# Patient Record
Sex: Male | Born: 1960 | Race: White | Hispanic: No | Marital: Married | Smoking: Never smoker
Health system: Southern US, Community
[De-identification: ages and names within clinical notes are randomized; demographics above are authoritative.]

## PROBLEM LIST (undated history)

## (undated) DIAGNOSIS — T7840XA Allergy, unspecified, initial encounter: Secondary | ICD-10-CM

## (undated) DIAGNOSIS — J329 Chronic sinusitis, unspecified: Secondary | ICD-10-CM

## (undated) DIAGNOSIS — G4730 Sleep apnea, unspecified: Secondary | ICD-10-CM

## (undated) DIAGNOSIS — F329 Major depressive disorder, single episode, unspecified: Secondary | ICD-10-CM

## (undated) DIAGNOSIS — E119 Type 2 diabetes mellitus without complications: Secondary | ICD-10-CM

## (undated) DIAGNOSIS — J302 Other seasonal allergic rhinitis: Secondary | ICD-10-CM

## (undated) DIAGNOSIS — E785 Hyperlipidemia, unspecified: Secondary | ICD-10-CM

## (undated) DIAGNOSIS — F32A Depression, unspecified: Secondary | ICD-10-CM

## (undated) DIAGNOSIS — R351 Nocturia: Secondary | ICD-10-CM

## (undated) DIAGNOSIS — R413 Other amnesia: Secondary | ICD-10-CM

## (undated) DIAGNOSIS — L409 Psoriasis, unspecified: Secondary | ICD-10-CM

## (undated) DIAGNOSIS — I2510 Atherosclerotic heart disease of native coronary artery without angina pectoris: Secondary | ICD-10-CM

## (undated) DIAGNOSIS — I10 Essential (primary) hypertension: Secondary | ICD-10-CM

## (undated) DIAGNOSIS — T8859XA Other complications of anesthesia, initial encounter: Secondary | ICD-10-CM

## (undated) DIAGNOSIS — F419 Anxiety disorder, unspecified: Secondary | ICD-10-CM

## (undated) DIAGNOSIS — F909 Attention-deficit hyperactivity disorder, unspecified type: Secondary | ICD-10-CM

## (undated) DIAGNOSIS — T4145XA Adverse effect of unspecified anesthetic, initial encounter: Secondary | ICD-10-CM

## (undated) HISTORY — DX: Hyperlipidemia, unspecified: E78.5

## (undated) HISTORY — PX: KNEE SURGERY: SHX244

## (undated) HISTORY — PX: COLONOSCOPY: SHX174

## (undated) HISTORY — PX: FINGER SURGERY: SHX640

## (undated) HISTORY — PX: CARDIAC CATHETERIZATION: SHX172

## (undated) HISTORY — DX: Essential (primary) hypertension: I10

## (undated) HISTORY — DX: Atherosclerotic heart disease of native coronary artery without angina pectoris: I25.10

## (undated) HISTORY — PX: APPENDECTOMY: SHX54

## (undated) HISTORY — DX: Allergy, unspecified, initial encounter: T78.40XA

## (undated) HISTORY — DX: Sleep apnea, unspecified: G47.30

## (undated) HISTORY — DX: Type 2 diabetes mellitus without complications: E11.9

---

## 1998-09-17 ENCOUNTER — Encounter: Admission: RE | Admit: 1998-09-17 | Discharge: 1998-12-16 | Payer: Self-pay | Admitting: Family Medicine

## 2000-10-29 ENCOUNTER — Emergency Department (HOSPITAL_COMMUNITY): Admission: EM | Admit: 2000-10-29 | Discharge: 2000-10-30 | Payer: Self-pay | Admitting: Emergency Medicine

## 2000-11-01 ENCOUNTER — Emergency Department (HOSPITAL_COMMUNITY): Admission: EM | Admit: 2000-11-01 | Discharge: 2000-11-01 | Payer: Self-pay | Admitting: Emergency Medicine

## 2003-05-14 ENCOUNTER — Encounter: Payer: Self-pay | Admitting: Emergency Medicine

## 2003-05-14 ENCOUNTER — Emergency Department (HOSPITAL_COMMUNITY): Admission: EM | Admit: 2003-05-14 | Discharge: 2003-05-14 | Payer: Self-pay | Admitting: Emergency Medicine

## 2004-05-24 ENCOUNTER — Encounter: Admission: RE | Admit: 2004-05-24 | Discharge: 2004-05-24 | Payer: Self-pay | Admitting: Family Medicine

## 2007-09-27 ENCOUNTER — Ambulatory Visit: Payer: Self-pay | Admitting: Internal Medicine

## 2007-10-28 ENCOUNTER — Observation Stay (HOSPITAL_COMMUNITY): Admission: EM | Admit: 2007-10-28 | Discharge: 2007-10-29 | Payer: Self-pay | Admitting: Emergency Medicine

## 2008-10-13 HISTORY — PX: HERNIA REPAIR: SHX51

## 2009-11-11 ENCOUNTER — Emergency Department (HOSPITAL_COMMUNITY): Admission: EM | Admit: 2009-11-11 | Discharge: 2009-11-12 | Payer: Self-pay | Admitting: Emergency Medicine

## 2010-10-13 HISTORY — PX: KNEE ARTHROSCOPY: SHX127

## 2010-11-02 ENCOUNTER — Encounter: Payer: Self-pay | Admitting: Family Medicine

## 2010-12-29 LAB — URINALYSIS, ROUTINE W REFLEX MICROSCOPIC
Bilirubin Urine: NEGATIVE
Glucose, UA: >1000 mg/dL — AB
Hgb urine dipstick: NEGATIVE
Ketones, ur: NEGATIVE mg/dL
pH: 6 (ref 5.0–8.0)

## 2010-12-29 LAB — GLUCOSE, CAPILLARY: Glucose-Capillary: 196 mg/dL — ABNORMAL HIGH (ref 70–99)

## 2011-02-25 NOTE — Consult Note (Signed)
NAMEMarland Kitchen  Steve [name], Steve [name]                ACCOUNT NO.:  [id number]   MEDICAL RECORD NO.:  [id number]          PATIENT TYPE:  INP   LOCATION:  3702                         FACILITY:  [facility]   PHYSICIAN:  Lyn Records, M.D.   DATE OF BIRTH:  21-Jun-1961   DATE OF CONSULTATION:  10/29/2007  DATE OF DISCHARGE:                                 CONSULTATION   PRIMARY CARE PHYSICIAN:  Oswaldo Conroy Black, DO   CONCLUSIONS:  1. Intermittent sharp left chest discomfort for several weeks without      precipitants, etiology uncertain.  Myocardial infarction has been      ruled out.  The patient also had 1 episode of burning type left      precordial chest discomfort after arrival in the emergency room      yesterday that lasted less than 10 minutes and has not recurred.  2. Left lower quadrant discomfort.  3. Obesity.  4. Diabetes.  5. Hypertension.   RECOMMENDATIONS:  1. No further cardiac evaluation as an inpatient, as EKGs and markers      have all been negative.  We will perform an outpatient stress test      on the patient within the next week or two.  2. Evaluation of left lower quadrant discomfort.   COMMENTS:  The patient is 50 years of age and states that he had a  cardiac catheterization performed with only minimal luminal  irregularities being found when he was 50 years of age.  He states he  began developing a sharp twinges of chest pain in his anterior left  chest over the past several weeks.  He had the same discomfort  yesterday, and he came to the emergency room.  He states that he came to  the emergency room because he was having recurring discomfort in his  left lower quadrant and groin.  He talked about his chest discomfort in  the emergency room after arriving, and he feels that the attention was  diverted from his left lower quadrant to his chest after he mentioned  chest discomfort.  Shortly after arriving in the ER, he complained of  having some burning tightness in his left  chest that lasted less than 20  to 30 minutes.  There was no shortness of breath, diaphoresis, or other  complaints.   ALLERGIES:  SULFA.   MEDICATIONS:  Actos/metformin 15/850 b.i.d., lisinopril 40 mg per day,  Vytorin 10/80 mg per day, and Lantus 30 units q.h.s.   FAMILY HISTORY:  Positive for CAD in the patient's mother and a maternal  uncle.  Both his mother and father have diabetes.   SOCIAL HISTORY:  Does not smoke or drink.   PAST MEDICAL HISTORY:  Hypertension, hyperlipidemia, obesity, and  diabetes.   PHYSICAL EXAMINATION:  GENERAL:  On exam, the patient is in no distress.  VITAL SIGNS:  His blood pressure is 125/72, heart rate is 76, and O2  saturation is 95%.  HEENT:  Unremarkable.  NECK:  No JVD or carotid bruits.  LUNGS:  Clear.  CARDIAC:  Normal.  No murmur, no  rub, no click, and no gallop.  ABDOMEN:  Soft.  No masses are noted.  There is mild tenderness in the  left lower quadrant.  EXTREMITIES:  Reveal no edema.  Posterior tibial pulses are 2+.  NEUROLOGIC:  Unremarkable.   LABORATORY DATA:  Reveals normal BUN and creatinine of 10 and 0.9,  potassium is 4.3, hemoglobin 14.3, CK-MB and troponin-I negative x2.  Chest x-ray is unremarkable.  EKG is normal.   DISCUSSION:  I do not believe the patient is having an acute coronary  syndrome.  He has had various types of chest discomfort, and noted that  may be cardiac, although he does have risk factors.  He does need to  have a myocardial perfusion study performed.  This could be done as an  outpatient, as his symptoms have been stable for the last several weeks.  The more pressing issue to the patient right now is left lower quadrant  discomfort and needs to be evaluated before he can be discharged from  the hospital.      Lyn Records, M.D.  Electronically Signed     HWS/MEDQ  D:  10/29/2007  T:  10/29/2007  Job:  [id number]

## 2011-02-25 NOTE — H&P (Signed)
NAMEJERICHO, [name]                ACCOUNT NO.:  [id number]   MEDICAL RECORD NO.:  [id number]          PATIENT TYPE:  EMS   LOCATION:  MAJO                         FACILITY:  [facility]   PHYSICIAN:  Hollice Espy, M.D.DATE OF BIRTH:  [birth year]   DATE OF ADMISSION:  10/28/2007  DATE OF DISCHARGE:                              HISTORY & PHYSICAL   PRIMARY CARE PHYSICIAN:  Oswaldo Conroy Black, DO of [facility].   CONSULTATIONS:  [facility].   CHIEF COMPLAINT:  Chest pressure.   HISTORY OF PRESENT ILLNESS:  The patient is a 50 year old white male  with a past medical history of obesity, hyperlipidemia, hypertension,  diabetes mellitus, and a positive CAD from a previous cath done 12 years  ago.  He has had intermittent episodes for the past several weeks of  chest pressure.  He describes it as over his left breast, non-radiating  with no associated shortness of breath or dizziness and not really  associated with any exertion but has continued to occur at times but  usually resolves on its own.  However, today when it happened it  appeared to be more persistent and continuous and he became concerned  and came into the emergency room.  His EKG and chest x-ray were  unremarkable as were his cardiac markers.  However, given his multiple  risk factors and previous diagnosis of CAD based on a previous cath, it  was felt best he come in for further evaluation and treatment.  The  patient is otherwise currently doing well.  He complains of some mild  current chest pressure over his left breast.  It is a little bit better  than when he first came in, although he has not received any  interventions, no aspirin, nitroglycerin, or pain medication yet.  He  was brought in by his wife.   REVIEW OF SYSTEMS:  He otherwise denies any headaches, vision changes,  dysphagia, palpitations, shortness of breath, wheeze, cough, abdominal  pain, hematuria, dysuria, constipation, diarrhea, focal extremity  numbness weakness or pain.  Review of systems otherwise negative.   PAST MEDICAL HISTORY:  1. Diabetes mellitus.  2. Hypertension.  3. Obesity.  4. Hyperlipidemia.  5. He underwent a cardiac catheterization 12 years ago and was told      that he does have some plaque but nothing that required stenting at      the time.   MEDICATIONS:  1. Lipitor.  2. Lantus 30 q.h.s.  3. He cannot recall his blood pressure medications.   ALLERGIES:  SULFA.   SOCIAL HISTORY:  He denies any tobacco, alcohol, or drug use.   FAMILY HISTORY:  Notable only for diabetes mellitus.   PHYSICAL EXAMINATION:  VITAL SIGNS:  Temp 97.7, heart rate 89, blood  pressure 151/93, now down to 140/79, respirations 16, O2 sat 98% on room  air.  GENERAL:  He is alert and oriented x3 in no apparent distress.  HEENT:  Normocephalic atraumatic.  His mucous membranes are moist.  He  has no carotid bruits.  HEART:  Regular rate and rhythm.  S1 S2.  LUNGS:  Clear to auscultation bilaterally.  ABDOMEN:  Soft, obese, nontender.  Positive bowel sounds.  EXTREMITIES:  Show no clubbing, cyanosis, or edema.   LABORATORY:  Chest x-ray is unremarkable.  EKG shows a normal sinus  rhythm.  White count is 6.8, H&H is 13.6 and 40, MCV of 85, platelet  count 292, no shift.  UA unremarkable.  Sodium 136, potassium 4.2,  chloride 106, bicarb 22, BUN 10, creatinine 0.9, glucose 157.  CPK 59.3,  MB 1, troponin I less than 0.05.   ASSESSMENT/PLAN:  1. Unstable angina.  The patient provides a very good history plus he      has multiple risk factors and previously documented mild coronary      artery disease.  We will check 2 more sets of cardiac enzymes and      consult [facility].  We will defer to them to decide whether      to do a cardiac catheterization or a stress test.  2. Hyperlipidemia.  Holding oral medications.  3. Diabetes mellitus.  He will be nothing by mouth after midnight, we      will go to sliding scale only.   Resume Lantus after he is eating by      mouth.  4. Hypertension.  5. Obesity.      Hollice Espy, M.D.  Electronically Signed     SKK/MEDQ  D:  10/28/2007  T:  10/28/2007  Job:  [id number]   cc:   [facility]

## 2011-07-04 LAB — HEMOGLOBIN A1C: Hgb A1c MFr Bld: 8.8 — ABNORMAL HIGH

## 2011-07-04 LAB — I-STAT 8, (EC8 V) (CONVERTED LAB)
BUN: 10
Bicarbonate: 21.8
Glucose, Bld: 157 — ABNORMAL HIGH
Operator id: 261381
pCO2, Ven: 28.6 — ABNORMAL LOW
pH, Ven: 7.49 — ABNORMAL HIGH

## 2011-07-04 LAB — URINALYSIS, ROUTINE W REFLEX MICROSCOPIC
Hgb urine dipstick: NEGATIVE
Protein, ur: NEGATIVE
Urobilinogen, UA: 0.2

## 2011-07-04 LAB — CARDIAC PANEL(CRET KIN+CKTOT+MB+TROPI)
CK, MB: 1.8
Relative Index: 1.6
Total CK: 116
Troponin I: 0.01

## 2011-07-04 LAB — CBC
MCV: 85.3
Platelets: 292
WBC: 6.8

## 2011-07-04 LAB — POCT CARDIAC MARKERS
Myoglobin, poc: 59.3
Operator id: 261381

## 2011-07-04 LAB — DIFFERENTIAL
Basophils Relative: 0
Eosinophils Absolute: 0.1
Lymphs Abs: 2.4
Neutrophils Relative %: 57

## 2012-07-26 ENCOUNTER — Ambulatory Visit (INDEPENDENT_AMBULATORY_CARE_PROVIDER_SITE_OTHER): Payer: BC Managed Care – PPO | Admitting: Family

## 2012-07-26 ENCOUNTER — Encounter: Payer: Self-pay | Admitting: Family

## 2012-07-26 VITALS — BP 122/82 | HR 82 | Temp 97.5°F | Resp 14 | Ht 76.0 in | Wt 257.1 lb

## 2012-07-26 DIAGNOSIS — N529 Male erectile dysfunction, unspecified: Secondary | ICD-10-CM

## 2012-07-26 DIAGNOSIS — IMO0002 Reserved for concepts with insufficient information to code with codable children: Secondary | ICD-10-CM

## 2012-07-26 DIAGNOSIS — Z23 Encounter for immunization: Secondary | ICD-10-CM

## 2012-07-26 DIAGNOSIS — E1140 Type 2 diabetes mellitus with diabetic neuropathy, unspecified: Secondary | ICD-10-CM | POA: Insufficient documentation

## 2012-07-26 DIAGNOSIS — R7989 Other specified abnormal findings of blood chemistry: Secondary | ICD-10-CM

## 2012-07-26 DIAGNOSIS — E291 Testicular hypofunction: Secondary | ICD-10-CM

## 2012-07-26 DIAGNOSIS — M1710 Unilateral primary osteoarthritis, unspecified knee: Secondary | ICD-10-CM

## 2012-07-26 DIAGNOSIS — E785 Hyperlipidemia, unspecified: Secondary | ICD-10-CM

## 2012-07-26 DIAGNOSIS — E119 Type 2 diabetes mellitus without complications: Secondary | ICD-10-CM

## 2012-07-26 DIAGNOSIS — M179 Osteoarthritis of knee, unspecified: Secondary | ICD-10-CM | POA: Insufficient documentation

## 2012-07-26 LAB — BASIC METABOLIC PANEL WITH GFR
BUN: 11 mg/dL (ref 6–23)
Chloride: 104 mEq/L (ref 96–112)
Glucose, Bld: 134 mg/dL — ABNORMAL HIGH (ref 70–99)
Potassium: 4.8 mEq/L (ref 3.5–5.3)

## 2012-07-26 LAB — HEPATIC FUNCTION PANEL
ALT: 35 U/L (ref 0–53)
Alkaline Phosphatase: 51 U/L (ref 39–117)
Bilirubin, Direct: 0.1 mg/dL (ref 0.0–0.3)
Indirect Bilirubin: 0.6 mg/dL (ref 0.0–0.9)
Total Protein: 6.6 g/dL (ref 6.0–8.3)

## 2012-07-26 LAB — LIPID PANEL
HDL: 41 mg/dL (ref >39)
LDL Cholesterol: 54 mg/dL (ref 0–99)
VLDL: 42 mg/dL — ABNORMAL HIGH (ref 0–40)

## 2012-07-26 LAB — HEMOGLOBIN A1C: Hgb A1c MFr Bld: 8.6 % — ABNORMAL HIGH (ref <5.7)

## 2012-07-26 MED ORDER — LISINOPRIL 2.5 MG PO TABS: 2.5000 mg | ORAL_TABLET | Freq: Every day | ORAL | Status: DC

## 2012-07-26 NOTE — Assessment & Plan Note (Signed)
Refer to urology.  ?

## 2012-07-26 NOTE — Progress Notes (Signed)
Subjective:    Patient ID: Steve [name], male    DOB: 1961-09-28, 51 y.o.   MRN: [id number]  HPI  Steve [name] is a 51 yr old male here today to establish care.    DM2- Denies hx of pneumovax.  Wants flu shot. He reports that he is generally compliant with insulin:  NPH- 30-35 units twice a day, metformin. Reports this AM was 178.  Generally under 200.  Reports that his sugars have been as low as 42 about 6 months ago.  Last eye exam was about 6 months ago- reports no diabetic retinopathy.    Hyperlipidemia- on simvastatin.  Denies myalgia.    Hypogonadism-He has been on testosterone therapy- receiving injections every 2 weeks- last dose was >1 month ago.    ED-  Reports no improvement with testosterone therapy, minimal response to cialis and viagra.  DJD- left knee.  He is following with Dr. Yisroel Ramming and is being evaluated for TKR. He would like to establish with Dewaine Conger.  Review of Systems  Constitutional: Negative for unexpected weight change.  HENT: Negative for congestion.   Eyes: Negative for visual disturbance.  Respiratory: Negative for cough and shortness of breath.   Cardiovascular: Negative for chest pain and leg swelling.  Gastrointestinal: Negative for nausea, vomiting and diarrhea.  Genitourinary: Negative for dysuria and frequency.  Musculoskeletal:       Knee pain (worse in the left knee pain)  Skin:       Psoriasis scalp  Neurological:       Occasional headaches  Hematological: Negative for adenopathy.  Psychiatric/Behavioral:       Denies depression/anxiety   Past Medical History  Diagnosis Date  . Diabetes mellitus without complication   . Hypertension   . Hyperlipidemia     History   Social History  . Marital Status: Married    Spouse Name: N/A    Number of Children: 6  . Years of Education: N/A   Occupational History  . Not on file.   Social History Main Topics  . Smoking status: Never Smoker   . Smokeless tobacco: Not on file  .  Alcohol Use: 0.6 - 1.2 oz/week    1-2 Cans of beer per week  . Drug Use: No  . Sexually Active: Yes -- Male partner(s)   Other Topics Concern  . Not on file   Social History Narrative   Regular exercise: very littleCaffeine use: sweet tea daily6 childrenWorks in Holiday representative, owns concession.MarriedEnjoys televition.      Past Surgical History  Procedure Date  . Hernia repair 2010    abdominal hernia  . Knee arthroscopy 2012    left knee    Family History  Problem Relation Age of Onset  . Diabetes Mother   . Cancer Mother     history breast cancer  . Hypertension Mother   . Cancer Father     prostate  . Alzheimer's disease Father   . Diabetes Father   . Hypertension Father   . Cancer Maternal Uncle     colon  . Cancer Maternal Grandmother     breast  . Cancer Maternal Grandfather     prostate  . Cancer Maternal Uncle     prostate  . Cancer Maternal Uncle     prostate    Allergies  Allergen Reactions  . Sulfur Itching    Current Outpatient Prescriptions on File Prior to Visit  Medication Sig Dispense Refill  . insulin NPH (NOVOLIN N)  100 UNIT/ML injection Inject 30-35 Units into the skin 2 (two) times daily before a meal.      . metFORMIN (GLUCOPHAGE) 1000 MG tablet Take 1,000 mg by mouth 2 (two) times daily with a meal.      . simvastatin (ZOCOR) 20 MG tablet Take 20 mg by mouth every evening.      . Sitagliptin-Simvastatin 100-20 MG TABS Take 1 tablet by mouth.      Marland Kitchen lisinopril (PRINIVIL,ZESTRIL) 2.5 MG tablet Take 1 tablet (2.5 mg total) by mouth daily.  30 tablet  1    BP 122/82  Pulse 82  Temp 97.5 F (36.4 C) (Oral)  Resp 14  Ht 6\' 4"  (1.93 m)  Wt 257 lb 1.3 oz (116.611 kg)  BMI 31.29 kg/m2  SpO2 98%       Objective:   Physical Exam  Constitutional: He appears well-developed and well-nourished. No distress.  HENT:  Head: Normocephalic and atraumatic.  Cardiovascular: Normal rate and regular rhythm.   No murmur  heard. Pulmonary/Chest: Effort normal and breath sounds normal. No respiratory distress. He has no wheezes. He has no rales. He exhibits no tenderness.  Musculoskeletal: He exhibits no edema.  Lymphadenopathy:    He has no cervical adenopathy.  Psychiatric: He has a normal mood and affect. His behavior is normal. Judgment and thought content normal.          Assessment & Plan:

## 2012-07-26 NOTE — Assessment & Plan Note (Signed)
Obtain A1C, urine microalbumin.  Add low dose ACE, baby aspirin.  Pneumovax and flu shot today.

## 2012-07-26 NOTE — Assessment & Plan Note (Signed)
Refer to Steve [name] at pt request.

## 2012-07-26 NOTE — Patient Instructions (Addendum)
Please complete your blood work prior to leaving. Follow up in 1 month. Welcome to [place]! 

## 2012-07-26 NOTE — Assessment & Plan Note (Signed)
On statin, obtain flp, lft.

## 2012-07-27 ENCOUNTER — Telehealth: Payer: Self-pay | Admitting: Family

## 2012-07-27 LAB — MICROALBUMIN / CREATININE URINE RATIO: Creatinine, Urine: 216 mg/dL

## 2012-07-27 NOTE — Telephone Encounter (Signed)
Per wife: call cell [phone].  Left message requesting call back.  When pt calls back please let him know that his A1C is above goal.  I would like to switch him to lantus at bedtime and give him some meal coverage prior to each meal.  How many meals a day does he eat? Also need to confirm that he is taking NPH 30-35 units twice daily.  Will call him back with dosing.

## 2012-07-28 MED ORDER — INSULIN GLARGINE 100 UNIT/ML ~~LOC~~ SOLN: SUBCUTANEOUS | Status: DC

## 2012-07-28 NOTE — Telephone Encounter (Signed)
I would recommend that he switch from NPH to lantus.  He should take lantus 48 units at bedtime and increase by 3 units every 3 days until his fasting sugar is <110.   Check sugar 2 hours after largest meal of day.  Call me in 1 week with readings.

## 2012-07-28 NOTE — Telephone Encounter (Signed)
Attempted to reach pt and left message to return my call. 

## 2012-07-28 NOTE — Telephone Encounter (Signed)
Notified pt. He confirmed that he is taking 35 units of NPH twice a day and he eats three meals a day.  Please advise.

## 2012-07-30 MED ORDER — INSULIN GLARGINE 100 UNIT/ML ~~LOC~~ SOLN: SUBCUTANEOUS | Status: DC

## 2012-07-30 NOTE — Telephone Encounter (Signed)
Notified pt and his wife of instructions / below and he voices understanding.

## 2012-07-30 NOTE — Addendum Note (Signed)
Addended by: Mervin Kung A on: 07/30/2012 05:28 PM   Modules accepted: Orders

## 2012-07-30 NOTE — Telephone Encounter (Signed)
Pt's wife called requesting lab results. May be reached at [phone].

## 2012-07-30 NOTE — Telephone Encounter (Signed)
Pt's wife called back requesting that we re-send rx to [facility] on [address] as it is more convenient for them today. Rx cancelled at [facility] pharmacy and sent to [facility]. Notified pt's wife, Bonita Quin.

## 2012-07-30 NOTE — Telephone Encounter (Signed)
Liver function, kidney function normal.  A1C is 8.6.  Goal is <7.0.  Triglycerides are slightly elevated, but when sugar improves, this should also improve.

## 2012-08-10 ENCOUNTER — Telehealth: Payer: Self-pay | Admitting: *Deleted

## 2012-08-10 NOTE — Telephone Encounter (Signed)
Received message from pt's wife stating pt is now in the donut hole and is unable to afford Lantus and Juvisync. Wants to know if there are cheaper alternatives?  Please advise.

## 2012-08-11 MED ORDER — SITAGLIPTIN PHOSPHATE 100 MG PO TABS: 100.0000 mg | ORAL_TABLET | Freq: Every day | ORAL | Status: DC

## 2012-08-11 MED ORDER — SIMVASTATIN 40 MG PO TABS: 40.0000 mg | ORAL_TABLET | Freq: Every day | ORAL | Status: DC

## 2012-08-11 NOTE — Telephone Encounter (Signed)
Please call pt and let him know that I have a 1 month supply of Januvia which is med in Juvisync.  I will increase his simvastatin dose to 40mg  once daily to make up for stopping the Juvisync.  I do not have a good alternative for the Lantus- there is no generic.

## 2012-08-11 NOTE — Telephone Encounter (Signed)
Notified pt's wife and she voices understanding. Januvia samples have been placed at front desk for pick up. She reports that pt's plan only pays up to a certain amount per year and he has met the limit. He will now have to pay out of pocket for medications the remainder of the year.

## 2012-08-20 ENCOUNTER — Ambulatory Visit: Payer: BC Managed Care – PPO | Admitting: Family

## 2012-08-24 ENCOUNTER — Ambulatory Visit: Payer: BC Managed Care – PPO | Admitting: Family

## 2012-08-27 ENCOUNTER — Ambulatory Visit (INDEPENDENT_AMBULATORY_CARE_PROVIDER_SITE_OTHER): Payer: BC Managed Care – PPO | Admitting: Family

## 2012-08-27 ENCOUNTER — Encounter: Payer: Self-pay | Admitting: Family

## 2012-08-27 VITALS — BP 140/82 | HR 83 | Temp 98.0°F | Resp 16 | Wt 259.0 lb

## 2012-08-27 DIAGNOSIS — E119 Type 2 diabetes mellitus without complications: Secondary | ICD-10-CM

## 2012-08-27 DIAGNOSIS — Z23 Encounter for immunization: Secondary | ICD-10-CM

## 2012-08-27 DIAGNOSIS — S61209A Unspecified open wound of unspecified finger without damage to nail, initial encounter: Secondary | ICD-10-CM

## 2012-08-27 DIAGNOSIS — S61009A Unspecified open wound of unspecified thumb without damage to nail, initial encounter: Secondary | ICD-10-CM | POA: Insufficient documentation

## 2012-08-27 MED ORDER — CEPHALEXIN 500 MG PO CAPS: 500.0000 mg | ORAL_CAPSULE | Freq: Four times a day (QID) | ORAL | Status: AC

## 2012-08-27 NOTE — Assessment & Plan Note (Signed)
Improving, but not at goal. Recommend titrate lantus up 3 units every 3 days until fasting sugar is <110.

## 2012-08-27 NOTE — Patient Instructions (Addendum)
Increase lantus by 3 units every 3 days until fasting sugar <110. Follow up in 1 week.

## 2012-08-27 NOTE — Progress Notes (Signed)
Subjective:    Patient ID: Steve [name], male    DOB: 05-25-1961, 51 y.o.   MRN: [id number]  HPI  Steve [name] is a 51 yr old male who presents today with injury to left thumb.  Injury occurred on 11/13.  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  The plantar surface of his thumb was crushed and when he removed his glove he reports that the skin was torn off of the plantar surface of his thumb.  He has been dressing the area.  He has been using ibuprofen as needed for pain.   DM2-  On lantus- fasting, generally 170's.  Currently on 48 units of lantus. Tolerating without difficulty. Denies hypoglycemia.   Review of Systems See HPI  Past Medical History  Diagnosis Date  . Diabetes mellitus without complication   . Hypertension   . Hyperlipidemia     History   Social History  . Marital Status: Married    Spouse Name: N/A    Number of Children: 6  . Years of Education: N/A   Occupational History  . Not on file.   Social History Main Topics  . Smoking status: Never Smoker   . Smokeless tobacco: Not on file  . Alcohol Use: 0.6 - 1.2 oz/week    1-2 Cans of beer per week  . Drug Use: No  . Sexually Active: Yes -- Male partner(s)   Other Topics Concern  . Not on file   Social History Narrative   Regular exercise: very littleCaffeine use: sweet tea daily6 childrenWorks in [employer] representative, owns concession.MarriedEnjoys televition.      Past Surgical History  Procedure Date  . Hernia repair 2010    abdominal hernia  . Knee arthroscopy 2012    left knee    Family History  Problem Relation Age of Onset  . Diabetes Mother   . Cancer Mother     history breast cancer  . Hypertension Mother   . Cancer Father     prostate  . Alzheimer's disease Father   . Diabetes Father   . Hypertension Father   . Cancer Maternal Uncle     colon  . Cancer Maternal Grandmother     breast  . Cancer Maternal Grandfather     prostate  . Cancer Maternal  Uncle     prostate  . Cancer Maternal Uncle     prostate    Allergies  Allergen Reactions  . Sulfur Itching    Current Outpatient Prescriptions on File Prior to Visit  Medication Sig Dispense Refill  . aspirin EC 81 MG tablet Take 81 mg by mouth daily.      . insulin glargine (LANTUS) 100 UNIT/ML injection 48 units injected into skin at bedtime.  Titrate dose as instructed.  10 mL  2  . lisinopril (PRINIVIL,ZESTRIL) 2.5 MG tablet Take 1 tablet (2.5 mg total) by mouth daily.  30 tablet  1  . metFORMIN (GLUCOPHAGE) 1000 MG tablet Take 1,000 mg by mouth 2 (two) times daily with a meal.      . naproxen (NAPROSYN) 500 MG tablet Take 500 mg by mouth 2 (two) times daily with a meal.      . simvastatin (ZOCOR) 40 MG tablet Take 1 tablet (40 mg total) by mouth at bedtime.  30 tablet  2  . sitaGLIPtin (JANUVIA) 100 MG tablet Take 1 tablet (100 mg total) by mouth daily.  28 tablet  0  BP 140/82  Pulse 83  Temp 98 F (36.7 C) (Oral)  Resp 16  Wt 259 lb (117.482 kg)  SpO2 96%       Objective:   Physical Exam  Constitutional: He appears well-developed and well-nourished. No distress.  Cardiovascular: Normal rate and regular rhythm.   No murmur heard. Pulmonary/Chest: Effort normal and breath sounds normal. No respiratory distress. He has no wheezes. He has no rales.  Musculoskeletal: He exhibits no edema.  Skin:       Wound noted on palmar surface of left thumb from nail tip down to joint.  Wound is clean with sanguinous drainage- no odor.           Assessment & Plan:

## 2012-08-27 NOTE — Assessment & Plan Note (Signed)
Wound was cleansed with normal saline and dressed with a non-adhesive dressing.  Will rx empiric keflex and give Tdap today.  Recommended continued daily dressing changes and to call if odor, increased drainage/redness.

## 2012-08-30 ENCOUNTER — Ambulatory Visit: Payer: BC Managed Care – PPO | Admitting: Family

## 2012-08-30 ENCOUNTER — Other Ambulatory Visit: Payer: Self-pay | Admitting: *Deleted

## 2012-08-30 MED ORDER — METFORMIN HCL 1000 MG PO TABS: 1000.0000 mg | ORAL_TABLET | Freq: Two times a day (BID) | ORAL | Status: DC

## 2012-08-30 NOTE — Telephone Encounter (Signed)
Received message from [facility] pharmacy that pt is requesting to use their facility and needs new rx for metformin. Refills sent #60 x 2 refills

## 2012-09-03 ENCOUNTER — Ambulatory Visit: Payer: BC Managed Care – PPO | Admitting: Family

## 2012-10-05 ENCOUNTER — Other Ambulatory Visit: Payer: Self-pay | Admitting: Family

## 2012-10-05 NOTE — Telephone Encounter (Signed)
Rx to pharmacy/[facility] 

## 2012-10-11 NOTE — Pre-Procedure Instructions (Signed)
[address]ADES  10/11/2012   Your procedure is scheduled on:  Wednesday October 20, 2012.  Report to [facility] at 0830 AM.  Call this number if you have problems the morning of surgery: [phone]   Remember:   Do not eat food or drink:After Midnight.    Take these medicines the morning of surgery with A SIP OF WATER: NONE  Do not take any diabetic medications including insulins the morning of surgery   Do not wear jewelry, make-up or nail polish.  Do not wear lotions, powders, or perfumes.   . Men may shave face and neck.  Do not bring valuables to the hospital.  Contacts, dentures or bridgework may not be worn into surgery.  Leave suitcase in the car. After surgery it may be brought to your room.  For patients admitted to the hospital, checkout time is 11:00 AM the day of discharge.   Patients discharged the day of surgery will not be allowed to drive home.  Name and phone number of your driver:   Special Instructions: Shower using CHG 2 nights before surgery and the night before surgery.  If you shower the day of surgery use CHG.  Use special wash - you have one bottle of CHG for all showers.  You should use approximately 1/3 of the bottle for each shower.   Please read over the following fact sheets that you were given: Pain Booklet, Coughing and Deep Breathing, Blood Transfusion Information, Total Joint Packet, MRSA Information and Surgical Site Infection Prevention

## 2012-10-12 ENCOUNTER — Other Ambulatory Visit (HOSPITAL_COMMUNITY): Payer: BC Managed Care – PPO

## 2012-10-12 ENCOUNTER — Encounter (HOSPITAL_COMMUNITY)
Admission: RE | Admit: 2012-10-12 | Discharge: 2012-10-12 | Disposition: A | Discharge status: Home / Self Care | Payer: BC Managed Care – PPO | Source: Ambulatory Visit | Attending: Surgery | Admitting: Surgery

## 2012-10-12 ENCOUNTER — Encounter (HOSPITAL_COMMUNITY)
Admission: RE | Admit: 2012-10-12 | Discharge: 2012-10-12 | Disposition: A | Discharge status: Home / Self Care | Payer: BC Managed Care – PPO | Source: Ambulatory Visit | Attending: Orthopedic Surgery | Admitting: Orthopedic Surgery

## 2012-10-12 ENCOUNTER — Encounter (HOSPITAL_COMMUNITY): Payer: Self-pay

## 2012-10-12 HISTORY — DX: Psoriasis, unspecified: L40.9

## 2012-10-12 HISTORY — DX: Other amnesia: R41.3

## 2012-10-12 HISTORY — DX: Nocturia: R35.1

## 2012-10-12 HISTORY — DX: Adverse effect of unspecified anesthetic, initial encounter: T41.45XA

## 2012-10-12 HISTORY — DX: Other seasonal allergic rhinitis: J30.2

## 2012-10-12 HISTORY — DX: Other complications of anesthesia, initial encounter: T88.59XA

## 2012-10-12 LAB — CBC
HCT: 43.2 % (ref 39.0–52.0)
Platelets: 254 10*3/uL (ref 150–400)
RBC: 5.33 MIL/uL (ref 4.22–5.81)
RDW: 13.1 % (ref 11.5–15.5)
WBC: 6 10*3/uL (ref 4.0–10.5)

## 2012-10-12 LAB — URINALYSIS, ROUTINE W REFLEX MICROSCOPIC
Leukocytes, UA: NEGATIVE
Nitrite: NEGATIVE
Specific Gravity, Urine: 1.028 (ref 1.005–1.030)
pH: 5.5 (ref 5.0–8.0)

## 2012-10-12 LAB — COMPREHENSIVE METABOLIC PANEL
AST: 20 U/L (ref 0–37)
Albumin: 4 g/dL (ref 3.5–5.2)
Alkaline Phosphatase: 57 U/L (ref 39–117)
CO2: 24 mEq/L (ref 19–32)
Chloride: 99 mEq/L (ref 96–112)
Potassium: 4.1 mEq/L (ref 3.5–5.1)
Total Bilirubin: 0.4 mg/dL (ref 0.3–1.2)

## 2012-10-12 LAB — ABO/RH: ABO/RH(D): O NEG

## 2012-10-12 LAB — SURGICAL PCR SCREEN: MRSA, PCR: NEGATIVE

## 2012-10-12 LAB — APTT: aPTT: 31 seconds (ref 24–37)

## 2012-10-12 LAB — TYPE AND SCREEN

## 2012-10-12 NOTE — Progress Notes (Signed)
Patient informed Nurse that he had a stress test and cardiac cath over 10 years ago but no PCI. Patient denied having any cardiac issues thereafter. Patient does not currently have a cardiologist, and patient denied having a sleep study. Wife at chair side.

## 2012-10-14 ENCOUNTER — Encounter (HOSPITAL_COMMUNITY): Payer: Self-pay | Admitting: Vascular Surgery

## 2012-10-14 ENCOUNTER — Other Ambulatory Visit: Payer: Self-pay | Admitting: *Deleted

## 2012-10-14 MED ORDER — SITAGLIPTIN PHOSPHATE 100 MG PO TABS: 100.0000 mg | ORAL_TABLET | Freq: Every day | ORAL | Status: DC

## 2012-10-14 NOTE — Telephone Encounter (Signed)
Refill sent to pharmacy for januvia, #30 x 2 refills.

## 2012-10-14 NOTE — Consult Note (Addendum)
Anesthesia chart review: Patient is a 52 year old male scheduled for left total knee replacement by Dr. Eulah Pont on 10/20/2012. History includes obesity, nonsmoker, hypertension, diabetes mellitus type 2, hyperlipidemia, psoriasis, headaches, nocturia.  He reported a cardiac cath greater than 10 years ago that did not require coronary intervention. For his anesthesia history, he reports night terrors after anesthesia and can be violent when waking up.  EKG on 10/12/2012 showed normal sinus rhythm.  Chest x-ray on 10/12/2012 showed minimal right basilar atelectasis with slight elevation of the right hemidiaphragm. The left lung was clear.  Preoperative labs from 10/12/12 noted.    Epic indicate he is scheduled to see his PCP Sandford Craze, NP for medical clearance on 10/18/2012 @ 1000.  I will leave his chart in the follow-up cabinet for review of his clearance note in Epic when available.  (Update 10/20/11 1000: Reviewed Melissa O'Sullivan's note from 10/18/12.  He also had an ED visit on 10/16/12 for left groin pain.  CT was negative for acute pathology.  No hernia or diverticulitis. Diffuse hepatic steatosis.  Pain thought to be musculoskeletal in nature.)  Shonna Chock, PA-C 10/14/12 1205

## 2012-10-16 ENCOUNTER — Encounter (HOSPITAL_COMMUNITY): Payer: Self-pay | Admitting: *Deleted

## 2012-10-16 ENCOUNTER — Emergency Department (HOSPITAL_COMMUNITY)
Admission: EM | Admit: 2012-10-16 | Discharge: 2012-10-16 | Disposition: A | Discharge status: Home / Self Care | Payer: BC Managed Care – PPO | Attending: Emergency Medicine | Admitting: Emergency Medicine

## 2012-10-16 ENCOUNTER — Emergency Department (HOSPITAL_COMMUNITY): Payer: BC Managed Care – PPO

## 2012-10-16 DIAGNOSIS — Z8719 Personal history of other diseases of the digestive system: Secondary | ICD-10-CM | POA: Insufficient documentation

## 2012-10-16 DIAGNOSIS — R000 Tachycardia, unspecified: Secondary | ICD-10-CM | POA: Insufficient documentation

## 2012-10-16 DIAGNOSIS — R509 Fever, unspecified: Secondary | ICD-10-CM | POA: Insufficient documentation

## 2012-10-16 DIAGNOSIS — I10 Essential (primary) hypertension: Secondary | ICD-10-CM | POA: Insufficient documentation

## 2012-10-16 DIAGNOSIS — Z872 Personal history of diseases of the skin and subcutaneous tissue: Secondary | ICD-10-CM | POA: Insufficient documentation

## 2012-10-16 DIAGNOSIS — E119 Type 2 diabetes mellitus without complications: Secondary | ICD-10-CM | POA: Insufficient documentation

## 2012-10-16 DIAGNOSIS — E785 Hyperlipidemia, unspecified: Secondary | ICD-10-CM | POA: Insufficient documentation

## 2012-10-16 DIAGNOSIS — Z794 Long term (current) use of insulin: Secondary | ICD-10-CM | POA: Insufficient documentation

## 2012-10-16 DIAGNOSIS — R109 Unspecified abdominal pain: Secondary | ICD-10-CM

## 2012-10-16 DIAGNOSIS — R1032 Left lower quadrant pain: Secondary | ICD-10-CM | POA: Insufficient documentation

## 2012-10-16 DIAGNOSIS — Z8679 Personal history of other diseases of the circulatory system: Secondary | ICD-10-CM | POA: Insufficient documentation

## 2012-10-16 DIAGNOSIS — Z87448 Personal history of other diseases of urinary system: Secondary | ICD-10-CM | POA: Insufficient documentation

## 2012-10-16 DIAGNOSIS — Z79899 Other long term (current) drug therapy: Secondary | ICD-10-CM | POA: Insufficient documentation

## 2012-10-16 DIAGNOSIS — Z9189 Other specified personal risk factors, not elsewhere classified: Secondary | ICD-10-CM | POA: Insufficient documentation

## 2012-10-16 LAB — HEPATIC FUNCTION PANEL
Albumin: 4.2 g/dL (ref 3.5–5.2)
Alkaline Phosphatase: 61 U/L (ref 39–117)
Total Protein: 7.4 g/dL (ref 6.0–8.3)

## 2012-10-16 LAB — URINE MICROSCOPIC-ADD ON

## 2012-10-16 LAB — CBC WITH DIFFERENTIAL/PLATELET
Eosinophils Absolute: 0.1 10*3/uL (ref 0.0–0.7)
Lymphocytes Relative: 36 % (ref 12–46)
Lymphs Abs: 2.1 10*3/uL (ref 0.7–4.0)
MCH: 27.7 pg (ref 26.0–34.0)
Neutrophils Relative %: 54 % (ref 43–77)
Platelets: 267 10*3/uL (ref 150–400)
RBC: 5.49 MIL/uL (ref 4.22–5.81)
WBC: 5.9 10*3/uL (ref 4.0–10.5)

## 2012-10-16 LAB — URINALYSIS, ROUTINE W REFLEX MICROSCOPIC
Glucose, UA: >1000 mg/dL — AB
Ketones, ur: NEGATIVE mg/dL
Leukocytes, UA: NEGATIVE
pH: 5.5 (ref 5.0–8.0)

## 2012-10-16 LAB — POCT I-STAT, CHEM 8
BUN: 7 mg/dL (ref 6–23)
Chloride: 101 mEq/L (ref 96–112)
Potassium: 4 mEq/L (ref 3.5–5.1)
Sodium: 139 mEq/L (ref 135–145)

## 2012-10-16 MED ORDER — IOHEXOL 300 MG/ML  SOLN
100.0000 mL | Freq: Once | INTRAMUSCULAR | Status: AC | PRN
  Administered 2012-10-16: 100 mL via INTRAVENOUS

## 2012-10-16 MED ORDER — IOHEXOL 300 MG/ML  SOLN
20.0000 mL | INTRAMUSCULAR | Status: DC
  Administered 2012-10-16: 20 mL via ORAL

## 2012-10-16 MED ORDER — HYDROCODONE-ACETAMINOPHEN 5-325 MG PO TABS: 1.0000 | ORAL_TABLET | Freq: Four times a day (QID) | ORAL | Status: DC | PRN

## 2012-10-16 NOTE — ED Notes (Signed)
Pt transported to CT ?

## 2012-10-16 NOTE — ED Provider Notes (Addendum)
History     CSN: [id number]  Arrival date & time 10/16/12  1401   First MD Initiated Contact with Patient 10/16/12 1559      Chief Complaint  Patient presents with  . Groin Pain    (Consider location/radiation/quality/duration/timing/severity/associated sxs/prior treatment) Patient is a 52 y.o. male presenting with groin pain. The history is provided by the patient.  Groin Pain This is a new problem. The current episode started 2 days ago. The problem occurs constantly. The problem has been gradually worsening. Associated symptoms include abdominal pain. Pertinent negatives include no chest pain and no shortness of breath. The symptoms are aggravated by bending, twisting and walking (Lifting the left leg). Nothing relieves the symptoms. He has tried acetaminophen for the symptoms. The treatment provided no relief.    Past Medical History  Diagnosis Date  . Diabetes mellitus without complication   . Hypertension   . Hyperlipidemia   . Complication of anesthesia     has night terrors after anesthesia but can be violent when waking up  . Headache   . 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 Pt stated "I forget alot of things"  . Frequent urination at night     when blood sugar runs high  . Psoriasis of scalp     nose and face; occurs mainly in winter   . Seasonal allergies     Past Surgical History  Procedure Date  . Hernia repair 2010    abdominal hernia  . Knee arthroscopy 2012    left knee  . Cardiac catheterization     no PCI approx 10 years ago  . Finger surgery     right ring finger  . Knee surgery     Family History  Problem Relation Age of Onset  . Diabetes Mother   . Cancer Mother     history breast cancer  . Hypertension Mother   . Cancer Father     prostate  . Alzheimer's disease Father   . Diabetes Father   . Hypertension Father   . Cancer Maternal Uncle     colon  . Cancer Maternal Grandmother     breast  . Cancer Maternal  Grandfather     prostate  . Cancer Maternal Uncle     prostate  . Cancer Maternal Uncle     prostate    History  Substance Use Topics  . Smoking status: Never Smoker   . Smokeless tobacco: Not on file  . Alcohol Use: 0.6 - 1.2 oz/week    1-2 Cans of beer per week      Review of Systems  Constitutional: Positive for fever. Negative for chills.  Respiratory: Negative for shortness of breath.   Cardiovascular: Negative for chest pain.  Gastrointestinal: Positive for abdominal pain. Negative for nausea, vomiting and diarrhea.  All other systems reviewed and are negative.    Allergies  Sulfur  Home Medications   Current Outpatient Rx  Name  Route  Sig  Dispense  Refill  . ACETAMINOPHEN 500 MG PO TABS   Oral   Take 1,000 mg by mouth 2 (two) times daily as needed. For pain         . FEXOFENADINE-PSEUDOEPHED ER 180-240 MG PO TB24   Oral   Take 1 tablet by mouth daily.         . INSULIN GLARGINE 100 UNIT/ML [place] SOLN   Subcutaneous   Inject 35-40 Units into the skin every  morning.          Marland Kitchen LISINOPRIL 2.5 MG PO TABS   Oral   Take 2.5 mg by mouth daily.         Marland Kitchen METFORMIN HCL 1000 MG PO TABS   Oral   Take 1 tablet (1,000 mg total) by mouth 2 (two) times daily with a meal.   60 tablet   2   . ADULT MULTIVITAMIN W/MINERALS CH   Oral   Take 1 tablet by mouth daily.         Marland Kitchen SIMVASTATIN 40 MG PO TABS   Oral   Take 1 tablet (40 mg total) by mouth at bedtime.   30 tablet   2   . SITAGLIPTIN PHOSPHATE 100 MG PO TABS   Oral   Take 1 tablet (100 mg total) by mouth daily.   30 tablet   2     BP 161/93  Pulse 120  Temp 97.9 F (36.6 C) (Oral)  Resp 18  SpO2 94%  Physical Exam  Nursing note and vitals reviewed. Constitutional: He is oriented to person, place, and time. He appears well-developed and well-nourished. No distress.  HENT:  Head: Normocephalic and atraumatic.  Mouth/Throat: Oropharynx is clear and moist.  Eyes: Conjunctivae normal  and EOM are normal. Pupils are equal, round, and reactive to light.  Neck: Normal range of motion. Neck supple.  Cardiovascular: Regular rhythm and intact distal pulses.  Tachycardia present.   No murmur heard. Pulmonary/Chest: Effort normal and breath sounds normal. No respiratory distress. He has no wheezes. He has no rales.  Abdominal: Soft. Normal appearance. He exhibits no distension. There is tenderness. There is no rebound and no guarding.         No left inguinal hernia palpated however tenderness in the canal  Musculoskeletal: Normal range of motion. He exhibits no edema and no tenderness.  Neurological: He is alert and oriented to person, place, and time.  Skin: Skin is warm and dry. No rash noted. No erythema.  Psychiatric: He has a normal mood and affect. His behavior is normal.    ED Course  Procedures (including critical care time)  Labs Reviewed  URINALYSIS, ROUTINE W REFLEX MICROSCOPIC - Abnormal; Notable for the following:    Glucose, UA >1000 (*)     All other components within normal limits  POCT I-STAT, CHEM 8 - Abnormal; Notable for the following:    Glucose, Bld 262 (*)     All other components within normal limits  CBC WITH DIFFERENTIAL  HEPATIC FUNCTION PANEL  URINE MICROSCOPIC-ADD ON   Ct Abdomen Pelvis W Contrast  10/16/2012  *RADIOLOGY REPORT*  Clinical Data: Left lower quadrant abdominal and inguinal pain. Clinical concern for inguinal hernia or diverticulitis.  Previous hernia repair.  CT ABDOMEN AND PELVIS WITH CONTRAST  Technique:  Multidetector CT imaging of the abdomen and pelvis was performed following the standard protocol during bolus administration of intravenous contrast.  Contrast: OMNIPAQUE IOHEXOL 300 MG/ML  SOLN  Comparison: 10/29/2007.  Findings: Diffuse low density of the liver relative to the spleen. Normal appearing spleen, pancreas, adrenal glands, kidneys, urinary bladder and prostate gland.  Poorly distended gallbladder, grossly  normal.  No gastrointestinal abnormalities or enlarged lymph nodes. No hernia demonstrated.  Clear lung bases.  Lower thoracic spine degenerative changes.  IMPRESSION:  1.  No acute abnormality.  No hernia demonstrated and no evidence of diverticulitis. 2.  Diffuse hepatic steatosis.   Original Report Authenticated By: Beckie Salts,  M.D.      1. Abdominal wall pain       MDM   Patient with left lower quadrant and groin pain that started approximately 2 days ago. It is worse with standing, moving his left lower extremity. He has a history of bilateral inguinal hernia repair several years ago. On exam no palpable hernia of the pain in the inguinal canal also pain in the left lower quadrant. He is neurovascularly intact with a normal pulse in the left lower extremity. No signs of swelling or concern for DVT. UA within normal limits with low suspicion for kidney stone.  CBC, CMP within normal limits. Will do a CT to further evaluate for possible incarcerated inguinal hernia through the mesh versus other etiology.  6:22 PM CT negative for acute pathology. Must be musculoskeletal. Will give pain control patient will followup with his PCP.      Gwyneth Sprout, MD 10/16/12 4782  Gwyneth Sprout, MD 10/16/12 9562

## 2012-10-16 NOTE — ED Notes (Addendum)
Lt. Lower groin pain. Here on wed. For total knee lt. Replacement. Took tylenol for the pain. Only thing he can take now.

## 2012-10-16 NOTE — ED Notes (Signed)
CT notified pt done drinking contrast and ready for CT.

## 2012-10-18 ENCOUNTER — Ambulatory Visit (HOSPITAL_BASED_OUTPATIENT_CLINIC_OR_DEPARTMENT_OTHER)
Admission: RE | Admit: 2012-10-18 | Discharge: 2012-10-18 | Disposition: A | Discharge status: Home / Self Care | Payer: BC Managed Care – PPO | Source: Ambulatory Visit | Attending: Family | Admitting: Family

## 2012-10-18 ENCOUNTER — Encounter: Payer: Self-pay | Admitting: Family

## 2012-10-18 ENCOUNTER — Ambulatory Visit (INDEPENDENT_AMBULATORY_CARE_PROVIDER_SITE_OTHER): Payer: BC Managed Care – PPO | Admitting: Family

## 2012-10-18 VITALS — BP 134/90 | HR 87 | Temp 97.8°F | Resp 16 | Ht 76.0 in | Wt 264.1 lb

## 2012-10-18 DIAGNOSIS — IMO0002 Reserved for concepts with insufficient information to code with codable children: Secondary | ICD-10-CM

## 2012-10-18 DIAGNOSIS — N529 Male erectile dysfunction, unspecified: Secondary | ICD-10-CM

## 2012-10-18 DIAGNOSIS — I10 Essential (primary) hypertension: Secondary | ICD-10-CM | POA: Insufficient documentation

## 2012-10-18 DIAGNOSIS — M1710 Unilateral primary osteoarthritis, unspecified knee: Secondary | ICD-10-CM

## 2012-10-18 DIAGNOSIS — Z01818 Encounter for other preprocedural examination: Secondary | ICD-10-CM

## 2012-10-18 DIAGNOSIS — E119 Type 2 diabetes mellitus without complications: Secondary | ICD-10-CM

## 2012-10-18 DIAGNOSIS — Z01811 Encounter for preprocedural respiratory examination: Secondary | ICD-10-CM | POA: Insufficient documentation

## 2012-10-18 LAB — PROTIME-INR
INR: 0.93 (ref <1.50)
Prothrombin Time: 12.5 seconds (ref 11.6–15.2)

## 2012-10-18 MED ORDER — INSULIN GLARGINE 100 UNIT/ML ~~LOC~~ SOLN: 35.0000 [IU] | Freq: Every morning | SUBCUTANEOUS | Status: DC

## 2012-10-18 NOTE — Assessment & Plan Note (Addendum)
Requested refill on [facility]- I have asked him to request refill from urology.

## 2012-10-18 NOTE — Assessment & Plan Note (Signed)
Pt to resume metformin tonight.

## 2012-10-18 NOTE — Patient Instructions (Addendum)
Please complete your lab work prior to leaving.  Good luck with your upcoming surgery.

## 2012-10-18 NOTE — Assessment & Plan Note (Signed)
He is scheduled for TKR on Wednesday.  He had lab work and EKG performed in the ED which I have reviewed.  Will also add PT/INR.

## 2012-10-18 NOTE — Progress Notes (Signed)
Monday 1630   F/U note placed in chart from M. Peggyann Juba...Marland KitchenDA

## 2012-10-18 NOTE — Progress Notes (Signed)
Subjective:    Patient ID: Steve [name], male    DOB: [birth year], 52 y.o.   MRN: [id number]  HPI  Left lower abdominal pain- throbbing, worse with movement.  Denies burning, frequency, fever.  He was given rx for vicodin which did not help his pain. Was seen in the ED on Saturday and underwent CT of the abdomen.  CT showed no acute changes and it was felt to be musculoskeletal in nature.   TKR-  Pt is scheduled to have left TKR on Wednesday of this week.  Pt to resume metformin tonight.  This was on hold due to his IV contrast given in ED.    Review of Systems See HPI    Past Medical History  Diagnosis Date  . Diabetes mellitus without complication   . Hypertension   . Hyperlipidemia   . Complication of anesthesia     has night terrors after anesthesia but can be violent when waking up  . Headache   . 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 Pt stated "I forget alot of things"  . Frequent urination at night     when blood sugar runs high  . Psoriasis of scalp     nose and face; occurs mainly in winter   . Seasonal allergies     History   Social History  . Marital Status: Married    Spouse Name: N/A    Number of Children: 6  . Years of Education: N/A   Occupational History  . Not on file.   Social History Main Topics  . Smoking status: Never Smoker   . Smokeless tobacco: Not on file  . Alcohol Use: 0.6 - 1.2 oz/week    1-2 Cans of beer per week  . Drug Use: No  . Sexually Active: Yes -- Male partner(s)   Other Topics Concern  . Not on file   Social History Narrative   Regular exercise: very littleCaffeine use: sweet tea daily6 childrenWorks in Holiday representative, owns concession.MarriedEnjoys televition.      Past Surgical History  Procedure Date  . Hernia repair 2010    abdominal hernia  . Knee arthroscopy 2012    left knee  . Cardiac catheterization     no PCI approx 10 years ago  . Finger surgery     right ring finger  . Knee surgery      Family History  Problem Relation Age of Onset  . Diabetes Mother   . Cancer Mother     history breast cancer  . Hypertension Mother   . Cancer Father     prostate  . Alzheimer's disease Father   . Diabetes Father   . Hypertension Father   . Cancer Maternal Uncle     colon  . Cancer Maternal Grandmother     breast  . Cancer Maternal Grandfather     prostate  . Cancer Maternal Uncle     prostate  . Cancer Maternal Uncle     prostate    Allergies  Allergen Reactions  . Sulfur Itching    Current Outpatient Prescriptions on File Prior to Visit  Medication Sig Dispense Refill  . acetaminophen (TYLENOL) 500 MG tablet Take 1,000 mg by mouth 2 (two) times daily as needed. For pain      . fexofenadine-pseudoephedrine (ALLEGRA-D 24) 180-240 MG per 24 hr tablet Take 1 tablet by mouth daily.      Marland Kitchen HYDROcodone-acetaminophen (NORCO/VICODIN) 5-325 MG per tablet  Take 1 tablet by mouth every 6 (six) hours as needed for pain.  10 tablet  0  . insulin glargine (LANTUS) 100 UNIT/ML injection Inject 35-40 Units into the skin every morning.       Marland Kitchen lisinopril (PRINIVIL,ZESTRIL) 2.5 MG tablet Take 2.5 mg by mouth daily.      . Multiple Vitamin (MULTIVITAMIN WITH MINERALS) TABS Take 1 tablet by mouth daily.      . simvastatin (ZOCOR) 40 MG tablet Take 1 tablet (40 mg total) by mouth at bedtime.  30 tablet  2  . sitaGLIPtin (JANUVIA) 100 MG tablet Take 1 tablet (100 mg total) by mouth daily.  30 tablet  2  . metFORMIN (GLUCOPHAGE) 1000 MG tablet Take 1 tablet (1,000 mg total) by mouth 2 (two) times daily with a meal.  60 tablet  2    BP 134/90  Pulse 87  Temp 97.8 F (36.6 C) (Oral)  Resp 16  Ht 6\' 4"  (1.93 m)  Wt 264 lb 1.3 oz (119.786 kg)  BMI 32.14 kg/m2  SpO2 97%    Objective:   Physical Exam  Constitutional: He appears well-developed and well-nourished. No distress.  Cardiovascular: Normal rate and regular rhythm.   No murmur heard. Pulmonary/Chest: Effort normal and  breath sounds normal. No respiratory distress. He has no wheezes. He has no rales. He exhibits no tenderness.  Abdominal: Soft. Bowel sounds are normal. He exhibits no distension and no mass. There is no tenderness. There is no rebound and no guarding.  Musculoskeletal:       + swelling of the left knee  Psychiatric: He has a normal mood and affect. His behavior is normal. Judgment and thought content normal.          Assessment & Plan:

## 2012-10-19 ENCOUNTER — Encounter (HOSPITAL_COMMUNITY): Payer: Self-pay | Admitting: *Deleted

## 2012-10-19 ENCOUNTER — Telehealth: Payer: Self-pay | Admitting: Family

## 2012-10-19 ENCOUNTER — Encounter: Payer: Self-pay | Admitting: Family

## 2012-10-19 ENCOUNTER — Emergency Department (HOSPITAL_COMMUNITY)
Admission: EM | Admit: 2012-10-19 | Discharge: 2012-10-19 | Disposition: A | Discharge status: Home / Self Care | Payer: BC Managed Care – PPO | Attending: Emergency Medicine | Admitting: Emergency Medicine

## 2012-10-19 DIAGNOSIS — R21 Rash and other nonspecific skin eruption: Secondary | ICD-10-CM | POA: Insufficient documentation

## 2012-10-19 DIAGNOSIS — Z79899 Other long term (current) drug therapy: Secondary | ICD-10-CM | POA: Insufficient documentation

## 2012-10-19 DIAGNOSIS — B029 Zoster without complications: Secondary | ICD-10-CM | POA: Insufficient documentation

## 2012-10-19 DIAGNOSIS — M79609 Pain in unspecified limb: Secondary | ICD-10-CM | POA: Insufficient documentation

## 2012-10-19 DIAGNOSIS — Z87448 Personal history of other diseases of urinary system: Secondary | ICD-10-CM | POA: Insufficient documentation

## 2012-10-19 DIAGNOSIS — E119 Type 2 diabetes mellitus without complications: Secondary | ICD-10-CM | POA: Insufficient documentation

## 2012-10-19 DIAGNOSIS — Z8659 Personal history of other mental and behavioral disorders: Secondary | ICD-10-CM | POA: Insufficient documentation

## 2012-10-19 DIAGNOSIS — I10 Essential (primary) hypertension: Secondary | ICD-10-CM | POA: Insufficient documentation

## 2012-10-19 MED ORDER — OXYCODONE-ACETAMINOPHEN 5-325 MG PO TABS
2.0000 | ORAL_TABLET | Freq: Once | ORAL | Status: AC
  Administered 2012-10-19: 2 via ORAL
  Filled 2012-10-19: qty 2

## 2012-10-19 MED ORDER — VALACYCLOVIR HCL 1 G PO TABS: 1000.0000 mg | ORAL_TABLET | Freq: Three times a day (TID) | ORAL | Status: AC

## 2012-10-19 MED ORDER — OXYCODONE-ACETAMINOPHEN 5-325 MG PO TABS: 1.0000 | ORAL_TABLET | Freq: Four times a day (QID) | ORAL | Status: DC | PRN

## 2012-10-19 MED ORDER — CEFAZOLIN SODIUM-DEXTROSE 2-3 GM-% IV SOLR: 2.0000 g | INTRAVENOUS | Status: DC

## 2012-10-19 NOTE — ED Provider Notes (Signed)
History     CSN: [id number]  Arrival date & time 10/19/12  1209   First MD Initiated Contact with Patient 10/19/12 1220      Chief Complaint  Patient presents with  . Back Pain  . Leg Pain    (Consider location/radiation/quality/duration/timing/severity/associated sxs/prior treatment) Patient is a 52 y.o. male presenting with back pain and leg pain. The history is provided by the patient.  Back Pain  Associated symptoms include leg pain. Pertinent negatives include no chest pain, no fever, no headaches and no abdominal pain.  Leg Pain   pt c/o pain to left groin, and left medial thigh for past 3 days. In past 1 day has noted a erythematous lesion to medial, proximal aspect left thigh. No hx same. No other rash/lesions. No nv. No fever or chills. Pain constant. No specific exacerbating or alleviating factors. Pain mod/severe.       Past Medical History  Diagnosis Date  . Diabetes mellitus without complication   . Hypertension   . Hyperlipidemia   . Complication of anesthesia     has night terrors after anesthesia but can be violent when waking up  . Headache   . 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 Pt stated "I forget alot of things"  . Frequent urination at night     when blood sugar runs high  . Psoriasis of scalp     nose and face; occurs mainly in winter   . Seasonal allergies     Past Surgical History  Procedure Date  . Hernia repair 2010    abdominal hernia  . Knee arthroscopy 2012    left knee  . Cardiac catheterization     no PCI approx 10 years ago  . Finger surgery     right ring finger  . Knee surgery     Family History  Problem Relation Age of Onset  . Diabetes Mother   . Cancer Mother     history breast cancer  . Hypertension Mother   . Cancer Father     prostate  . Alzheimer's disease Father   . Diabetes Father   . Hypertension Father   . Cancer Maternal Uncle     colon  . Cancer Maternal Grandmother     breast  .  Cancer Maternal Grandfather     prostate  . Cancer Maternal Uncle     prostate  . Cancer Maternal Uncle     prostate    History  Substance Use Topics  . Smoking status: Never Smoker   . Smokeless tobacco: Not on file  . Alcohol Use: 0.6 - 1.2 oz/week    1-2 Cans of beer per week      Review of Systems  Constitutional: Negative for fever.  HENT: Negative for neck pain.   Eyes: Negative for redness.  Respiratory: Negative for shortness of breath.   Cardiovascular: Negative for chest pain.  Gastrointestinal: Negative for abdominal pain.  Genitourinary: Negative for flank pain.  Musculoskeletal: Positive for back pain.  Skin: Positive for rash.  Neurological: Negative for headaches.  Hematological: Does not bruise/bleed easily.  Psychiatric/Behavioral: Negative for confusion.    Allergies  Sulfur  Home Medications   Current Outpatient Rx  Name  Route  Sig  Dispense  Refill  . ACETAMINOPHEN 500 MG PO TABS   Oral   Take 1,000 mg by mouth 2 (two) times daily as needed. For pain         .  FEXOFENADINE-PSEUDOEPHED ER 180-240 MG PO TB24   Oral   Take 1 tablet by mouth daily.         Marland Kitchen HYDROCODONE-ACETAMINOPHEN 5-325 MG PO TABS   Oral   Take 1 tablet by mouth every 6 (six) hours as needed.         . INSULIN GLARGINE 100 UNIT/ML [place] SOLN   Subcutaneous   Inject 35-40 Units into the skin at bedtime. Sliding scale         . LISINOPRIL 2.5 MG PO TABS   Oral   Take 2.5 mg by mouth daily.         Marland Kitchen METFORMIN HCL 1000 MG PO TABS   Oral   Take 1 tablet (1,000 mg total) by mouth 2 (two) times daily with a meal.   60 tablet   2   . ADULT MULTIVITAMIN W/MINERALS CH   Oral   Take 1 tablet by mouth daily.         Marland Kitchen SIMVASTATIN 40 MG PO TABS   Oral   Take 1 tablet (40 mg total) by mouth at bedtime.   30 tablet   2   . SITAGLIPTIN PHOSPHATE 100 MG PO TABS   Oral   Take 1 tablet (100 mg total) by mouth daily.   30 tablet   2     BP 157/90  Pulse 98   Temp 97.7 F (36.5 C) (Oral)  Resp 18  SpO2 97%  Physical Exam  Nursing note and vitals reviewed. Constitutional: He is oriented to person, place, and time. He appears well-developed and well-nourished. No distress.  HENT:  Head: Atraumatic.  Eyes: Pupils are equal, round, and reactive to light.  Neck: Neck supple. No tracheal deviation present.  Cardiovascular: Normal rate.   Pulmonary/Chest: Effort normal. No accessory muscle usage. No respiratory distress.  Abdominal: Soft. Bowel sounds are normal. He exhibits no distension. There is no tenderness.  Musculoskeletal: Normal range of motion. He exhibits no edema and no tenderness.  Neurological: He is alert and oriented to person, place, and time.  Skin: Skin is warm and dry.       Erythematous, vesicular rash single dermatone, left proximal thigh, c/w shingles. No cellulitis. Distal pulses palp.   Psychiatric: He has a normal mood and affect.    ED Course  Procedures (including critical care time)  Labs Reviewed - No data to display Dg Chest 2 View  10/18/2012  *RADIOLOGY REPORT*  Clinical Data: Preoperative respiratory exam for orthopedic surgery.  History of hypertension and diabetes.  CHEST - 2 VIEW  Comparison: 10/12/2012  Findings: Heart size is normal.  Mediastinal shadows are normal. Lungs are clear.  No effusions.  No bony abnormalities.  IMPRESSION: Normal chest.   Original Report Authenticated By: Paulina Fusi, M.D.        MDM  Percocet 2 po. Pt has ride, does not have to drive.  Pt states lesions/rash just started in past day, therefore will rx w valtrex and pain medication (pt w normal renal fxn on recent labs).          Suzi Roots, MD 10/19/12 1253

## 2012-10-19 NOTE — Telephone Encounter (Signed)
I see that he was diagnosed with shingles today.  His surgery should be postponed until lesions are resolved.  Please notify pt and surgeon's office.  He should follow up with me in 10 days please.

## 2012-10-19 NOTE — H&P (Signed)
  [facility] [address]   [address] [place], [place] [address] [phone] A Division of [facility]  Loreta Ave, M.D.   Robert A. Thurston Hole, M.D.   Burnell Blanks, M.D.   Eulas Post, M.D.   Lunette Stands, M.D Buford Dresser, M.D.  Charlsie Quest, M.D.   Estell Harpin, M.D.   Melina Fiddler, M.D. Genene Churn. Barry Dienes, PA-C            Kirstin A. Shepperson, PA-C Josh [place], PA-C [place], [place]   RE: Steve [name], Steve [name]                                [phone]      DOB: [birth year] PROGRESS NOTE: 10-12-12 87 nine year-old white male with a history of end stage DJD, left knee, and pain.  Returns.  States that knee symptoms are unchanged from previous visit.  He is wanting to proceed with left total knee replacement scheduled.   Current medications: Metformin, Simvastatin, insulin and Naproxen. Allergies: Sulfa. Past medical/surgical history: Diabetes, hypercholesterolemia, left knee arthroscopy x 2, hernia repair and hypertension. Review of systems: Patient admits to having several headaches over the last week or so.  Denies history of migraines.  No fevers, chills, chest pain, shortness of breath, GI or GU issues.  Family history: Positive for diabetes and hypertension.   Social history: Denies smoking, admits occasional alcohol use.  Patient is married and self-employed doing Holiday representative.    EXAMINATION: Height: 6?4.  Weight: 264 pounds.  Respirations: 20.  Temperature: 98.1.  Blood pressure: 131/84.  Pulse: 97.  Pleasant white male, alert and oriented x 3 and in no acute distress.  Gait is antalgic.  No increase in respiratory effort. Head is normocephalic, a traumatic.  PERRLA, EOMI.  Lungs: CTA bilaterally.  No wheezes.  Heart: RRR.  No murmurs.  Abdomen: Round and non-distended.  NBS x 4.  Soft and non-tender.  Left knee: Decreased range of motion.  Positive effusion.  Positive crepitus.  Joint line  tender.  Ligaments stable.  Calf non-tender.  Neurovascularly intact.  Skin warm and dry.    X-RAYS: Previous left knee films show end stage DJD with periarticular spurs.    IMPRESSION: End stage DJD, left knee, and chronic pain.  PLAN: We will proceed with left total knee replacement as scheduled.  Surgical procedure, along with potential rehab/recovery time discussed.  All questions answered.  In regards to his headaches, I advised him to discuss this with Sandford Craze, Family Nurse Practitioner.  Hopefully he will have this checked out before his surgery.  All questions answered.    Genene Churn. Barry Dienes, PA-C   Electronically verified by Loreta Ave, M.D. JMO:jjh Cc: Sandford Craze, FNP, fax: [phone] D 10-12-12 T 10-14-12

## 2012-10-19 NOTE — ED Notes (Signed)
Pt is here with lower back pain and left upper thigh and groin pain and was seen here on Saturday for same symptoms.  Pt has an area inside his left thigh that he thinks may be a spider bite

## 2012-10-19 NOTE — Telephone Encounter (Signed)
Spoke with Dr Eulah Pont. "Shingles appears to be up on pt's thigh, there doesn't appear to be any super imposed bacterial infection". Per Dr Eulah Pont, pt has started Valtrex and wishes to proceed with surgery; anesthesia has cleared pt for surgery. Dr Eulah Pont will see pt in the office tomorrow and if outbreak has not worsened he will proceed with surgery.

## 2012-10-19 NOTE — ED Notes (Signed)
Vesicular-appearing rash noted to left groin extending onto medial and dorsal aspects of left thigh areas; pt endorses pain to LLQ of abd rad into left thigh; ambulates with minimal difficulty

## 2012-10-20 ENCOUNTER — Encounter (HOSPITAL_COMMUNITY)
Admission: RE | Disposition: A | Discharge status: Home / Self Care | Payer: Self-pay | Source: Ambulatory Visit | Attending: Orthopedic Surgery

## 2012-10-20 ENCOUNTER — Encounter (HOSPITAL_COMMUNITY)
Admission: RE | Admit: 2012-10-20 | Discharge: 2012-10-20 | Disposition: A | Discharge status: Home / Self Care | Payer: BC Managed Care – PPO | Source: Ambulatory Visit | Attending: Orthopedic Surgery | Admitting: Orthopedic Surgery

## 2012-10-20 SURGERY — CANCELLED PROCEDURE
Laterality: Left

## 2012-10-20 SURGICAL SUPPLY — 54 items
BANDAGE ESMARK 6X9 LF (GAUZE/BANDAGES/DRESSINGS) ×1 IMPLANT
BLADE SAG 18X100X1.27 (BLADE) ×4 IMPLANT
BNDG ESMARK 6X9 LF (GAUZE/BANDAGES/DRESSINGS) ×2
BOOTCOVER CLEANROOM LRG (PROTECTIVE WEAR) ×4 IMPLANT
BOWL SMART MIX CTS (DISPOSABLE) ×2 IMPLANT
CLOTH BEACON ORANGE TIMEOUT ST (SAFETY) ×2 IMPLANT
COVER BACK TABLE 24X17X13 BIG (DRAPES) ×2 IMPLANT
COVER SURGICAL LIGHT HANDLE (MISCELLANEOUS) ×2 IMPLANT
CUFF TOURNIQUET SINGLE 34IN LL (TOURNIQUET CUFF) ×2 IMPLANT
DRAPE EXTREMITY T 121X128X90 (DRAPE) ×2 IMPLANT
DRAPE PROXIMA HALF (DRAPES) ×2 IMPLANT
DRAPE U-SHAPE 47X51 STRL (DRAPES) ×2 IMPLANT
DRSG PAD ABDOMINAL 8X10 ST (GAUZE/BANDAGES/DRESSINGS) ×2 IMPLANT
DURAPREP 26ML APPLICATOR (WOUND CARE) ×2 IMPLANT
ELECT CAUTERY BLADE 6.4 (BLADE) ×2 IMPLANT
ELECT REM PT RETURN 9FT ADLT (ELECTROSURGICAL) ×2
ELECTRODE REM PT RTRN 9FT ADLT (ELECTROSURGICAL) ×1 IMPLANT
EVACUATOR 1/8 PVC DRAIN (DRAIN) ×2 IMPLANT
FACESHIELD LNG OPTICON STERILE (SAFETY) ×2 IMPLANT
GAUZE XEROFORM 5X9 LF (GAUZE/BANDAGES/DRESSINGS) ×2 IMPLANT
GLOVE BIOGEL PI IND STRL 8 (GLOVE) ×1 IMPLANT
GLOVE BIOGEL PI INDICATOR 8 (GLOVE) ×1
GLOVE ORTHO TXT STRL SZ7.5 (GLOVE) ×2 IMPLANT
GOWN PREVENTION PLUS XLARGE (GOWN DISPOSABLE) ×4 IMPLANT
GOWN STRL NON-REIN LRG LVL3 (GOWN DISPOSABLE) ×4 IMPLANT
GOWN STRL REIN 2XL XLG LVL4 (GOWN DISPOSABLE) ×2 IMPLANT
HANDPIECE INTERPULSE COAX TIP (DISPOSABLE) ×1
IMMOBILIZER KNEE 22 UNIV (SOFTGOODS) ×2 IMPLANT
IMMOBILIZER KNEE 24 THIGH 36 (MISCELLANEOUS) IMPLANT
IMMOBILIZER KNEE 24 UNIV (MISCELLANEOUS)
KIT BASIN OR (CUSTOM PROCEDURE TRAY) ×2 IMPLANT
KIT ROOM TURNOVER OR (KITS) ×2 IMPLANT
MANIFOLD NEPTUNE II (INSTRUMENTS) ×2 IMPLANT
NS IRRIG 1000ML POUR BTL (IV SOLUTION) ×2 IMPLANT
PACK TOTAL JOINT (CUSTOM PROCEDURE TRAY) ×2 IMPLANT
PAD ARMBOARD 7.5X6 YLW CONV (MISCELLANEOUS) ×4 IMPLANT
PAD CAST 4YDX4 CTTN HI CHSV (CAST SUPPLIES) ×1 IMPLANT
PADDING CAST COTTON 4X4 STRL (CAST SUPPLIES) ×1
PADDING CAST COTTON 6X4 STRL (CAST SUPPLIES) ×2 IMPLANT
RUBBERBAND STERILE (MISCELLANEOUS) ×2 IMPLANT
SET HNDPC FAN SPRY TIP SCT (DISPOSABLE) ×1 IMPLANT
SPONGE GAUZE 4X4 12PLY (GAUZE/BANDAGES/DRESSINGS) ×2 IMPLANT
STAPLER VISISTAT 35W (STAPLE) ×2 IMPLANT
SUCTION FRAZIER TIP 10 FR DISP (SUCTIONS) ×2 IMPLANT
SUT VIC AB 1 CTX 36 (SUTURE) ×2
SUT VIC AB 1 CTX36XBRD ANBCTR (SUTURE) ×2 IMPLANT
SUT VIC AB 2-0 CT1 27 (SUTURE) ×2
SUT VIC AB 2-0 CT1 TAPERPNT 27 (SUTURE) ×2 IMPLANT
SYR 30ML LL (SYRINGE) ×2 IMPLANT
SYR 30ML SLIP (SYRINGE) ×2 IMPLANT
TOWEL OR 17X24 6PK STRL BLUE (TOWEL DISPOSABLE) ×2 IMPLANT
TOWEL OR 17X26 10 PK STRL BLUE (TOWEL DISPOSABLE) ×2 IMPLANT
TRAY FOLEY CATH 14FR (SET/KITS/TRAYS/PACK) ×2 IMPLANT
WATER STERILE IRR 1000ML POUR (IV SOLUTION) ×6 IMPLANT

## 2012-10-20 NOTE — Interval H&P Note (Signed)
History and Physical Interval Note:  10/20/2012 8:22 AM  Steve [name]  has presented today for surgery, with the diagnosis of DJD LEFT KNEE  The various methods of treatment have been discussed with the patient and family. After consideration of risks, benefits and other options for treatment, the patient has consented to  Procedure(s) (LRB) with comments: TOTAL KNEE ARTHROPLASTY (Left) as a surgical intervention .  The patient's history has been reviewed, patient examined, no change in status, stable for surgery.  I have reviewed the patient's chart and labs.  Questions were answered to the patient's satisfaction.     MURPHY,DANIEL F

## 2012-11-02 ENCOUNTER — Encounter: Payer: Self-pay | Admitting: Family

## 2012-11-02 ENCOUNTER — Ambulatory Visit (INDEPENDENT_AMBULATORY_CARE_PROVIDER_SITE_OTHER): Payer: BC Managed Care – PPO | Admitting: Family

## 2012-11-02 VITALS — BP 120/72 | HR 112 | Temp 98.1°F | Resp 16 | Wt 258.1 lb

## 2012-11-02 DIAGNOSIS — B029 Zoster without complications: Secondary | ICD-10-CM

## 2012-11-02 MED ORDER — GABAPENTIN 100 MG PO CAPS: 100.0000 mg | ORAL_CAPSULE | Freq: Three times a day (TID) | ORAL | Status: DC

## 2012-11-02 NOTE — Patient Instructions (Addendum)
Please call if pain worsens or if no improvement in pain with gabapentin. You can try Capsaicin cream which you can purchase over the counter for the leg rash/pain. Try hydrocortisone 1% as needed for skin rash. Follow up in 6 weeks.

## 2012-11-02 NOTE — Progress Notes (Signed)
Subjective:    Patient ID: Steve [name], male    DOB: 1961-05-16, 52 y.o.   MRN: [id number]  HPI  Steve [name] is a 52 yr old male who presents today for follow up of his Herpes Zoster.  He reports that oxycodone helps the most.  He continues to have healing rash and burning pain in the affected area. Knee surgery was postponed due to pain.  Review of Systems See HPI  Past Medical History  Diagnosis Date  . Diabetes mellitus without complication   . Hypertension   . Hyperlipidemia   . Complication of anesthesia     has night terrors after anesthesia but can be violent when waking up  . Headache   . 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 Pt stated "I forget alot of things"  . Frequent urination at night     when blood sugar runs high  . Psoriasis of scalp     nose and face; occurs mainly in winter   . Seasonal allergies     History   Social History  . Marital Status: Married    Spouse Name: N/A    Number of Children: 6  . Years of Education: N/A   Occupational History  . Not on file.   Social History Main Topics  . Smoking status: Never Smoker   . Smokeless tobacco: Not on file  . Alcohol Use: 0.6 - 1.2 oz/week    1-2 Cans of beer per week  . Drug Use: No  . Sexually Active: Yes -- Male partner(s)   Other Topics Concern  . Not on file   Social History Narrative   Regular exercise: very littleCaffeine use: sweet tea daily6 childrenWorks in Holiday representative, owns concession.MarriedEnjoys televition.      Past Surgical History  Procedure Date  . Hernia repair 2010    abdominal hernia  . Knee arthroscopy 2012    left knee  . Cardiac catheterization     no PCI approx 10 years ago  . Finger surgery     right ring finger  . Knee surgery     Family History  Problem Relation Age of Onset  . Diabetes Mother   . Cancer Mother     history breast cancer  . Hypertension Mother   . Cancer Father     prostate  . Alzheimer's disease Father   .  Diabetes Father   . Hypertension Father   . Cancer Maternal Uncle     colon  . Cancer Maternal Grandmother     breast  . Cancer Maternal Grandfather     prostate  . Cancer Maternal Uncle     prostate  . Cancer Maternal Uncle     prostate    Allergies  Allergen Reactions  . Sulfur Itching    Current Outpatient Prescriptions on File Prior to Visit  Medication Sig Dispense Refill  . acetaminophen (TYLENOL) 500 MG tablet Take 1,000 mg by mouth 2 (two) times daily as needed. For pain      . fexofenadine-pseudoephedrine (ALLEGRA-D 24) 180-240 MG per 24 hr tablet Take 1 tablet by mouth daily.      . insulin glargine (LANTUS) 100 UNIT/ML injection Inject 35-40 Units into the skin at bedtime. Sliding scale      . lisinopril (PRINIVIL,ZESTRIL) 2.5 MG tablet Take 2.5 mg by mouth daily.      . metFORMIN (GLUCOPHAGE) 1000 MG tablet Take 1 tablet (1,000 mg total) by mouth  2 (two) times daily with a meal.  60 tablet  2  . Multiple Vitamin (MULTIVITAMIN WITH MINERALS) TABS Take 1 tablet by mouth daily.      Marland Kitchen oxyCODONE-acetaminophen (PERCOCET/ROXICET) 5-325 MG per tablet Take 1-2 tablets by mouth every 6 (six) hours as needed for pain.  30 tablet  0  . simvastatin (ZOCOR) 40 MG tablet Take 1 tablet (40 mg total) by mouth at bedtime.  30 tablet  2  . sitaGLIPtin (JANUVIA) 100 MG tablet Take 1 tablet (100 mg total) by mouth daily.  30 tablet  2  . valACYclovir (VALTREX) 1000 MG tablet Take 1 tablet (1,000 mg total) by mouth 3 (three) times daily.  21 tablet  0  . gabapentin (NEURONTIN) 100 MG capsule Take 1 capsule (100 mg total) by mouth 3 (three) times daily.  90 capsule  2    BP 120/72  Pulse 112  Temp 98.1 F (36.7 C) (Oral)  Resp 16  Wt 258 lb 1.3 oz (117.064 kg)  SpO2 96%       Objective:   Physical Exam  Constitutional: He appears well-developed and well-nourished. No distress.  Cardiovascular: Normal rate and regular rhythm.   Pulmonary/Chest: Effort normal and breath sounds  normal.          Assessment & Plan:

## 2012-11-05 DIAGNOSIS — B029 Zoster without complications: Secondary | ICD-10-CM | POA: Insufficient documentation

## 2012-11-05 NOTE — Assessment & Plan Note (Signed)
Resolving, but now having some post-herpetic neuralgia. Recommended trial of neurontin and capsaicin cream.

## 2012-11-13 NOTE — Pre-Procedure Instructions (Signed)
ARIANNA DELSANTO  11/13/2012   Your procedure is scheduled on:  11/24/2012  Report to [facility] at 9:45 AM.  Call this number if you have problems the morning of surgery: [phone]   Remember:   Do not eat food or drink liquids after midnight.  On TUESDAY   Take these medicines the morning of surgery with A SIP OF WATER:gabapentin, pain med, NO BLOOD SUGAR MED IR INSULIN AM OF SURG.    Do not wear jewelry  Do not wear lotions, powders, or perfumes. You may NOT wear deodorant.            Men may shave face and neck.  Do not bring valuables to the hospital.  Contacts, dentures or bridgework may not be worn into surgery.  Leave suitcase in the car. After surgery it may be brought to your room.  For patients admitted to the hospital, checkout time is 11:00 AM the day of  discharge.   Patients discharged the day of surgery will not be allowed to drive  home.  Name and phone number of your driver:  Special Instructions: Shower using CHG 2 nights before surgery and the night before surgery.  If you shower the day of surgery use CHG.  Use special wash - you have one bottle of CHG for all showers.  You should use approximately 1/3 of the bottle for each shower. INCENTIVE SPIROMETRY INST SHEET   Please read over the following fact sheets that you were given: Pain Booklet, Coughing and Deep Breathing, Blood Transfusion Information, MRSA Information and Surgical Site Infection Prevention

## 2012-11-15 ENCOUNTER — Encounter (HOSPITAL_COMMUNITY)
Admission: RE | Admit: 2012-11-15 | Discharge: 2012-11-15 | Disposition: A | Discharge status: Home / Self Care | Payer: BC Managed Care – PPO | Source: Ambulatory Visit | Attending: Orthopedic Surgery | Admitting: Orthopedic Surgery

## 2012-11-15 NOTE — Progress Notes (Addendum)
CALLED OFFICE TALKED WITH JAMES PA FOR ORDERS. PATIENT DID NOT SHOW FOR PREADMIT CALLED HIM, STATED WAS GOING TO PUT OFF SURGERY FOR A MONTH. CALLED JAMES PA AT OFFICE AND TOLD HIM.

## 2012-11-17 ENCOUNTER — Ambulatory Visit (INDEPENDENT_AMBULATORY_CARE_PROVIDER_SITE_OTHER): Payer: BC Managed Care – PPO | Admitting: Internal Medicine

## 2012-11-17 ENCOUNTER — Encounter: Payer: Self-pay | Admitting: Internal Medicine

## 2012-11-17 VITALS — BP 136/90 | HR 103 | Temp 97.9°F | Wt 259.0 lb

## 2012-11-17 DIAGNOSIS — J209 Acute bronchitis, unspecified: Secondary | ICD-10-CM

## 2012-11-17 MED ORDER — HYDROCODONE-HOMATROPINE 5-1.5 MG/5ML PO SYRP: 5.0000 mL | ORAL_SOLUTION | Freq: Four times a day (QID) | ORAL | Status: DC | PRN

## 2012-11-17 MED ORDER — AMOXICILLIN 500 MG PO CAPS: 500.0000 mg | ORAL_CAPSULE | Freq: Three times a day (TID) | ORAL | Status: DC

## 2012-11-17 NOTE — Patient Instructions (Addendum)
Plain Mucinex (NOT D) for thick secretions ;force NON dairy fluids .   Nasal cleansing in the shower as discussed with lather of mild shampoo.After 10 seconds wash off lather while  exhaling through nostrils. Make sure that all residual soap is removed to prevent irritation.  Use a Neti pot daily only  as needed for significant sinus congestion; going from open side to congested side . Plain Allegra (NOT D )  160 daily , Loratidine 10 mg , OR Zyrtec 10 mg @ bedtime  as needed for itchy eyes & sneezing.     

## 2012-11-17 NOTE — Progress Notes (Signed)
  Subjective:    Patient ID: Steve [name], male    DOB: 10-Jun-1961, 52 y.o.   MRN: [id number]  HPI The respiratory tract symptoms began 11/13/12 as  head congestion &  cough with  Green- yellow   sputum.  His wife has been ill with similar illness  Significant active  associated symptoms include frontal headache, facial pain, & slight sore throat.Cough is  associated with  Green sputum & shortness of breath but not wheezing .  Flu shot  Current. Treatment with  Mucinex & Nyquil was partially effective   There is no history of asthma ; PMH of  perennial allergies. The patient had never smoked .  He states his diabetic control has been "good" with fasting blood sugars averaging 190 x 3 days ; typically 130-150. Previously sugars have been 200-300                 Review of Systems Symptoms not present include  dental pain,  nasal purulence, earache , and otic discharge Fever,chills & sweats not present  Itchy , watery eyes & sneezing were not noted. Myalgias and arthralgias were not present        Objective:   Physical Exam General appearance:well nourished; no acute distress or increased work of breathing is present.  No  lymphadenopathy about the head, neck, or axilla noted.  Eyes: No conjunctival inflammation or lid edema is present.  Ears:  External ear exam shows no significant lesions or deformities.  Otoscopic examination reveals clear canals, tympanic membranes are intact bilaterally without bulging, retraction, inflammation or discharge. Minor scarring right TM inferiorly Nose:  External nasal examination shows no deformity or inflammation. Nasal mucosa are pink and moist without lesions or exudates. No septal dislocation or deviation.No obstruction to airflow.  Oral exam: Dental hygiene is fair; lips and gums are healthy appearing.There is no oropharyngeal erythema or exudate noted.  Neck:  No deformities,  masses, or tenderness noted.    Heart:  Normal rate and  regular rhythm. S1 and S2 normal without gallop, murmur, click, rub or other extra sounds.  Lungs:Chest clear to auscultation; no wheezes, rhonchi,rales ,or rubs present.No increased work of breathing.   Extremities:  No cyanosis, edema, or clubbing  noted . 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        Assessment & Plan:  #1 acute bronchitis w/o bronchospasm Plan: See orders and recommendations

## 2012-11-24 ENCOUNTER — Inpatient Hospital Stay (HOSPITAL_COMMUNITY)
Admission: RE | Admit: 2012-11-24 | Payer: BC Managed Care – PPO | Source: Ambulatory Visit | Admitting: Orthopedic Surgery

## 2012-11-24 ENCOUNTER — Encounter (HOSPITAL_COMMUNITY): Admission: RE | Payer: Self-pay | Source: Ambulatory Visit

## 2012-11-24 SURGERY — ARTHROPLASTY, KNEE, TOTAL
Anesthesia: General | Laterality: Left

## 2012-12-03 ENCOUNTER — Other Ambulatory Visit: Payer: Self-pay | Admitting: Family

## 2013-02-16 ENCOUNTER — Other Ambulatory Visit: Payer: Self-pay | Admitting: Family

## 2013-02-16 NOTE — Telephone Encounter (Signed)
Rx request to pharmacy/SLS  

## 2013-03-18 ENCOUNTER — Other Ambulatory Visit: Payer: Self-pay | Admitting: Family

## 2013-04-11 ENCOUNTER — Ambulatory Visit (INDEPENDENT_AMBULATORY_CARE_PROVIDER_SITE_OTHER): Payer: BC Managed Care – PPO | Admitting: Family

## 2013-04-11 ENCOUNTER — Encounter: Payer: BC Managed Care – PPO | Admitting: Family

## 2013-04-11 ENCOUNTER — Encounter: Payer: Self-pay | Admitting: Family

## 2013-04-11 VITALS — BP 140/98 | HR 104 | Temp 98.6°F | Resp 18 | Ht 75.0 in | Wt 254.1 lb

## 2013-04-11 DIAGNOSIS — E119 Type 2 diabetes mellitus without complications: Secondary | ICD-10-CM

## 2013-04-11 DIAGNOSIS — Z0000 Encounter for general adult medical examination without abnormal findings: Secondary | ICD-10-CM

## 2013-04-11 DIAGNOSIS — E785 Hyperlipidemia, unspecified: Secondary | ICD-10-CM

## 2013-04-11 LAB — CBC WITH DIFFERENTIAL/PLATELET
Basophils Absolute: 0 10*3/uL (ref 0.0–0.1)
Basophils Relative: 0 % (ref 0–1)
Lymphocytes Relative: 31 % (ref 12–46)
MCHC: 33.9 g/dL (ref 30.0–36.0)
Monocytes Absolute: 0.4 10*3/uL (ref 0.1–1.0)
Neutro Abs: 3.9 10*3/uL (ref 1.7–7.7)
Neutrophils Relative %: 61 % (ref 43–77)
Platelets: 332 10*3/uL (ref 150–400)
RDW: 14.4 % (ref 11.5–15.5)
WBC: 6.4 10*3/uL (ref 4.0–10.5)

## 2013-04-11 LAB — HEMOGLOBIN A1C: Hgb A1c MFr Bld: 7.8 % — ABNORMAL HIGH (ref <5.7)

## 2013-04-11 MED ORDER — SITAGLIPTIN PHOSPHATE 100 MG PO TABS: 100.0000 mg | ORAL_TABLET | Freq: Every day | ORAL | Status: DC

## 2013-04-11 NOTE — Progress Notes (Signed)
Subjective:    Patient ID: Steve [name], male    DOB: March 15, 1961, 52 y.o.   MRN: [id number]  HPI  Patient presents today for complete physical.  Immunizations: up to date Diet: reports healthy diet Exercise: works in [employer], no cardio Colonoscopy: due  DM2- reports that his sugars are under 200.  Using lantus- 30 units HS and metformin and januvia.    Hyperlipidemia-  On simvastatin- denies myalgia.    Review of Systems  Constitutional: Negative for unexpected weight change.  HENT: Negative for congestion.   Eyes: Negative for visual disturbance.  Respiratory: Negative for cough.   Cardiovascular: Negative for chest pain.  Gastrointestinal: Negative for vomiting, constipation and blood in stool.  Genitourinary: Negative for dysuria and frequency.  Musculoskeletal: Negative for myalgias and arthralgias.  Skin: Negative for rash.  Neurological: Negative for headaches.  Hematological: Negative for adenopathy.  Psychiatric/Behavioral:       Denies depression/anxiety   Past Medical History  Diagnosis Date  . Diabetes mellitus without complication   . Hypertension   . Hyperlipidemia   . Complication of anesthesia     has night terrors after anesthesia but can be violent when waking up  . Headache(784.0)   . 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 Pt stated "I forget alot of things"  . Frequent urination at night     when blood sugar runs high  . Psoriasis of scalp     nose and face; occurs mainly in winter   . Seasonal allergies     History   Social History  . Marital Status: Married    Spouse Name: N/A    Number of Children: 6  . Years of Education: N/A   Occupational History  . Not on file.   Social History Main Topics  . Smoking status: Never Smoker   . Smokeless tobacco: Not on file  . Alcohol Use: No  . Drug Use: No  . Sexually Active: Yes -- Male partner(s)   Other Topics Concern  . Not on file   Social History Narrative    Regular exercise: very little   Caffeine use: sweet tea daily   6 children   Works in [employer] representative, owns concession.   Married   Enjoys televition.      Past Surgical History  Procedure Laterality Date  . Hernia repair  2010    abdominal hernia  . Knee arthroscopy  2012    left knee  . Cardiac catheterization      no PCI approx 10 years ago  . Finger surgery      right ring finger  . Knee surgery      Family History  Problem Relation Age of Onset  . Diabetes Mother   . Cancer Mother     history breast cancer  . Hypertension Mother   . Cancer Father     prostate  . Alzheimer's disease Father   . Diabetes Father   . Hypertension Father   . Cancer Maternal Uncle     colon  . Cancer Maternal Grandmother     breast  . Cancer Maternal Grandfather     prostate  . Cancer Maternal Uncle     prostate  . Cancer Maternal Uncle     prostate  . Cancer Cousin 47    metastatic colon cancer?    Allergies  Allergen Reactions  . Sulfur Itching    Current Outpatient Prescriptions on File Prior to  Visit  Medication Sig Dispense Refill  . fexofenadine-pseudoephedrine (ALLEGRA-D 24) 180-240 MG per 24 hr tablet Take 1 tablet by mouth daily.      . insulin glargine (LANTUS) 100 UNIT/ML injection Inject 35-40 Units into the skin at bedtime. Sliding scale      . lisinopril (PRINIVIL,ZESTRIL) 2.5 MG tablet Take 2.5 mg by mouth daily.      . metFORMIN (GLUCOPHAGE) 1000 MG tablet TAKE 1 TABLET (1,000 MG TOTAL) BY MOUTH 2 (TWO) TIMES DAILY WITH A MEAL.  60 tablet  2  . simvastatin (ZOCOR) 40 MG tablet TAKE 1 TABLET BY MOUTH AT BEDTIME  30 tablet  1   No current facility-administered medications on file prior to visit.    BP 140/98  Pulse 104  Temp(Src) 98.6 F (37 C) (Oral)  Resp 18  Ht 6\' 3"  (1.905 m)  Wt 254 lb 1.9 oz (115.268 kg)  BMI 31.76 kg/m2  SpO2 97%       Objective:   Physical Exam  Physical Exam  Constitutional: He is oriented to person, place, and time.  He appears well-developed and well-nourished. No distress.  HENT:  Head: Normocephalic and atraumatic.  Right Ear: Tympanic membrane and ear canal normal.  Left Ear: Tympanic membrane and ear canal normal.  Mouth/Throat: Oropharynx is clear and moist.  Eyes: Pupils are equal, round, and reactive to light. No scleral icterus.  Neck: Normal range of motion. No thyromegaly present.  Cardiovascular: Normal rate and regular rhythm.   No murmur heard. Pulmonary/Chest: Effort normal and breath sounds normal. No respiratory distress. He has no wheezes. He has no rales. He exhibits no tenderness.  Abdominal: Soft. Bowel sounds are normal. He exhibits no distension and no mass. There is no tenderness. There is no rebound and no guarding.  Musculoskeletal: He exhibits no edema.  Lymphadenopathy:    He has no cervical adenopathy.  Neurological: He is alert and oriented to person, place, and time. He exhibits normal muscle tone. Coordination normal.  Skin: Skin is warm and dry.  Psychiatric: He has a normal mood and affect. His behavior is normal. Judgment and thought content normal.          Assessment & Plan:         Assessment & Plan:

## 2013-04-11 NOTE — Assessment & Plan Note (Signed)
Recommended that he try to add 30 minutes of walking 5 days a week. Continue healthy diet.  Immunizations up to date. Obtain fasting labs, refer for screening colo. Discussed pros/cons of PSA.

## 2013-04-11 NOTE — Patient Instructions (Addendum)
You will be contacted about your referral for colonoscopy.  Please let us know if you have not heard back within 1 week about your referral. Please complete lab work prior to leaving.  Please schedule a follow up appointment in 3 months.

## 2013-04-11 NOTE — Assessment & Plan Note (Signed)
On simvastatin, obtain flp/lft.

## 2013-04-11 NOTE — Assessment & Plan Note (Signed)
Clinically stable on lantus, metformin and januvia, obtain A1C.

## 2013-04-12 ENCOUNTER — Encounter: Payer: Self-pay | Admitting: Family

## 2013-04-12 ENCOUNTER — Telehealth: Payer: Self-pay | Admitting: Family

## 2013-04-12 LAB — URINALYSIS, ROUTINE W REFLEX MICROSCOPIC
Bilirubin Urine: NEGATIVE
Glucose, UA: NEGATIVE mg/dL
Hgb urine dipstick: NEGATIVE
Leukocytes, UA: NEGATIVE
pH: 7.5 (ref 5.0–8.0)

## 2013-04-12 LAB — LIPID PANEL
Cholesterol: 184 mg/dL (ref 0–200)
HDL: 39 mg/dL — ABNORMAL LOW (ref >39)
Triglycerides: 262 mg/dL — ABNORMAL HIGH (ref <150)
VLDL: 52 mg/dL — ABNORMAL HIGH (ref 0–40)

## 2013-04-12 LAB — HEPATIC FUNCTION PANEL
ALT: 27 U/L (ref 0–53)
Bilirubin, Direct: 0.1 mg/dL (ref 0.0–0.3)
Indirect Bilirubin: 0.4 mg/dL (ref 0.0–0.9)
Total Bilirubin: 0.5 mg/dL (ref 0.3–1.2)

## 2013-04-12 LAB — BASIC METABOLIC PANEL WITH GFR
BUN: 7 mg/dL (ref 6–23)
Calcium: 9.4 mg/dL (ref 8.4–10.5)
Chloride: 105 mEq/L (ref 96–112)
Creat: 0.86 mg/dL (ref 0.50–1.35)
GFR, Est African American: >89 mL/min
GFR, Est Non African American: >89 mL/min

## 2013-04-12 NOTE — Telephone Encounter (Signed)
Please let pt know that A1C is above goal.  I would like him to increase lantus by 5 units.  Triglycerides are elevated. Getting sugar down will help this.  Other labs look good.

## 2013-04-13 NOTE — Telephone Encounter (Signed)
LMOM with contact name and number for return call RE: results and further provider instructions/SLS  

## 2013-04-14 NOTE — Telephone Encounter (Signed)
Left detailed message on home# and to call if any questions. 

## 2013-05-02 ENCOUNTER — Encounter: Payer: BC Managed Care – PPO | Admitting: Family

## 2013-05-17 ENCOUNTER — Encounter: Payer: Self-pay | Admitting: Gastroenterology

## 2013-05-24 ENCOUNTER — Encounter: Payer: Self-pay | Admitting: Physician Assistant

## 2013-05-24 ENCOUNTER — Ambulatory Visit (INDEPENDENT_AMBULATORY_CARE_PROVIDER_SITE_OTHER): Payer: BC Managed Care – PPO | Admitting: Physician Assistant

## 2013-05-24 ENCOUNTER — Ambulatory Visit (HOSPITAL_BASED_OUTPATIENT_CLINIC_OR_DEPARTMENT_OTHER)
Admission: RE | Admit: 2013-05-24 | Discharge: 2013-05-24 | Disposition: A | Discharge status: Home / Self Care | Payer: BC Managed Care – PPO | Source: Ambulatory Visit | Attending: Physician Assistant | Admitting: Physician Assistant

## 2013-05-24 VITALS — BP 132/78 | HR 101 | Temp 97.5°F | Resp 16 | Wt 258.1 lb

## 2013-05-24 DIAGNOSIS — M25476 Effusion, unspecified foot: Secondary | ICD-10-CM | POA: Insufficient documentation

## 2013-05-24 DIAGNOSIS — M25572 Pain in left ankle and joints of left foot: Secondary | ICD-10-CM | POA: Insufficient documentation

## 2013-05-24 DIAGNOSIS — M25579 Pain in unspecified ankle and joints of unspecified foot: Secondary | ICD-10-CM

## 2013-05-24 DIAGNOSIS — M25473 Effusion, unspecified ankle: Secondary | ICD-10-CM | POA: Insufficient documentation

## 2013-05-24 DIAGNOSIS — X58XXXA Exposure to other specified factors, initial encounter: Secondary | ICD-10-CM | POA: Insufficient documentation

## 2013-05-24 MED ORDER — INDOMETHACIN 50 MG PO CAPS: 50.0000 mg | ORAL_CAPSULE | Freq: Three times a day (TID) | ORAL | Status: DC

## 2013-05-24 NOTE — Progress Notes (Signed)
Patient ID: Steve [name], male   DOB: 1961/07/28, 52 y.o.   MRN: [id number]   Patient is a 52 year-old caucasian male who presents to clinic today c/o pain and swelling of left lateral ankle first noticed Sunday. Endorses warmth and slight redness over area of swelling.  Patient denies trauma, injury or fall.  Denies decrease in ROM.  Patient has had no difficulty with weightbearing on affected joint.  Denies history of osteoarthritis.   Patient denies numbness or tingling of foot.  Denies change in color to foot or change in temperature.  Endorses the swelling seems worse.  Is unsure of hx of gout/pseudogout.  Has not tried anything for pain or swelling.  Denies fevers, chills, myalgias.   Past Medical History  Diagnosis Date  . Diabetes mellitus without complication   . Hypertension   . Hyperlipidemia   . Complication of anesthesia     has night terrors after anesthesia but can be violent when waking up  . Headache(784.0)   . 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 Pt stated "I forget alot of things"  . Frequent urination at night     when blood sugar runs high  . Psoriasis of scalp     nose and face; occurs mainly in winter   . Seasonal allergies    Current Outpatient Prescriptions on File Prior to Visit  Medication Sig Dispense Refill  . aspirin 81 MG tablet Take 81 mg by mouth daily.      . fexofenadine-pseudoephedrine (ALLEGRA-D 24) 180-240 MG per 24 hr tablet Take 1 tablet by mouth daily.      . insulin glargine (LANTUS) 100 UNIT/ML injection Inject 35-40 Units into the skin at bedtime. Sliding scale      . lisinopril (PRINIVIL,ZESTRIL) 2.5 MG tablet Take 2.5 mg by mouth daily.      . metFORMIN (GLUCOPHAGE) 1000 MG tablet TAKE 1 TABLET (1,000 MG TOTAL) BY MOUTH 2 (TWO) TIMES DAILY WITH A MEAL.  60 tablet  2  . simvastatin (ZOCOR) 40 MG tablet TAKE 1 TABLET BY MOUTH AT BEDTIME  30 tablet  1  . sitaGLIPtin (JANUVIA) 100 MG tablet Take 1 tablet (100 mg total) by mouth  daily.  30 tablet  2   No current facility-administered medications on file prior to visit.   Allergies  Allergen Reactions  . Sulfur Itching   Family History  Problem Relation Age of Onset  . Diabetes Mother   . Cancer Mother     history breast cancer  . Hypertension Mother   . Cancer Father     prostate  . Alzheimer's disease Father   . Diabetes Father   . Hypertension Father   . Cancer Maternal Uncle     colon  . Cancer Maternal Grandmother     breast  . Cancer Maternal Grandfather     prostate  . Cancer Maternal Uncle     prostate  . Cancer Maternal Uncle     prostate  . Cancer Cousin 47    metastatic colon cancer?   History   Social History  . Marital Status: Married    Spouse Name: N/A    Number of Children: 6  . Years of Education: N/A   Social History Main Topics  . Smoking status: Never Smoker   . Smokeless tobacco: None  . Alcohol Use: No  . Drug Use: No  . Sexually Active: Yes -- Male partner(s)   Other Topics  Concern  . None   Social History Narrative   Regular exercise: very little   Caffeine use: sweet tea daily   6 children   Works in [employer], owns concession.   Married   Enjoys televition.     Review of Systems  Constitutional: Negative for fever, chills and malaise/fatigue.  HENT: Negative for neck pain.   Musculoskeletal: Positive for joint pain. Negative for myalgias, back pain and falls.  Neurological: Negative for dizziness, tingling and sensory change.    Filed Vitals:   05/24/13 1316  BP: 132/78  Pulse: 101  Temp: 97.5 F (36.4 C)  Resp: 16   Physical Exam  Constitutional: He is oriented to person, place, and time and well-developed, well-nourished, and in no distress.  HENT:  Head: Normocephalic and atraumatic.  Eyes: Conjunctivae are normal. Pupils are equal, round, and reactive to light.  Cardiovascular: Normal rate, regular rhythm, normal heart sounds and intact distal pulses.   No murmur  heard. Musculoskeletal: Normal range of motion.  + effusion/edema over lateral L ankle, with associated warmth.  No erythema noted.  Neurological: He is alert and oriented to person, place, and time. No cranial nerve deficit. Gait normal.  Skin: Skin is warm and dry. No rash noted.    Assessment/Plan: Pain in left ankle w/ effusion Will obtain Xray to rule out fracture or deformity.  Most likely gout/pseudogout.  Rx for Indomethacin 50mg  TID x 5 days given.  Patient to rest limb.  Ice packs to help reduce swelling.

## 2013-05-24 NOTE — Assessment & Plan Note (Addendum)
Will obtain Xray to rule out fracture or deformity.  Most likely gout/pseudogout.  Rx for Indomethacin 50mg  TID x 5 days given.  Patient to rest limb.  Ice packs to help reduce swelling.

## 2013-05-24 NOTE — Patient Instructions (Signed)
Please take Indomethacin as prescribed (three times daily for 5 days).  Apply ice to swelling.  Please stay off of your feet for the next couple of days until swelling/pain subside.   Ankle Sprain An ankle sprain is an injury to the strong, fibrous tissues (ligaments) that hold the bones of your ankle joint together.  CAUSES An ankle sprain is usually caused by a fall or by twisting your ankle. Ankle sprains most commonly occur when you step on the outer edge of your foot, and your ankle turns inward. People who participate in sports are more prone to these types of injuries.  SYMPTOMS   Pain in your ankle. The pain may be present at rest or only when you are trying to stand or walk.  Swelling.  Bruising. Bruising may develop immediately or within 1 to 2 days after your injury.  Difficulty standing or walking, particularly when turning corners or changing directions. DIAGNOSIS  Your caregiver will ask you details about your injury and perform a physical exam of your ankle to determine if you have an ankle sprain. During the physical exam, your caregiver will press on and apply pressure to specific areas of your foot and ankle. Your caregiver will try to move your ankle in certain ways. An X-ray exam may be done to be sure a bone was not broken or a ligament did not separate from one of the bones in your ankle (avulsion fracture).  TREATMENT  Certain types of braces can help stabilize your ankle. Your caregiver can make a recommendation for this. Your caregiver may recommend the use of medicine for pain. If your sprain is severe, your caregiver may refer you to a surgeon who helps to restore function to parts of your skeletal system (orthopedist) or a physical therapist. HOME CARE INSTRUCTIONS   Apply ice to your injury for 1 2 days or as directed by your caregiver. Applying ice helps to reduce inflammation and pain.  Put ice in a plastic bag.  Place a towel between your skin and the  bag.  Leave the ice on for 15-20 minutes at a time, every 2 hours while you are awake.  Only take over-the-counter or prescription medicines for pain, discomfort, or fever as directed by your caregiver.  Elevate your injured ankle above the level of your heart as much as possible for 2 3 days.  If your caregiver recommends crutches, use them as instructed. Gradually put weight on the affected ankle. Continue to use crutches or a cane until you can walk without feeling pain in your ankle.  If you have a plaster splint, wear the splint as directed by your caregiver. Do not rest it on anything harder than a pillow for the first 24 hours. Do not put weight on it. Do not get it wet. You may take it off to take a shower or bath.  You may have been given an elastic bandage to wear around your ankle to provide support. If the elastic bandage is too tight (you have numbness or tingling in your foot or your foot becomes cold and blue), adjust the bandage to make it comfortable.  If you have an air splint, you may blow more air into it or let air out to make it more comfortable. You may take your splint off at night and before taking a shower or bath. Wiggle your toes in the splint several times per day to decrease swelling. SEEK MEDICAL CARE IF:   You have rapidly increasing  bruising or swelling.  Your toes feel extremely cold or you lose feeling in your foot.  Your pain is not relieved with medicine. SEEK IMMEDIATE MEDICAL CARE IF:  Your toes are numb or blue.  You have severe pain that is increasing. MAKE SURE YOU:   Understand these instructions.  Will watch your condition.  Will get help right away if you are not doing well or get worse. Document Released: 09/29/2005 Document Revised: 06/23/2012 Document Reviewed: 10/11/2011 [facility] Patient Information 2014 [place], [place].  Gout Gout is an inflammatory condition (arthritis) caused by a buildup of uric acid crystals in the joints.  Uric acid is a chemical that is normally present in the blood. Under some circumstances, uric acid can form into crystals in your joints. This causes joint redness, soreness, and swelling (inflammation). Repeat attacks are common. Over time, uric acid crystals can form into masses (tophi) near a joint, causing disfigurement. Gout is treatable and often preventable. CAUSES  The disease begins with elevated levels of uric acid in the blood. Uric acid is produced by your body when it breaks down a naturally found substance called purines. This also happens when you eat certain foods such as meats and fish. Causes of an elevated uric acid level include:  Being passed down from parent to child (heredity).  Diseases that cause increased uric acid production (obesity, psoriasis, some cancers).  Excessive alcohol use.  Diet, especially diets rich in meat and seafood.  Medicines, including certain cancer-fighting drugs (chemotherapy), diuretics, and aspirin.  Chronic kidney disease. The kidneys are no longer able to remove uric acid well.  Problems with metabolism. Conditions strongly associated with gout include:  Obesity.  High blood pressure.  High cholesterol.  Diabetes. Not everyone with elevated uric acid levels gets gout. It is not understood why some people get gout and others do not. Surgery, joint injury, and eating too much of certain foods are some of the factors that can lead to gout. SYMPTOMS   An attack of gout comes on quickly. It causes intense pain with redness, swelling, and warmth in a joint.  Fever can occur.  Often, only one joint is involved. Certain joints are more commonly involved:  Base of the big toe.  Knee.  Ankle.  Wrist.  Finger. Without treatment, an attack usually goes away in a few days to weeks. Between attacks, you usually will not have symptoms, which is different from many other forms of arthritis. DIAGNOSIS  Your caregiver will suspect gout  based on your symptoms and exam. Removal of fluid from the joint (arthrocentesis) is done to check for uric acid crystals. Your caregiver will give you a medicine that numbs the area (local anesthetic) and use a needle to remove joint fluid for exam. Gout is confirmed when uric acid crystals are seen in joint fluid, using a special microscope. Sometimes, blood, urine, and X-ray tests are also used. TREATMENT  There are 2 phases to gout treatment: treating the sudden onset (acute) attack and preventing attacks (prophylaxis). Treatment of an Acute Attack  Medicines are used. These include anti-inflammatory medicines or steroid medicines.  An injection of steroid medicine into the affected joint is sometimes necessary.  The painful joint is rested. Movement can worsen the arthritis.  You may use warm or cold treatments on painful joints, depending which works best for you.  Discuss the use of coffee, vitamin C, or cherries with your caregiver. These may be helpful treatment options. Treatment to Prevent Attacks After the acute  attack subsides, your caregiver may advise prophylactic medicine. These medicines either help your kidneys eliminate uric acid from your body or decrease your uric acid production. You may need to stay on these medicines for a very long time. The early phase of treatment with prophylactic medicine can be associated with an increase in acute gout attacks. For this reason, during the first few months of treatment, your caregiver may also advise you to take medicines usually used for acute gout treatment. Be sure you understand your caregiver's directions. You should also discuss dietary treatment with your caregiver. Certain foods such as meats and fish can increase uric acid levels. Other foods such as dairy can decrease levels. Your caregiver can give you a list of foods to avoid. HOME CARE INSTRUCTIONS   Do not take aspirin to relieve pain. This raises uric acid  levels.  Only take over-the-counter or prescription medicines for pain, discomfort, or fever as directed by your caregiver.  Rest the joint as much as possible. When in bed, keep sheets and blankets off painful areas.  Keep the affected joint raised (elevated).  Use crutches if the painful joint is in your leg.  Drink enough water and fluids to keep your urine clear or pale yellow. This helps your body get rid of uric acid. Do not drink alcoholic beverages. They slow the passage of uric acid.  Follow your caregiver's dietary instructions. Pay careful attention to the amount of protein you eat. Your daily diet should emphasize fruits, vegetables, whole grains, and fat-free or low-fat milk products.  Maintain a healthy body weight. SEEK MEDICAL CARE IF:   You have an oral temperature above 102 F (38.9 C).  You develop diarrhea, vomiting, or any side effects from medicines.  You do not feel better in 24 hours, or you are getting worse. SEEK IMMEDIATE MEDICAL CARE IF:   Your joint becomes suddenly more tender and you have:  Chills.  An oral temperature above 102 F (38.9 C), not controlled by medicine. MAKE SURE YOU:   Understand these instructions.  Will watch your condition.  Will get help right away if you are not doing well or get worse. Document Released: 09/26/2000 Document Revised: 12/22/2011 Document Reviewed: 01/07/2010 [facility] Patient Information 2014 [place], [place].

## 2013-05-31 ENCOUNTER — Telehealth: Payer: Self-pay | Admitting: Family

## 2013-05-31 NOTE — Telephone Encounter (Signed)
Patient states that he is still having foot pain since last visit and would like referral

## 2013-05-31 NOTE — Telephone Encounter (Signed)
Please advise 

## 2013-06-01 ENCOUNTER — Telehealth: Payer: Self-pay | Admitting: *Deleted

## 2013-06-01 DIAGNOSIS — M25572 Pain in left ankle and joints of left foot: Secondary | ICD-10-CM

## 2013-06-01 NOTE — Telephone Encounter (Signed)
Please schedule him for follow up in our office.  We can do some additional testing for gout.

## 2013-06-01 NOTE — Telephone Encounter (Signed)
Per verbal from [facility], ok to proceed with referral and have Dr Pearletha Forge determine if additional labs need to be obtained. Notified pt's wife. She requests first available appt.

## 2013-06-01 NOTE — Telephone Encounter (Signed)
If swelling has persisted, despite therapy, I think that he should be sent to Dr. Pearletha Forge in sports medicine so that he can evaluate patient's ankle an/or aspirate his ankle joint.  Patient can take Ibuprofen if pain occurs with swelling.  I will make a referral to Dr. Pearletha Forge for the patient

## 2013-06-01 NOTE — Telephone Encounter (Signed)
Check that the referral is going to be done because Melissa stated in previous note that pt needs appt first?

## 2013-06-01 NOTE — Telephone Encounter (Signed)
Per verbal from Provider, ok to defer additional lab tests to Dr Pearletha Forge and have pt proceed with referral in 06/01/13 phone note.

## 2013-06-01 NOTE — Telephone Encounter (Signed)
Received message from pt's wife, Steve [name]. She states pt completed medication (iindomethacin) for swelling of his ankle. States that swelling was improving but worsened after he took his last indomethacin and is having difficulty walking again.  She wants to know if pt needs additional medication or appt for re-evaluation?

## 2013-06-01 NOTE — Telephone Encounter (Signed)
Please call and schedule pt an appt.

## 2013-06-14 ENCOUNTER — Ambulatory Visit (INDEPENDENT_AMBULATORY_CARE_PROVIDER_SITE_OTHER): Payer: BC Managed Care – PPO | Admitting: Family Medicine

## 2013-06-14 ENCOUNTER — Encounter: Payer: Self-pay | Admitting: Family Medicine

## 2013-06-14 VITALS — BP 142/84 | HR 82 | Wt 262.0 lb

## 2013-06-14 DIAGNOSIS — M25579 Pain in unspecified ankle and joints of unspecified foot: Secondary | ICD-10-CM

## 2013-06-14 DIAGNOSIS — M25572 Pain in left ankle and joints of left foot: Secondary | ICD-10-CM

## 2013-06-14 DIAGNOSIS — S299XXA Unspecified injury of thorax, initial encounter: Secondary | ICD-10-CM | POA: Insufficient documentation

## 2013-06-14 DIAGNOSIS — S2341XA Sprain of ribs, initial encounter: Secondary | ICD-10-CM

## 2013-06-14 NOTE — Patient Instructions (Signed)
Very nice to meet you Ibuprofen 600mg  3 times a day for the next 3 days Do the exercises on handout and on CD most days of the week Icing 20 minutes 3 times a day can be helpful for ankle and for rib Capsacin cream over the counter can be helpful for the rib Come back in 3-4 weeks if ankle or rib is not all the way better

## 2013-06-14 NOTE — Assessment & Plan Note (Signed)
Likely secondary to sprain. He does have trace effusion. Patient was given an ankle brace today. Home exercise program given Discussed icing Discussed ibuprofen dosing as well as capsaicin Patient will return in 3-4 weeks if not continuing to improve.

## 2013-06-14 NOTE — Assessment & Plan Note (Signed)
Discussed with patient diagnosis as well as prognosis. Will hold on x-rays position not change management. Discussed ibuprofen and icing that could be beneficial Discussed signs and symptoms and went to seek medical attention. Handout given. Followup in 3 weeks if not continuing to improve.

## 2013-06-14 NOTE — Progress Notes (Signed)
  I'm seeing this patient by the request  of:  Piedad Climes PA  CC: Left ankle pain  HPI: Patient is a very pleasant 52 year old male with past medical history significant for diabetes coming in with left ankle pain. Patient states starting on May 22, 2013 he started having left lateral ankle swelling is associated with warmth and redness of the skin. Patient did not remember any true injury or fall at that time. Patient states that he may have twisted it 3 days prior to initial visit. Patient was seen initially on May 24, 2013 it was potentially diagnosed with gout but forgot to Tylenol provider about the potential injury. Since that time he states he has been improving somewhat slowly. Patient is still finding it somewhat difficult to point his toes down. Patient is and doing without much difficulty. Patient denies any nighttime awakening. Patient still has some mild discomfort that he says is a dull aching sensation. Patient states that elevation seems to be helpful. It does seem to be worse when he is going up or down stairs. Patient was the severity of 3/10. Unfortunately yesterday patient did take a tumble and fall onto the side of a grill. Patient's hit the left side of his ribs fairly hard. Patient denies hearing a crack or pop. Past medical, surgical, family and social history reviewed. Medications reviewed all in the electronic medical record.   Review of Systems: No headache, visual changes, nausea, vomiting, diarrhea, constipation, dizziness, abdominal pain, skin rash, fevers, chills, night sweats, weight loss, swollen lymph nodes, body aches, joint swelling, muscle aches, chest pain, shortness of breath, mood changes.   Objective:    Blood pressure 142/84, pulse 82, weight 262 lb (118.842 kg), SpO2 97.00%.   General: No apparent distress alert and oriented x3 mood and affect normal, dressed appropriately.  HEENT: Pupils equal, extraocular movements intact Respiratory:  Patient's speak in full sentences and does not appear short of breath Cardiovascular: No lower extremity edema, non tender, no erythema Skin: Warm dry intact with no signs of infection or rash on extremities or on axial skeleton. Abdomen: Soft nontender Neuro: Cranial nerves II through XII are intact, neurovascularly intact in all extremities with 2+ DTRs and 2+ pulses. Lymph: No lymphadenopathy of posterior or anterior cervical chain or axillae bilaterally.  Gait normal with good balance and coordination.  MSK: Non tender with full range of motion and good stability and symmetric strength and tone of shoulders, elbows, wrist, hip, knees bilaterally.  On the patient's left side over the T8 rib on the lateral aspect there is some bruising that is somewhat tender to palpation. Patient is able to take a deep breath and I do not feel any crepitus in the area. Ankle: left No visible erythema or swelling. Range of motion is full in all directions. Strength is 5/5 in all directions. Patient is tender to palpation over the lateral aspect of the ankle. He does have trace effusion in this area. Talar dome nontender; No pain at base of 5th MT; No tenderness over cuboid; No tenderness over N spot or navicular prominence No tenderness on posterior aspects of lateral and medial malleolus No sign of peroneal tendon subluxations or tenderness to palpation Negative tarsal tunnel tinel's Able to walk 4 steps. Some difficulty stepping on tiptoes  Impression and Recommendations:    No problem-specific assessment & plan notes found for this encounter.   This case required medical decision making of moderate complexity.

## 2013-07-05 ENCOUNTER — Ambulatory Visit: Payer: BC Managed Care – PPO | Admitting: Family Medicine

## 2013-07-05 DIAGNOSIS — Z0289 Encounter for other administrative examinations: Secondary | ICD-10-CM

## 2013-07-13 ENCOUNTER — Telehealth: Payer: Self-pay | Admitting: *Deleted

## 2013-07-13 ENCOUNTER — Ambulatory Visit (AMBULATORY_SURGERY_CENTER): Payer: BC Managed Care – PPO | Admitting: *Deleted

## 2013-07-13 ENCOUNTER — Encounter: Payer: Self-pay | Admitting: Gastroenterology

## 2013-07-13 VITALS — Ht 76.0 in | Wt 257.0 lb

## 2013-07-13 DIAGNOSIS — Z1211 Encounter for screening for malignant neoplasm of colon: Secondary | ICD-10-CM

## 2013-07-13 MED ORDER — MOVIPREP 100 G PO SOLR: 1.0000 | Freq: Once | ORAL | Status: DC

## 2013-07-13 NOTE — Telephone Encounter (Signed)
Please cancel his upcoming colonoscopy, he needs to see primary about his recent chest pains.  He should call her to reschedule screening examination after his heart is evaluated first.

## 2013-07-13 NOTE — Telephone Encounter (Signed)
Called pt and advised that he will need to be seen by primary for recent chest pain, we will cancel colonoscopy on 07/26/13 and he will need to call us back to reschedule this once he has cardiac clearance, pt verbalizes understanding-adm

## 2013-07-13 NOTE — Telephone Encounter (Signed)
Pt has pre-visit today 07/13/13, after reviewing his medical hx the pt stated that he had been having chest pain in the last week (pt did not seek medical treatment), after further questioning, pt states he has had a cardiac cath in the past but nothing was done, he had minimal irregularities. I can not find information on the cardiac cath but found an ED visit in 10/2007 for chest pain, labs were drawn, markers and EKG were negative and ruled out any infarction. Last EKG was done 10/2012,  Normal Sinus. Does pt need to see primary for EKG or have office visit for concern of recent chest pains before colonoscopy on 07/26/13? Did advise pt to seek medical attention if pain occurred again.  Pt has night terrors with sedation and wakes up combative. He is scheduled for moderate sedation. -adm

## 2013-07-13 NOTE — Progress Notes (Signed)
Pt has night terrors when sedated and is combative-adm

## 2013-07-15 ENCOUNTER — Ambulatory Visit (INDEPENDENT_AMBULATORY_CARE_PROVIDER_SITE_OTHER): Payer: BC Managed Care – PPO | Admitting: Family

## 2013-07-15 ENCOUNTER — Encounter: Payer: Self-pay | Admitting: Family

## 2013-07-15 VITALS — BP 120/80 | HR 108 | Temp 98.0°F | Resp 16 | Ht 75.0 in | Wt 255.0 lb

## 2013-07-15 DIAGNOSIS — R079 Chest pain, unspecified: Secondary | ICD-10-CM

## 2013-07-15 DIAGNOSIS — Z23 Encounter for immunization: Secondary | ICD-10-CM

## 2013-07-15 DIAGNOSIS — R0789 Other chest pain: Secondary | ICD-10-CM

## 2013-07-15 NOTE — Progress Notes (Signed)
Subjective:    Patient ID: Steve [name], male    DOB: 03-02-1961, 52 y.o.   MRN: [id number]  HPI  Steve [name] is a 52 yr old male who presents today at the request of Dr. Rob Bunting for evaluation prior to upcoming colonoscopy.  He reports tightness in the chest x 1 month.  He reports that the pain is located on the left lateral chest and is intermittent in nature.  Happens about 1-2 times a week.  Generally occurs at rest.  Pain is non-radiating.  Denies associated nausea. Not worsened by activity. Reports shortness of breath.  SOB has been present for months.  He reports that he cracked some ribs on the left a month ago. This pain is improving.   Reports SOB preceded the rib injury. Denies orthopnea.   Denies lower extremity swelling or calf pain.  Reports SOB is worse with exertion.     Review of Systems See HPI  Past Medical History  Diagnosis Date  . Diabetes mellitus without complication   . Hypertension   . Hyperlipidemia   . Complication of anesthesia     has night terrors after anesthesia but can be violent when waking up  . Headache(784.0)   . 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 Pt stated "I forget alot of things"  . Frequent urination at night     when blood sugar runs high  . Psoriasis of scalp     nose and face; occurs mainly in winter   . Seasonal allergies     History   Social History  . Marital Status: Married    Spouse Name: N/A    Number of Children: 6  . Years of Education: N/A   Occupational History  . Not on file.   Social History Main Topics  . Smoking status: Never Smoker   . Smokeless tobacco: Never Used  . Alcohol Use: No  . Drug Use: No  . Sexual Activity: Yes    Partners: Female   Other Topics Concern  . Not on file   Social History Narrative   Regular exercise: very little   Caffeine use: sweet tea daily   6 children   Works in [employer], owns concession.   Married   Enjoys televition.      Past Surgical  History  Procedure Laterality Date  . Hernia repair  2010    abdominal hernia  . Knee arthroscopy  2012    left knee  . Cardiac catheterization      no PCI approx 10 years ago  . Finger surgery      right ring finger  . Knee surgery      Family History  Problem Relation Age of Onset  . Diabetes Mother   . Cancer Mother     history breast cancer  . Hypertension Mother   . Cancer Father     prostate  . Alzheimer's disease Father   . Diabetes Father   . Hypertension Father   . Cancer Maternal Uncle     colon  . Colon cancer Maternal Uncle 51  . Cancer Maternal Grandmother     breast  . Cancer Maternal Grandfather     prostate  . Cancer Maternal Uncle     prostate  . Colon cancer Maternal Uncle 58  . Cancer Maternal Uncle     prostate  . Cancer Cousin 47    metastatic colon cancer?  . Colon polyps  Paternal Uncle   . Esophageal cancer Neg Hx     Allergies  Allergen Reactions  . Sulfur Itching    Current Outpatient Prescriptions on File Prior to Visit  Medication Sig Dispense Refill  . aspirin 81 MG tablet Take 81 mg by mouth daily.      . fexofenadine-pseudoephedrine (ALLEGRA-D 24) 180-240 MG per 24 hr tablet Take 1 tablet by mouth daily.      . indomethacin (INDOCIN) 50 MG capsule Take 1 capsule (50 mg total) by mouth 3 (three) times daily with meals.  12 capsule  0  . insulin glargine (LANTUS) 100 UNIT/ML injection Inject 35-40 Units into the skin at bedtime. Sliding scale      . lisinopril (PRINIVIL,ZESTRIL) 2.5 MG tablet Take 2.5 mg by mouth daily.      . metFORMIN (GLUCOPHAGE) 1000 MG tablet TAKE 1 TABLET (1,000 MG TOTAL) BY MOUTH 2 (TWO) TIMES DAILY WITH A MEAL.  60 tablet  2  . MOVIPREP 100 G SOLR Take 1 kit (200 g total) by mouth once. Name brand only, movi prep as directed, no substitutions.  1 kit  0  . simvastatin (ZOCOR) 40 MG tablet TAKE 1 TABLET BY MOUTH AT BEDTIME  30 tablet  1  . sitaGLIPtin (JANUVIA) 100 MG tablet Take 1 tablet (100 mg total) by  mouth daily.  30 tablet  2   No current facility-administered medications on file prior to visit.    BP 120/80  Pulse 108  Temp(Src) 98 F (36.7 C) (Oral)  Resp 16  Ht 6\' 3"  (1.905 m)  Wt 255 lb (115.667 kg)  BMI 31.87 kg/m2  SpO2 99%       Objective:   Physical Exam  Constitutional: He is oriented to person, place, and time. He appears well-developed and well-nourished. No distress.  Cardiovascular: Normal rate and regular rhythm.   No murmur heard. Pulmonary/Chest: Effort normal and breath sounds normal. No respiratory distress. He has no wheezes. He has no rales. He exhibits no tenderness.  Musculoskeletal:  Anterior chest wall tenderness to palpation.   Lymphadenopathy:    He has no cervical adenopathy.  Neurological: He is alert and oriented to person, place, and time.  Psychiatric: He has a normal mood and affect. His behavior is normal. Judgment and thought content normal.          Assessment & Plan:

## 2013-07-15 NOTE — Patient Instructions (Addendum)
You will be contacted about your stress test and your echocardiogram. Please go to the ER if you develop recurrent or worsening chest pain.   Follow up with [place] in 1 month.

## 2013-07-15 NOTE — Assessment & Plan Note (Addendum)
EKG is performed today and compared to EKG 10/22/12.  EKG appears unchanged. He is mildly tachycardic, but chart review reveals that this is his baseline. Given multiple risk factors for CAD (DM, age, hyperlipidemia), will obtain stress test. Due to SOB will obtain 2-D echo for further evaluation. He is instructed to go to the ED if recurrent/worsening chest pain occurs and he verbalizes understanding. Hold off on colonoscopy pending above work up.

## 2013-07-25 ENCOUNTER — Other Ambulatory Visit: Payer: Self-pay | Admitting: Family

## 2013-07-25 ENCOUNTER — Telehealth: Payer: Self-pay | Admitting: Family

## 2013-07-25 NOTE — Telephone Encounter (Signed)
Rx request to pharmacy/SLS  

## 2013-07-25 NOTE — Telephone Encounter (Signed)
Patients wife scheduled appointment for 08/03/13

## 2013-07-25 NOTE — Telephone Encounter (Signed)
Please call pt and arrange a preoperative clearance visit for his upcoming knee surgery.

## 2013-07-26 ENCOUNTER — Other Ambulatory Visit: Payer: BC Managed Care – PPO | Admitting: Gastroenterology

## 2013-07-29 ENCOUNTER — Ambulatory Visit (HOSPITAL_COMMUNITY): Payer: BC Managed Care – PPO | Attending: Internal Medicine | Admitting: Cardiology

## 2013-07-29 DIAGNOSIS — R0789 Other chest pain: Secondary | ICD-10-CM

## 2013-07-29 DIAGNOSIS — R0989 Other specified symptoms and signs involving the circulatory and respiratory systems: Secondary | ICD-10-CM | POA: Insufficient documentation

## 2013-07-29 DIAGNOSIS — R0609 Other forms of dyspnea: Secondary | ICD-10-CM | POA: Insufficient documentation

## 2013-07-29 DIAGNOSIS — R072 Precordial pain: Secondary | ICD-10-CM | POA: Insufficient documentation

## 2013-07-29 DIAGNOSIS — E119 Type 2 diabetes mellitus without complications: Secondary | ICD-10-CM | POA: Insufficient documentation

## 2013-07-29 DIAGNOSIS — I10 Essential (primary) hypertension: Secondary | ICD-10-CM | POA: Insufficient documentation

## 2013-07-29 DIAGNOSIS — E785 Hyperlipidemia, unspecified: Secondary | ICD-10-CM | POA: Insufficient documentation

## 2013-07-29 DIAGNOSIS — R0602 Shortness of breath: Secondary | ICD-10-CM | POA: Insufficient documentation

## 2013-07-29 NOTE — Progress Notes (Signed)
Echo performed. 

## 2013-08-01 ENCOUNTER — Telehealth: Payer: Self-pay | Admitting: Family

## 2013-08-01 NOTE — Telephone Encounter (Signed)
Reviewed echo.  Left message on cell requesting that the patient return call.  Reviewed echo. Shows heart is strong.  Mild abnormal relation of the left ventricle and mild thickening of mitral valve leaflets.  These changes should not explain his shortness of breath. I recommend that he follow through with stress test please.

## 2013-08-01 NOTE — Telephone Encounter (Signed)
Notified pt and he voices understanding. 

## 2013-08-03 ENCOUNTER — Ambulatory Visit: Payer: BC Managed Care – PPO | Admitting: Family

## 2013-08-05 ENCOUNTER — Encounter: Payer: Self-pay | Admitting: Family

## 2013-08-05 ENCOUNTER — Emergency Department (HOSPITAL_BASED_OUTPATIENT_CLINIC_OR_DEPARTMENT_OTHER): Payer: BC Managed Care – PPO

## 2013-08-05 ENCOUNTER — Ambulatory Visit (INDEPENDENT_AMBULATORY_CARE_PROVIDER_SITE_OTHER): Payer: BC Managed Care – PPO | Admitting: Family

## 2013-08-05 ENCOUNTER — Encounter (HOSPITAL_BASED_OUTPATIENT_CLINIC_OR_DEPARTMENT_OTHER): Payer: Self-pay | Admitting: Emergency Medicine

## 2013-08-05 ENCOUNTER — Emergency Department (HOSPITAL_BASED_OUTPATIENT_CLINIC_OR_DEPARTMENT_OTHER)
Admission: EM | Admit: 2013-08-05 | Discharge: 2013-08-05 | Disposition: A | Discharge status: Home / Self Care | Payer: BC Managed Care – PPO | Attending: Emergency Medicine | Admitting: Emergency Medicine

## 2013-08-05 VITALS — BP 140/82 | HR 118 | Temp 98.0°F | Resp 16 | Ht 75.0 in | Wt 255.1 lb

## 2013-08-05 DIAGNOSIS — R51 Headache: Secondary | ICD-10-CM | POA: Insufficient documentation

## 2013-08-05 DIAGNOSIS — E119 Type 2 diabetes mellitus without complications: Secondary | ICD-10-CM | POA: Insufficient documentation

## 2013-08-05 DIAGNOSIS — Z872 Personal history of diseases of the skin and subcutaneous tissue: Secondary | ICD-10-CM | POA: Insufficient documentation

## 2013-08-05 DIAGNOSIS — Z7982 Long term (current) use of aspirin: Secondary | ICD-10-CM | POA: Insufficient documentation

## 2013-08-05 DIAGNOSIS — Z79899 Other long term (current) drug therapy: Secondary | ICD-10-CM | POA: Insufficient documentation

## 2013-08-05 DIAGNOSIS — R000 Tachycardia, unspecified: Secondary | ICD-10-CM | POA: Insufficient documentation

## 2013-08-05 DIAGNOSIS — I498 Other specified cardiac arrhythmias: Secondary | ICD-10-CM

## 2013-08-05 DIAGNOSIS — R0789 Other chest pain: Secondary | ICD-10-CM

## 2013-08-05 DIAGNOSIS — I10 Essential (primary) hypertension: Secondary | ICD-10-CM | POA: Insufficient documentation

## 2013-08-05 DIAGNOSIS — E785 Hyperlipidemia, unspecified: Secondary | ICD-10-CM | POA: Insufficient documentation

## 2013-08-05 DIAGNOSIS — R0602 Shortness of breath: Secondary | ICD-10-CM | POA: Insufficient documentation

## 2013-08-05 LAB — CBC WITH DIFFERENTIAL/PLATELET
Eosinophils Absolute: 0.1 10*3/uL (ref 0.0–0.7)
HCT: 44.3 % (ref 39.0–52.0)
Hemoglobin: 15.2 g/dL (ref 13.0–17.0)
Lymphocytes Relative: 28 % (ref 12–46)
Lymphs Abs: 2.7 10*3/uL (ref 0.7–4.0)
Monocytes Absolute: 0.7 10*3/uL (ref 0.1–1.0)
Monocytes Relative: 7 % (ref 3–12)
Neutro Abs: 6.1 10*3/uL (ref 1.7–7.7)
Neutrophils Relative %: 63 % (ref 43–77)
RBC: 5.4 MIL/uL (ref 4.22–5.81)
WBC: 9.6 10*3/uL (ref 4.0–10.5)

## 2013-08-05 LAB — BASIC METABOLIC PANEL
BUN: 9 mg/dL (ref 6–23)
CO2: 29 mEq/L (ref 19–32)
Chloride: 102 mEq/L (ref 96–112)
GFR calc Af Amer: >90 mL/min (ref >90)
GFR calc non Af Amer: >90 mL/min (ref >90)
Glucose, Bld: 105 mg/dL — ABNORMAL HIGH (ref 70–99)
Potassium: 4 mEq/L (ref 3.5–5.1)
Sodium: 142 mEq/L (ref 135–145)

## 2013-08-05 NOTE — Assessment & Plan Note (Addendum)
EKG performed here in the room today notes sinus tachycardia. HR in exam room as high as 125 bpm.  I am concerned re: chest pain, DOE, tachyardia, and diminished oxygen saturation today (oxygen saturation was 99% last visit down to 95% today).  Needs evaluation to rule out PE as well as MI.  I have advised pt to proceed to the ED.  He and his wife are agreeable to do so.  If work up negative for MI and PE, then he can proceed with the scheduled myoview next week.  Report given to Jacki Cones- charge nurse in the ED.

## 2013-08-05 NOTE — Progress Notes (Signed)
Subjective:    Patient ID: Steve [name], male    DOB: [birth year], 52 y.o.   MRN: [id number]  HPI  Mr Mizell is a 52 yr old male who presents today for pre-operative evaluation. He is scheduled for a Left TKA on 12/17 with Dr. Wyline Mood.   Today he is noted to be tachycardic upon arrival.  Upon further questioning he reports several month hx of intermittent chest tightness.Reports a "sharp pain left lateral chest" today which occurred while in exam room.  He reports SOB is worse with exertion. Has been present for a few months. He denies recent long travel or family hx of blood clot.   Review of Systems  HENT: Negative for postnasal drip.   Respiratory: Negative for cough.        Reports 1 week history of shortness of breath.  Denies recent long car rides.    Gastrointestinal: Negative for nausea, vomiting and diarrhea.  Genitourinary: Negative for dysuria and frequency.  Musculoskeletal: Negative for myalgias.  Neurological:       Has had some brief frontal headaches.    Hematological: Negative for adenopathy.   Past Medical History  Diagnosis Date  . Diabetes mellitus without complication   . Hypertension   . Hyperlipidemia   . Complication of anesthesia     has night terrors after anesthesia but can be violent when waking up  . Headache(784.0)   . 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 Pt stated "I forget alot of things"  . Frequent urination at night     when blood sugar runs high  . Psoriasis of scalp     nose and face; occurs mainly in winter   . Seasonal allergies     History   Social History  . Marital Status: Married    Spouse Name: N/A    Number of Children: 6  . Years of Education: N/A   Occupational History  . Not on file.   Social History Main Topics  . Smoking status: Never Smoker   . Smokeless tobacco: Never Used  . Alcohol Use: No  . Drug Use: No  . Sexual Activity: Yes    Partners: Female   Other Topics Concern  . Not on file    Social History Narrative   Regular exercise: very little   Caffeine use: sweet tea daily   6 children   Works in [employer], owns concession.   Married   Enjoys televition.      Past Surgical History  Procedure Laterality Date  . Hernia repair  2010    abdominal hernia  . Knee arthroscopy  2012    left knee  . Cardiac catheterization      no PCI approx 10 years ago  . Finger surgery      right ring finger  . Knee surgery      Family History  Problem Relation Age of Onset  . Diabetes Mother   . Cancer Mother     history breast cancer  . Hypertension Mother   . Cancer Father     prostate  . Alzheimer's disease Father   . Diabetes Father   . Hypertension Father   . Cancer Maternal Uncle     colon  . Colon cancer Maternal Uncle 51  . Cancer Maternal Grandmother     breast  . Cancer Maternal Grandfather     prostate  . Cancer Maternal Uncle     prostate  .  Colon cancer Maternal Uncle 58  . Cancer Maternal Uncle     prostate  . Cancer Cousin 47    metastatic colon cancer?  . Colon polyps Paternal Uncle   . Esophageal cancer Neg Hx     Allergies  Allergen Reactions  . Sulfur Itching    Current Outpatient Prescriptions on File Prior to Visit  Medication Sig Dispense Refill  . aspirin 81 MG tablet Take 81 mg by mouth daily.      . fexofenadine-pseudoephedrine (ALLEGRA-D 24) 180-240 MG per 24 hr tablet Take 1 tablet by mouth daily.      . indomethacin (INDOCIN) 50 MG capsule Take 1 capsule (50 mg total) by mouth 3 (three) times daily with meals.  12 capsule  0  . insulin glargine (LANTUS) 100 UNIT/ML injection Inject 35-40 Units into the skin at bedtime. Sliding scale      . JANUVIA 100 MG tablet TAKE 1 TABLET (100 MG TOTAL) BY MOUTH DAILY.  30 tablet  2  . lisinopril (PRINIVIL,ZESTRIL) 2.5 MG tablet TAKE 1 TABLET (2.5 MG TOTAL) BY MOUTH DAILY.  30 tablet  2  . metFORMIN (GLUCOPHAGE) 1000 MG tablet TAKE 1 TABLET (1,000 MG TOTAL) BY MOUTH 2 (TWO) TIMES DAILY  WITH A MEAL.  60 tablet  2  . MOVIPREP 100 G SOLR Take 1 kit (200 g total) by mouth once. Name brand only, movi prep as directed, no substitutions.  1 kit  0  . simvastatin (ZOCOR) 40 MG tablet TAKE 1 TABLET BY MOUTH AT BEDTIME  30 tablet  1   No current facility-administered medications on file prior to visit.    BP 140/82  Pulse 118  Temp(Src) 98 F (36.7 C) (Oral)  Resp 16  Ht 6\' 3"  (1.905 m)  Wt 255 lb 1.3 oz (115.704 kg)  BMI 31.88 kg/m2  SpO2 95%       Objective:   Physical Exam  Constitutional: He is oriented to person, place, and time. He appears well-developed and well-nourished. No distress.  HENT:  Head: Normocephalic and atraumatic.  Cardiovascular: Regular rhythm, S1 normal and S2 normal.  Tachycardia present.   Pulmonary/Chest: Effort normal and breath sounds normal. No respiratory distress. He has no wheezes. He has no rales. He exhibits no tenderness.  Musculoskeletal: He exhibits no edema.  No anterior chest wall tenderness to palpation.  Lymphadenopathy:    He has no cervical adenopathy.  Neurological: He is alert and oriented to person, place, and time.  Psychiatric: He has a normal mood and affect. His behavior is normal. Judgment and thought content normal.          Assessment & Plan:

## 2013-08-05 NOTE — ED Notes (Signed)
Critical K+ 2.5 called by Marcello Moores, Lab Tech.

## 2013-08-05 NOTE — Patient Instructions (Signed)
Please proceed to the ED for further evaluation.  Follow up in 1-2 weeks.

## 2013-08-05 NOTE — ED Provider Notes (Signed)
CSN: [id number]     Arrival date & time 08/05/13  1443 History   First MD Initiated Contact with Patient 08/05/13 1454     Chief Complaint  Patient presents with  . Chest Pain   (Consider location/radiation/quality/duration/timing/severity/associated sxs/prior Treatment) HPI Comments: Patient here from PCP's office for evaluation of several week history of left anterior chest pain.  He reports that the pain is episodic, sharp and stabbing and usually lasts for a few seconds and is relieved spontaneously.  He currently denies any pain.  He states that it is associated with shortness of breath but denies nausea, diaphoresis or palpitations.  He states that for the past several days he has also had sharp and stabbing headaches that come and go and are not associated with the chest pain.  Patient is a 52 y.o. male presenting with chest pain. The history is provided by the patient and the spouse. No language interpreter was used.  Chest Pain Pain location:  L chest Pain quality: sharp and stabbing   Pain radiates to:  Does not radiate Pain radiates to the back: no   Pain severity:  Moderate Onset quality:  Sudden Duration:  2 weeks Timing:  Intermittent Progression:  Resolved Chronicity:  New Relieved by:  Nothing Worsened by:  Nothing tried Associated symptoms: headache and shortness of breath   Associated symptoms: no abdominal pain, no anxiety, no back pain, no claudication, no cough, no diaphoresis, no dizziness, no dysphagia, no lower extremity edema, no nausea, no near-syncope, no orthopnea, no palpitations, not vomiting and no weakness   Risk factors: diabetes mellitus, hypertension and male sex   Risk factors: no coronary artery disease     Past Medical History  Diagnosis Date  . Diabetes mellitus without complication   . Hypertension   . Hyperlipidemia   . Complication of anesthesia     has night terrors after anesthesia but can be violent when waking up  . Headache(784.0)    . 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 Pt stated "I forget alot of things"  . Frequent urination at night     when blood sugar runs high  . Psoriasis of scalp     nose and face; occurs mainly in winter   . Seasonal allergies    Past Surgical History  Procedure Laterality Date  . Hernia repair  2010    abdominal hernia  . Knee arthroscopy  2012    left knee  . Cardiac catheterization      no PCI approx 10 years ago  . Finger surgery      right ring finger  . Knee surgery     Family History  Problem Relation Age of Onset  . Diabetes Mother   . Cancer Mother     history breast cancer  . Hypertension Mother   . Cancer Father     prostate  . Alzheimer's disease Father   . Diabetes Father   . Hypertension Father   . Cancer Maternal Uncle     colon  . Colon cancer Maternal Uncle 51  . Cancer Maternal Grandmother     breast  . Cancer Maternal Grandfather     prostate  . Cancer Maternal Uncle     prostate  . Colon cancer Maternal Uncle 58  . Cancer Maternal Uncle     prostate  . Cancer Cousin 47    metastatic colon cancer?  . Colon polyps Paternal Uncle   .  Esophageal cancer Neg Hx    History  Substance Use Topics  . Smoking status: Never Smoker   . Smokeless tobacco: Never Used  . Alcohol Use: No    Review of Systems  Constitutional: Negative for diaphoresis.  HENT: Negative for trouble swallowing.   Respiratory: Positive for shortness of breath. Negative for cough.   Cardiovascular: Positive for chest pain. Negative for palpitations, orthopnea, claudication and near-syncope.  Gastrointestinal: Negative for nausea, vomiting and abdominal pain.  Musculoskeletal: Negative for back pain.  Neurological: Positive for headaches. Negative for dizziness and weakness.  All other systems reviewed and are negative.    Allergies  Sulfur  Home Medications   Current Outpatient Rx  Name  Route  Sig  Dispense  Refill  . aspirin 81 MG tablet    Oral   Take 81 mg by mouth daily.         . fexofenadine-pseudoephedrine (ALLEGRA-D 24) 180-240 MG per 24 hr tablet   Oral   Take 1 tablet by mouth daily.         . indomethacin (INDOCIN) 50 MG capsule   Oral   Take 1 capsule (50 mg total) by mouth 3 (three) times daily with meals.   12 capsule   0   . insulin glargine (LANTUS) 100 UNIT/ML injection   Subcutaneous   Inject 35-40 Units into the skin at bedtime. Sliding scale         . JANUVIA 100 MG tablet      TAKE 1 TABLET (100 MG TOTAL) BY MOUTH DAILY.   30 tablet   2   . lisinopril (PRINIVIL,ZESTRIL) 2.5 MG tablet      TAKE 1 TABLET (2.5 MG TOTAL) BY MOUTH DAILY.   30 tablet   2   . metFORMIN (GLUCOPHAGE) 1000 MG tablet      TAKE 1 TABLET (1,000 MG TOTAL) BY MOUTH 2 (TWO) TIMES DAILY WITH A MEAL.   60 tablet   2   . MOVIPREP 100 G SOLR   Oral   Take 1 kit (200 g total) by mouth once. Name brand only, movi prep as directed, no substitutions.   1 kit   0     Dispense as written.   . simvastatin (ZOCOR) 40 MG tablet      TAKE 1 TABLET BY MOUTH AT BEDTIME   30 tablet   1    BP 139/96  Temp(Src) 97.8 F (36.6 C) (Oral)  Resp 20  Ht 6\' 4"  (1.93 m)  Wt 255 lb (115.667 kg)  BMI 31.05 kg/m2  SpO2 97% Physical Exam  Nursing note and vitals reviewed. Constitutional: He is oriented to person, place, and time. He appears well-developed and well-nourished. No distress.  HENT:  Head: Normocephalic and atraumatic.  Right Ear: External ear normal.  Left Ear: External ear normal.  Nose: Nose normal.  Mouth/Throat: Oropharynx is clear and moist. No oropharyngeal exudate.  Eyes: Conjunctivae are normal. Pupils are equal, round, and reactive to light. No scleral icterus.  Neck: Normal range of motion. Neck supple.  Cardiovascular: Normal rate, regular rhythm and normal heart sounds.  Exam reveals no gallop and no friction rub.   No murmur heard. Pulmonary/Chest: Effort normal and breath sounds normal. No  respiratory distress. He has no wheezes. He has no rales. He exhibits no tenderness.  Abdominal: Soft. Bowel sounds are normal. He exhibits no distension. There is no tenderness. There is no rebound and no guarding.  Musculoskeletal: Normal range of motion. He  exhibits no edema and no tenderness.  Lymphadenopathy:    He has no cervical adenopathy.  Neurological: He is alert and oriented to person, place, and time. He exhibits normal muscle tone. Coordination normal.  Skin: Skin is warm and dry. No rash noted. No erythema. No pallor.  Psychiatric: He has a normal mood and affect. His behavior is normal. Judgment and thought content normal.    ED Course  Procedures (including critical care time) Labs Review Labs Reviewed  CBC WITH DIFFERENTIAL  D-DIMER, QUANTITATIVE   Imaging Review No results found.  EKG Interpretation     Ventricular Rate:  104 PR Interval:  174 QRS Duration: 92 QT Interval:  336 QTC Calculation: 441 R Axis:   -24 Text Interpretation:  Sinus tachycardia Otherwise normal ECG No significant change since last tracing           Results for orders placed during the hospital encounter of 08/05/13  CBC WITH DIFFERENTIAL      Result Value Range   WBC 9.6  4.0 - 10.5 K/uL   RBC 5.40  4.22 - 5.81 MIL/uL   Hemoglobin 15.2  13.0 - 17.0 g/dL   HCT 16.1  09.6 - 04.5 %   MCV 82.0  78.0 - 100.0 fL   MCH 28.1  26.0 - 34.0 pg   MCHC 34.3  30.0 - 36.0 g/dL   RDW 40.9  81.1 - 91.4 %   Platelets 326  150 - 400 K/uL   Neutrophils Relative % 63  43 - 77 %   Neutro Abs 6.1  1.7 - 7.7 K/uL   Lymphocytes Relative 28  12 - 46 %   Lymphs Abs 2.7  0.7 - 4.0 K/uL   Monocytes Relative 7  3 - 12 %   Monocytes Absolute 0.7  0.1 - 1.0 K/uL   Eosinophils Relative 1  0 - 5 %   Eosinophils Absolute 0.1  0.0 - 0.7 K/uL   Basophils Relative 0  0 - 1 %   Basophils Absolute 0.0  0.0 - 0.1 K/uL  D-DIMER, QUANTITATIVE      Result Value Range   D-Dimer, Quant <0.27  0.00 - 0.48  ug/mL-FEU  BASIC METABOLIC PANEL      Result Value Range   Sodium 142  135 - 145 mEq/L   Potassium 4.0  3.5 - 5.1 mEq/L   Chloride 102  96 - 112 mEq/L   CO2 29  19 - 32 mEq/L   Glucose, Bld 105 (*) 70 - 99 mg/dL   BUN 9  6 - 23 mg/dL   Creatinine, Ser 7.82  0.50 - 1.35 mg/dL   Calcium 95.6  8.4 - 21.3 mg/dL   GFR calc non Af Amer >90  >90 mL/min   GFR calc Af Amer >90  >90 mL/min  TROPONIN I      Result Value Range   Troponin I <0.30  <0.30 ng/mL   Dg Chest 2 View  08/05/2013   CLINICAL DATA:  Shortness of breath. Chest pain.  EXAM: CHEST  2 VIEW  COMPARISON:  10/18/2012  FINDINGS: Low lung volumes are present, causing crowding of the pulmonary vasculature. Cardiac and mediastinal margins appear normal. No pleural effusion identified. No discrete airspace opacity.  Mild thoracic spondylosis.  IMPRESSION: No active cardiopulmonary disease.   Electronically Signed   By: Herbie Baltimore M.D.   On: 08/05/2013 16:57     MDM  Atypical chest pain  Patient here with atypical chest pain, not  reproducible, doubt PE or CAD as cause, patient already has Cards follow up with stress test scheduled for next week.      Izola Price Marisue Humble, PA-C 08/05/13 1722

## 2013-08-05 NOTE — ED Notes (Signed)
Patient states he has a two month history of left chest pain.  Describes chest pain as intermittent and sharp.  States he has also had sob which has been constant.  States yesterday he had sharp pain running thru his head.

## 2013-08-06 NOTE — ED Provider Notes (Signed)
Medical screening examination/treatment/procedure(s) were performed by non-physician practitioner and as supervising physician I was immediately available for consultation/collaboration.  EKG Interpretation     Ventricular Rate:  104 PR Interval:  174 QRS Duration: 92 QT Interval:  336 QTC Calculation: 441 R Axis:   -24 Text Interpretation:  Sinus tachycardia Otherwise normal ECG No significant change since last tracing              Gwyneth Sprout, MD 08/06/13 [phone]

## 2013-08-10 ENCOUNTER — Ambulatory Visit (HOSPITAL_COMMUNITY): Payer: BC Managed Care – PPO | Attending: Family Medicine | Admitting: Radiology

## 2013-08-10 VITALS — BP 132/90 | HR 81 | Ht 75.0 in | Wt 257.0 lb

## 2013-08-10 DIAGNOSIS — Z794 Long term (current) use of insulin: Secondary | ICD-10-CM | POA: Insufficient documentation

## 2013-08-10 DIAGNOSIS — R079 Chest pain, unspecified: Secondary | ICD-10-CM | POA: Insufficient documentation

## 2013-08-10 DIAGNOSIS — E119 Type 2 diabetes mellitus without complications: Secondary | ICD-10-CM | POA: Insufficient documentation

## 2013-08-10 DIAGNOSIS — R0609 Other forms of dyspnea: Secondary | ICD-10-CM | POA: Insufficient documentation

## 2013-08-10 DIAGNOSIS — I10 Essential (primary) hypertension: Secondary | ICD-10-CM | POA: Insufficient documentation

## 2013-08-10 DIAGNOSIS — R0789 Other chest pain: Secondary | ICD-10-CM

## 2013-08-10 DIAGNOSIS — R0989 Other specified symptoms and signs involving the circulatory and respiratory systems: Secondary | ICD-10-CM | POA: Insufficient documentation

## 2013-08-10 DIAGNOSIS — R9439 Abnormal result of other cardiovascular function study: Secondary | ICD-10-CM | POA: Insufficient documentation

## 2013-08-10 DIAGNOSIS — R0602 Shortness of breath: Secondary | ICD-10-CM | POA: Insufficient documentation

## 2013-08-10 DIAGNOSIS — Z8249 Family history of ischemic heart disease and other diseases of the circulatory system: Secondary | ICD-10-CM | POA: Insufficient documentation

## 2013-08-10 MED ORDER — TECHNETIUM TC 99M SESTAMIBI GENERIC - CARDIOLITE
33.0000 | Freq: Once | INTRAVENOUS | Status: AC | PRN
  Administered 2013-08-10: 33 via INTRAVENOUS

## 2013-08-10 MED ORDER — TECHNETIUM TC 99M SESTAMIBI GENERIC - CARDIOLITE
11.0000 | Freq: Once | INTRAVENOUS | Status: AC | PRN
  Administered 2013-08-10: 11 via INTRAVENOUS

## 2013-08-10 MED ORDER — REGADENOSON 0.4 MG/5ML IV SOLN
0.4000 mg | Freq: Once | INTRAVENOUS | Status: AC
  Administered 2013-08-10: 0.4 mg via INTRAVENOUS

## 2013-08-10 NOTE — Progress Notes (Signed)
[facility] SITE 3 NUCLEAR MED [address] [place], [place] [address] [phone]    Cardiology Nuclear Med Study  Steve [name] is a 52 y.o. male     MRN : [id number]     DOB: Mar 12, 1961  Procedure Date: 08/10/2013  Nuclear Med Background Indication for Stress Test:  Evaluation for Ischemia and Pending Surgical Clearance for  (L) TKR on 09/28/13 by Dr. Mckinley Jewel History:  CAD; '02 OZH:[id number] per pt; 07/29/13 Echo:EF=60% Cardiac Risk Factors: Family History - CAD, Hypertension, IDDM, and Lipids  Symptoms:  Chest Pain (last date of chest discomfort:none since discharge), DOE/SOB and Rapid HR    Nuclear Pre-Procedure Caffeine/Decaff Intake:  None > 12 hrs NPO After: 9:00pm   Lungs:  Clear. O2 Sat: 95% on room air. IV 0.9% NS with Angio Cath:  20g  IV Site: R Antecubital x 1, tolerated well IV Started by:  Irean Hong, RN  Chest Size (in):  52 Cup Size: n/a  Height: 6\' 3"  (1.905 m)  Weight:  257 lb (116.574 kg)  BMI:  Body mass index is 32.12 kg/(m^2). Tech Comments:  No insulin or diabetic po medications today. Fasting CBG was 146 at 0800 today. Irean Hong, RN.    Nuclear Med Study 1 or 2 day study: 1 day  Stress Test Type:  Treadmill/Lexiscan  Reading MD: Cassell Clement, MD  Order Authorizing Provider:  Danise Edge, MD, and Sandford Craze, NP  Resting Radionuclide: Technetium 67m Sestamibi  Resting Radionuclide Dose: 11.0 mCi   Stress Radionuclide:  Technetium 44m Sestamibi  Stress Radionuclide Dose: 33.0 mCi           Stress Protocol Rest HR: 81 Stress HR: 115  Rest BP: 132/90 Stress BP: 168/78  Exercise Time (min): 2:00 METS: n/a   Predicted Max HR: 170 bpm % Max HR: 67.65 bpm Rate Pressure Product: 46962   Dose of Adenosine (mg):  n/a Dose of Lexiscan: 0.4 mg  Dose of Atropine (mg): n/a Dose of Dobutamine: n/a mcg/kg/min (at max HR)  Stress Test Technologist: Smiley Houseman, CMA-N  Nuclear Technologist:  Dario Guardian, CNMT     Rest  Procedure:  Myocardial perfusion imaging was performed at rest 45 minutes following the intravenous administration of Technetium 86m Sestamibi.  Rest ECG: NSR - Normal EKG  Stress Procedure:  The patient received IV Lexiscan 0.4 mg over 15-seconds with concurrent low level exercise and then Technetium 29m Sestamibi was injected at 30-seconds while the patient continued walking one more minute.  He c/o dyspnea with Lexiscan.  Quantitative spect images were obtained after a 45-minute delay.  Stress ECG: No significant change from baseline ECG  QPS Raw Data Images:  Normal; no motion artifact; normal heart/lung ratio. Stress Images:  Normal homogeneous uptake in all areas of the myocardium. Rest Images:  Normal homogeneous uptake in all areas of the myocardium. Subtraction (SDS):  No evidence of ischemia. Transient Ischemic Dilatation (Normal <1.22):  1.09 Lung/Heart Ratio (Normal <0.45):  0.22  Quantitative Gated Spect Images QGS EDV:  149 ml QGS ESV:  91 ml  Impression Exercise Capacity:  Lexiscan with low level exercise. BP Response:  Normal blood pressure response. Clinical Symptoms:  No chest pain. ECG Impression:  No significant ST segment change suggestive of ischemia. Comparison with Prior Nuclear Study: No images to compare  Overall Impression:  Low risk stress nuclear study.  No reversible ischemia. There is mild apical attenuation on both rest and stress images.  Gated imaging shows mild  global hypokinesis.  LV Ejection Fraction: 39%.  LV Wall Motion:  Mild global hypokinesis. Visually the LV systolic function appears better than 39%.  No segmental wall motion abnormalities.   Cassell Clement

## 2013-08-15 ENCOUNTER — Telehealth: Payer: Self-pay | Admitting: Family

## 2013-08-15 DIAGNOSIS — R9439 Abnormal result of other cardiovascular function study: Secondary | ICD-10-CM

## 2013-08-15 NOTE — Telephone Encounter (Signed)
Please call pt and let him know that his stress test shows that his heart is not pumping as efficiently as it should be.  It did not show any sign of blockage.   I would like for him to meet with cardiology for clearance prior to his upcoming surgery and then back with me to complete lab testing.  Surgery is scheduled for 12/17.

## 2013-08-15 NOTE — Telephone Encounter (Signed)
Notified pt and scheduled follow up for 09/16/13 at 1:15pm.

## 2013-08-15 NOTE — Telephone Encounter (Signed)
Notified pt and he voices understanding. When do you want to see pt for follow up and what lab tests will he need to complete? Will they need to be completed before appt with [place]?

## 2013-08-15 NOTE — Telephone Encounter (Signed)
Lets see him the first week of December.  Hopefully by then he will have seen cardiology.Will do labs at his visit.

## 2013-08-24 ENCOUNTER — Ambulatory Visit (INDEPENDENT_AMBULATORY_CARE_PROVIDER_SITE_OTHER): Payer: BC Managed Care – PPO | Admitting: Internal Medicine

## 2013-08-24 ENCOUNTER — Ambulatory Visit: Payer: BC Managed Care – PPO | Admitting: Internal Medicine

## 2013-08-24 ENCOUNTER — Encounter: Payer: Self-pay | Admitting: Internal Medicine

## 2013-08-24 VITALS — BP 133/84 | HR 104 | Ht 76.0 in | Wt 256.8 lb

## 2013-08-24 DIAGNOSIS — I498 Other specified cardiac arrhythmias: Secondary | ICD-10-CM

## 2013-08-24 DIAGNOSIS — R000 Tachycardia, unspecified: Secondary | ICD-10-CM

## 2013-08-24 DIAGNOSIS — E785 Hyperlipidemia, unspecified: Secondary | ICD-10-CM

## 2013-08-24 DIAGNOSIS — Z01810 Encounter for preprocedural cardiovascular examination: Secondary | ICD-10-CM

## 2013-08-24 DIAGNOSIS — E119 Type 2 diabetes mellitus without complications: Secondary | ICD-10-CM

## 2013-08-24 MED ORDER — METOPROLOL SUCCINATE ER 25 MG PO TB24: 12.5000 mg | ORAL_TABLET | Freq: Every day | ORAL | Status: DC

## 2013-08-24 NOTE — Progress Notes (Signed)
OFFICE NOTE  Chief Complaint:  Pre-operative clearance, tachycardia  Primary Care Physician: Lemont Fillers., NP  HPI:  Steve [name] is a pleasant 52 year old male who was referred for preoperative evaluation prior to knee surgery. He's been having problems with left knee pain has been evaluated by Dr. Eulah Pont for total knee replacement. He recently has been having shortness of breath for the past couple months and was noted to be tachycardic at rest. His past medical history significant for insulin dependent diabetes dyslipidemia and hypertension. He probably had a cardiac catheterization about 10 years ago which did not show any significant coronary disease. Recently he underwent an echocardiogram which showed an EF of 55-60%, stage I diastolic dysfunction mild LVH. No regional wall motion abnormalities were noted. He also underwent a stress test on 08/10/2013, which is interpreted as low risk with no reversible ischemia. EF on this study was 39%, however there was a gating abnormality likely to explain the decreased EF.  He does have a sinus tachycardia by EKG today with a heart rate of 104. He reports his heart rate is been fast for most of his life. He denies any chest pain or significant shortness of breath except with increased exertion.  PMHx:  Past Medical History  Diagnosis Date  . Diabetes mellitus without complication   . Hypertension   . Hyperlipidemia   . Complication of anesthesia     has night terrors after anesthesia but can be violent when waking up  . Headache(784.0)   . 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 Pt stated "I forget alot of things"  . Frequent urination at night     when blood sugar runs high  . Psoriasis of scalp     nose and face; occurs mainly in winter   . Seasonal allergies   . CAD (coronary artery disease)     Past Surgical History  Procedure Laterality Date  . Hernia repair  2010    abdominal hernia  . Knee  arthroscopy  2012    left knee  . Cardiac catheterization      no PCI approx 10 years ago [facility]  . Finger surgery      right ring finger  . Knee surgery      FAMHx:  Family History  Problem Relation Age of Onset  . Diabetes Mother   . Cancer Mother     history breast cancer  . Hypertension Mother   . Cancer Father     prostate  . Alzheimer's disease Father   . Diabetes Father   . Hypertension Father   . Cancer Maternal Uncle     colon  . Colon cancer Maternal Uncle 51  . Cancer Maternal Grandmother     breast  . Cancer Maternal Grandfather     prostate  . Cancer Maternal Uncle     prostate  . Colon cancer Maternal Uncle 58  . Cancer Maternal Uncle     prostate  . Cancer Cousin 47    metastatic colon cancer?  . Colon polyps Paternal Uncle   . Esophageal cancer Neg Hx     SOCHx:   reports that he has never smoked. He has never used smokeless tobacco. He reports that he does not drink alcohol or use illicit drugs.  ALLERGIES:  Allergies  Allergen Reactions  . Sulfur Itching    ROS: A comprehensive review of systems was negative except for: Respiratory: positive for  dyspnea on exertion Cardiovascular: positive for tachycardia  HOME MEDS: Current Outpatient Prescriptions  Medication Sig Dispense Refill  . aspirin 81 MG tablet Take 81 mg by mouth daily.      . fexofenadine-pseudoephedrine (ALLEGRA-D 24) 180-240 MG per 24 hr tablet Take 1 tablet by mouth daily.      . insulin glargine (LANTUS) 100 UNIT/ML injection Inject 25-30 Units into the skin at bedtime. Sliding scale      . JANUVIA 100 MG tablet TAKE 1 TABLET (100 MG TOTAL) BY MOUTH DAILY.  30 tablet  2  . lisinopril (PRINIVIL,ZESTRIL) 2.5 MG tablet TAKE 1 TABLET (2.5 MG TOTAL) BY MOUTH DAILY.  30 tablet  2  . metFORMIN (GLUCOPHAGE) 1000 MG tablet TAKE 1 TABLET (1,000 MG TOTAL) BY MOUTH 2 (TWO) TIMES DAILY WITH A MEAL.  60 tablet  2  . simvastatin (ZOCOR) 40 MG tablet TAKE 1 TABLET BY MOUTH AT  BEDTIME  30 tablet  1  . metoprolol succinate (TOPROL XL) 25 MG 24 hr tablet Take 0.5 tablets (12.5 mg total) by mouth daily.  45 tablet  3   No current facility-administered medications for this visit.    LABS/IMAGING: No results found for this or any previous visit (from the past 48 hour(s)). No results found.  VITALS: BP 133/84  Pulse 104  Ht 6\' 4"  (1.93 m)  Wt 256 lb 12.8 oz (116.484 kg)  BMI 31.27 kg/m2  EXAM: General appearance: alert and no distress Neck: no carotid bruit and no JVD Lungs: clear to auscultation bilaterally Heart: regular tachycarida Abdomen: soft, non-tender; bowel sounds normal; no masses,  no organomegaly Extremities: extremities normal, atraumatic, no cyanosis or edema Pulses: 2+ and symmetric Skin: Skin color, texture, turgor normal. No rashes or lesions Neurologic: Grossly normal Psych: Mood, affect normal  EKG: Sinus tachycardia at 104  ASSESSMENT: 1. Low risk for loading intermediate risk surgery 2. Tachycardia with associated dyspnea 3. Obesity 4. Insulin-dependent diabetes 5. Dyslipidemia 6. Hypertension-controlled  PLAN: 1.   Mr. Sippel has fortunately had recent echocardiography and a stress test both of which to not suggest any significant cardiology issues. Despite his diabetes and dyslipidemia as well as hypertension and obesity, his risk factors are being well controlled I do feel that he is low risk for surgery. Studies continue to show that perioperative beta blockade is the best way to reduce perioperative risk. This may help also with his shortness of breath and tachycardia. I recommended starting low-dose Toprol-XL 12.5 mg daily and continuing that throughout the perioperative period and beyond.  I would be happy to be of assistance if any issues arise during surgery.  Thanks again for the kind consult.  Chrystie Nose, MD, [facility] Attending Cardiologist [facility]  Angeleena Dueitt C 08/24/2013, 2:16 PM

## 2013-08-24 NOTE — Patient Instructions (Signed)
Your physician wants you to follow-up in: 1 year with Dr. Rennis Golden. You will receive a reminder letter in the mail two months in advance. If you don't receive a letter, please call our office to schedule the follow-up appointment.  Start taking Toprol XL 12.5mg  daily (1/2 of 25mg  tablet)

## 2013-08-26 ENCOUNTER — Encounter: Payer: Self-pay | Admitting: Internal Medicine

## 2013-08-29 ENCOUNTER — Encounter: Payer: Self-pay | Admitting: Internal Medicine

## 2013-09-07 ENCOUNTER — Ambulatory Visit (INDEPENDENT_AMBULATORY_CARE_PROVIDER_SITE_OTHER): Payer: BC Managed Care – PPO | Admitting: Physician Assistant

## 2013-09-07 ENCOUNTER — Encounter: Payer: Self-pay | Admitting: Physician Assistant

## 2013-09-07 VITALS — BP 128/78 | HR 88 | Temp 97.6°F | Resp 16 | Ht 74.0 in | Wt 260.5 lb

## 2013-09-07 DIAGNOSIS — J309 Allergic rhinitis, unspecified: Secondary | ICD-10-CM | POA: Insufficient documentation

## 2013-09-07 DIAGNOSIS — J019 Acute sinusitis, unspecified: Secondary | ICD-10-CM | POA: Insufficient documentation

## 2013-09-07 MED ORDER — AMOXICILLIN-POT CLAVULANATE 875-125 MG PO TABS: 1.0000 | ORAL_TABLET | Freq: Two times a day (BID) | ORAL | Status: DC

## 2013-09-07 MED ORDER — FLUTICASONE PROPIONATE 50 MCG/ACT NA SUSP: 2.0000 | Freq: Every day | NASAL | Status: DC

## 2013-09-07 MED ORDER — LISINOPRIL 2.5 MG PO TABS: ORAL_TABLET | ORAL | Status: DC

## 2013-09-07 NOTE — Progress Notes (Signed)
Patient ID: Steve [name], male   DOB: 04/23/1961, 52 y.o.   MRN: [id number]  Patient presents to clinic today c/o nasal congestion, head congestion, sinus pressure, sinus pain and tooth pain x 3 weeks.  Denies fever, chills, myalgias or fatigue.  Denies sick contact.  Does endorse seasonal allergies.    Past Medical History  Diagnosis Date  . Diabetes mellitus without complication   . Hypertension   . Hyperlipidemia   . Complication of anesthesia     has night terrors after anesthesia but can be violent when waking up  . Headache(784.0)   . 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 Pt stated "I forget alot of things"  . Frequent urination at night     when blood sugar runs high  . Psoriasis of scalp     nose and face; occurs mainly in winter   . Seasonal allergies   . CAD (coronary artery disease)     Current Outpatient Prescriptions on File Prior to Visit  Medication Sig Dispense Refill  . aspirin 81 MG tablet Take 81 mg by mouth daily.      . fexofenadine-pseudoephedrine (ALLEGRA-D 24) 180-240 MG per 24 hr tablet Take 1 tablet by mouth daily.      . insulin glargine (LANTUS) 100 UNIT/ML injection Inject 25-30 Units into the skin at bedtime. Sliding scale      . JANUVIA 100 MG tablet TAKE 1 TABLET (100 MG TOTAL) BY MOUTH DAILY.  30 tablet  2  . metFORMIN (GLUCOPHAGE) 1000 MG tablet TAKE 1 TABLET (1,000 MG TOTAL) BY MOUTH 2 (TWO) TIMES DAILY WITH A MEAL.  60 tablet  2  . metoprolol succinate (TOPROL XL) 25 MG 24 hr tablet Take 0.5 tablets (12.5 mg total) by mouth daily.  45 tablet  3  . simvastatin (ZOCOR) 40 MG tablet TAKE 1 TABLET BY MOUTH AT BEDTIME  30 tablet  1   No current facility-administered medications on file prior to visit.    Allergies  Allergen Reactions  . Sulfur Itching    Family History  Problem Relation Age of Onset  . Diabetes Mother   . Cancer Mother     history breast cancer  . Hypertension Mother   . Cancer Father     prostate  .  Alzheimer's disease Father   . Diabetes Father   . Hypertension Father   . Cancer Maternal Uncle     colon  . Colon cancer Maternal Uncle 51  . Cancer Maternal Grandmother     breast  . Cancer Maternal Grandfather     prostate  . Cancer Maternal Uncle     prostate  . Colon cancer Maternal Uncle 58  . Cancer Maternal Uncle     prostate  . Cancer Cousin 47    metastatic colon cancer?  . Colon polyps Paternal Uncle   . Esophageal cancer Neg Hx     History   Social History  . Marital Status: Married    Spouse Name: N/A    Number of Children: 6  . Years of Education: N/A   Social History Main Topics  . Smoking status: Never Smoker   . Smokeless tobacco: Never Used  . Alcohol Use: No  . Drug Use: No  . Sexual Activity: Yes    Partners: Female   Other Topics Concern  . None   Social History Narrative   Regular exercise: very little   Caffeine use: sweet tea daily  6 children   Works in [employer], owns concession.   Married   Enjoys televition.     Review of Systems - See HPI.  All other ROS are negative.  Filed Vitals:   09/07/13 1358  BP: 128/78  Pulse: 88  Temp: 97.6 F (36.4 C)  Resp: 16    Physical Exam  Vitals reviewed. Constitutional: He is oriented to person, place, and time and well-developed, well-nourished, and in no distress.  HENT:  Head: Normocephalic and atraumatic.  Right Ear: External ear normal.  Left Ear: External ear normal.  Nose: Nose normal.  Mouth/Throat: Oropharynx is clear and moist. No oropharyngeal exudate.  TM within normal limits bilaterally.  Tenderness to maxillary sinuses noted on examination.  Eyes: Conjunctivae are normal.  Neck: Neck supple.  Cardiovascular: Normal rate, regular rhythm, normal heart sounds and intact distal pulses.   Pulmonary/Chest: Effort normal and breath sounds normal. No respiratory distress. He has no wheezes. He has no rales. He exhibits no tenderness.  Lymphadenopathy:    He has no  cervical adenopathy.  Neurological: He is alert and oriented to person, place, and time.  Skin: Skin is warm and dry. No rash noted.  Psychiatric: Affect normal.     Recent Results (from the past 2160 hour(s))  CBC WITH DIFFERENTIAL     Status: None   Collection Time    08/05/13  3:24 PM      Result Value Range   WBC 9.6  4.0 - 10.5 K/uL   RBC 5.40  4.22 - 5.81 MIL/uL   Hemoglobin 15.2  13.0 - 17.0 g/dL   HCT 78.2  95.6 - 21.3 %   MCV 82.0  78.0 - 100.0 fL   MCH 28.1  26.0 - 34.0 pg   MCHC 34.3  30.0 - 36.0 g/dL   RDW 08.6  57.8 - 46.9 %   Platelets 326  150 - 400 K/uL   Neutrophils Relative % 63  43 - 77 %   Neutro Abs 6.1  1.7 - 7.7 K/uL   Lymphocytes Relative 28  12 - 46 %   Lymphs Abs 2.7  0.7 - 4.0 K/uL   Monocytes Relative 7  3 - 12 %   Monocytes Absolute 0.7  0.1 - 1.0 K/uL   Eosinophils Relative 1  0 - 5 %   Eosinophils Absolute 0.1  0.0 - 0.7 K/uL   Basophils Relative 0  0 - 1 %   Basophils Absolute 0.0  0.0 - 0.1 K/uL  D-DIMER, QUANTITATIVE     Status: None   Collection Time    08/05/13  3:24 PM      Result Value Range   D-Dimer, Quant <0.27  0.00 - 0.48 ug/mL-FEU   Comment:            AT THE INHOUSE ESTABLISHED CUTOFF     VALUE OF 0.48 ug/mL FEU,     THIS ASSAY HAS BEEN DOCUMENTED     IN THE LITERATURE TO HAVE     A SENSITIVITY AND NEGATIVE     PREDICTIVE VALUE OF AT LEAST     98 TO 99%.  THE TEST RESULT     SHOULD BE CORRELATED WITH     AN ASSESSMENT OF THE CLINICAL     PROBABILITY OF DVT / VTE.  BASIC METABOLIC PANEL     Status: Abnormal   Collection Time    08/05/13  3:54 PM      Result Value Range   Sodium  142  135 - 145 mEq/L   Potassium 4.0  3.5 - 5.1 mEq/L   Chloride 102  96 - 112 mEq/L   CO2 29  19 - 32 mEq/L   Glucose, Bld 105 (*) 70 - 99 mg/dL   BUN 9  6 - 23 mg/dL   Creatinine, Ser 2.95  0.50 - 1.35 mg/dL   Calcium 62.1  8.4 - 30.8 mg/dL   GFR calc non Af Amer >90  >90 mL/min   GFR calc Af Amer >90  >90 mL/min   Comment: (NOTE)      The eGFR has been calculated using the CKD EPI equation.     This calculation has not been validated in all clinical situations.     eGFR's persistently <90 mL/min signify possible Chronic Kidney     Disease.  TROPONIN I     Status: None   Collection Time    08/05/13  3:54 PM      Result Value Range   Troponin I <0.30  <0.30 ng/mL   Comment:            Due to the release kinetics of cTnI,     a negative result within the first hours     of the onset of symptoms does not rule out     myocardial infarction with certainty.     If myocardial infarction is still suspected,     repeat the test at appropriate intervals.    Assessment/Plan: Acute sinusitis with symptoms > 10 days Rx Augmentin.  Probiotic.  Increase fluid intake.  Rest.  Allegra-D.  Flonase and saline nasal spray.  Humidifier in bedroom.

## 2013-09-07 NOTE — Progress Notes (Signed)
Pre visit review using our clinic review tool, if applicable. No additional management support is needed unless otherwise documented below in the visit note/SLS  

## 2013-09-07 NOTE — Patient Instructions (Signed)
Please take antibiotic as prescribed.  Take it with food.  Increase fluid intake.  Saline nasal spray and Flonase one daily.  Continue with allergy medication.  Humidifier in bedroom.  Sinusitis Sinusitis is redness, soreness, and swelling (inflammation) of the paranasal sinuses. Paranasal sinuses are air pockets within the bones of your face (beneath the eyes, the middle of the forehead, or above the eyes). In healthy paranasal sinuses, mucus is able to drain out, and air is able to circulate through them by way of your nose. However, when your paranasal sinuses are inflamed, mucus and air can become trapped. This can allow bacteria and other germs to grow and cause infection. Sinusitis can develop quickly and last only a short time (acute) or continue over a long period (chronic). Sinusitis that lasts for more than 12 weeks is considered chronic.  CAUSES  Causes of sinusitis include:  Allergies.  Structural abnormalities, such as displacement of the cartilage that separates your nostrils (deviated septum), which can decrease the air flow through your nose and sinuses and affect sinus drainage.  Functional abnormalities, such as when the small hairs (cilia) that line your sinuses and help remove mucus do not work properly or are not present. SYMPTOMS  Symptoms of acute and chronic sinusitis are the same. The primary symptoms are pain and pressure around the affected sinuses. Other symptoms include:  Upper toothache.  Earache.  Headache.  Bad breath.  Decreased sense of smell and taste.  A cough, which worsens when you are lying flat.  Fatigue.  Fever.  Thick drainage from your nose, which often is green and may contain pus (purulent).  Swelling and warmth over the affected sinuses. DIAGNOSIS  Your caregiver will perform a physical exam. During the exam, your caregiver may:  Look in your nose for signs of abnormal growths in your nostrils (nasal polyps).  Tap over the affected  sinus to check for signs of infection.  View the inside of your sinuses (endoscopy) with a special imaging device with a light attached (endoscope), which is inserted into your sinuses. If your caregiver suspects that you have chronic sinusitis, one or more of the following tests may be recommended:  Allergy tests.  Nasal culture A sample of mucus is taken from your nose and sent to a lab and screened for bacteria.  Nasal cytology A sample of mucus is taken from your nose and examined by your caregiver to determine if your sinusitis is related to an allergy. TREATMENT  Most cases of acute sinusitis are related to a viral infection and will resolve on their own within 10 days. Sometimes medicines are prescribed to help relieve symptoms (pain medicine, decongestants, nasal steroid sprays, or saline sprays).  However, for sinusitis related to a bacterial infection, your caregiver will prescribe antibiotic medicines. These are medicines that will help kill the bacteria causing the infection.  Rarely, sinusitis is caused by a fungal infection. In theses cases, your caregiver will prescribe antifungal medicine. For some cases of chronic sinusitis, surgery is needed. Generally, these are cases in which sinusitis recurs more than 3 times per year, despite other treatments. HOME CARE INSTRUCTIONS   Drink plenty of water. Water helps thin the mucus so your sinuses can drain more easily.  Use a humidifier.  Inhale steam 3 to 4 times a day (for example, sit in the bathroom with the shower running).  Apply a warm, moist washcloth to your face 3 to 4 times a day, or as directed by your caregiver.  Use saline nasal sprays to help moisten and clean your sinuses.  Take over-the-counter or prescription medicines for pain, discomfort, or fever only as directed by your caregiver. SEEK IMMEDIATE MEDICAL CARE IF:  You have increasing pain or severe headaches.  You have nausea, vomiting, or  drowsiness.  You have swelling around your face.  You have vision problems.  You have a stiff neck.  You have difficulty breathing. MAKE SURE YOU:   Understand these instructions.  Will watch your condition.  Will get help right away if you are not doing well or get worse. Document Released: 09/29/2005 Document Revised: 12/22/2011 Document Reviewed: 10/14/2011 [facility] Patient Information 2014 [place], [place].

## 2013-09-07 NOTE — Assessment & Plan Note (Signed)
Rx Augmentin.  Probiotic.  Increase fluid intake.  Rest.  Allegra-D.  Flonase and saline nasal spray.  Humidifier in bedroom.

## 2013-09-13 ENCOUNTER — Other Ambulatory Visit: Payer: Self-pay | Admitting: Physician Assistant

## 2013-09-15 ENCOUNTER — Encounter (HOSPITAL_COMMUNITY): Payer: Self-pay | Admitting: Pharmacy Technician

## 2013-09-16 ENCOUNTER — Ambulatory Visit: Payer: BC Managed Care – PPO | Admitting: Family

## 2013-09-18 ENCOUNTER — Other Ambulatory Visit: Payer: Self-pay | Admitting: Physician Assistant

## 2013-09-18 NOTE — H&P (Signed)
TOTAL KNEE ADMISSION H&P  Patient is being admitted for left total knee arthroplasty.  Subjective:  Chief Complaint:left knee pain.  HPI: Steve [name], 52 y.o. male, has a history of pain and functional disability in the left knee due to arthritis and has failed non-surgical conservative treatments for greater than 12 weeks to includeNSAID's and/or analgesics, corticosteriod injections, flexibility and strengthening excercises and activity modification.  Onset of symptoms was gradual, starting 8 years ago with gradually worsening course since that time. The patient noted prior procedures on the knee to include  arthroscopy on the left knee(s).  Patient currently rates pain in the left knee(s) at 6 out of 10 with activity. Patient has night pain, worsening of pain with activity and weight bearing, pain that interferes with activities of daily living, pain with passive range of motion and crepitus.  Patient has evidence of periarticular osteophytes and joint space narrowing by imaging studies. There is no active infection.  Patient Active Problem List   Diagnosis Date Noted  . Acute sinusitis with symptoms > 10 days 09/07/2013  . Allergic rhinitis 09/07/2013  . Sinus tachycardia 08/05/2013  . Tachycardia 08/05/2013  . Atypical chest pain 07/15/2013  . Left ankle pain 06/14/2013  . Rib injury 06/14/2013  . Pain in left ankle w/ effusion 05/24/2013  . Routine general medical examination at a health care facility 04/11/2013  . Herpes zoster 11/05/2012  . Type II or unspecified type diabetes mellitus without mention of complication, not stated as uncontrolled 07/26/2012  . Hyperlipidemia 07/26/2012  . 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 ED (erectile dysfunction) 07/26/2012  . Hypogonadism male 07/26/2012   Past Medical History  Diagnosis Date  . Diabetes mellitus without complication   . Hypertension   . Hyperlipidemia   . Complication of anesthesia     has night  terrors after anesthesia but can be violent when waking up  . Headache(784.0)   . 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 Pt stated "I forget alot of things"  . Frequent urination at night     when blood sugar runs high  . Psoriasis of scalp     nose and face; occurs mainly in winter   . Seasonal allergies   . CAD (coronary artery disease)     Past Surgical History  Procedure Laterality Date  . Hernia repair  2010    abdominal hernia  . Knee arthroscopy  2012    left knee  . Cardiac catheterization      no PCI approx 10 years ago [facility]  . Finger surgery      right ring finger  . Knee surgery       (Not in a hospital admission) Allergies  Allergen Reactions  . Sulfur Itching    History  Substance Use Topics  . Smoking status: Never Smoker   . Smokeless tobacco: Never Used  . Alcohol Use: No    Family History  Problem Relation Age of Onset  . Diabetes Mother   . Cancer Mother     history breast cancer  . Hypertension Mother   . Cancer Father     prostate  . Alzheimer's disease Father   . Diabetes Father   . Hypertension Father   . Cancer Maternal Uncle     colon  . Colon cancer Maternal Uncle 51  . Cancer Maternal Grandmother     breast  . Cancer Maternal Grandfather  prostate  . Cancer Maternal Uncle     prostate  . Colon cancer Maternal Uncle 58  . Cancer Maternal Uncle     prostate  . Cancer Cousin 47    metastatic colon cancer?  . Colon polyps Paternal Uncle   . Esophageal cancer Neg Hx      Review of Systems  Constitutional: Negative.   HENT: Negative.   Eyes: Negative.   Respiratory: Negative.   Cardiovascular: Negative.   Gastrointestinal: Negative.   Genitourinary: Negative.   Musculoskeletal: Positive for joint pain.  Skin: Negative.   Neurological: Negative.   Endo/Heme/Allergies: Negative.   Psychiatric/Behavioral: Negative.     Objective:  Physical Exam  Constitutional: He is oriented to person,  place, and time. He appears well-developed and well-nourished.  HENT:  Head: Normocephalic and atraumatic.  Eyes: EOM are normal. Pupils are equal, round, and reactive to light.  Neck: Neck supple.  Cardiovascular: Normal rate, regular rhythm and normal heart sounds.   Respiratory: Effort normal and breath sounds normal. No respiratory distress. He has no wheezes. He has no rales.  GI: Soft. Bowel sounds are normal. He exhibits no distension. There is no tenderness.  Musculoskeletal:  Exam of his left knee reveals antalgic gait on the left.  Varus thrust. Fairly good extension and flexion better than 100 degrees.  Minimal flexion contracture.    Neurological: He is alert and oriented to person, place, and time.  Skin: Skin is warm and dry.  Psychiatric: He has a normal mood and affect.    Vital signs in last 24 hours: @VSRANGES @  Labs:   Estimated body mass index is 33.43 kg/(m^2) as calculated from the following:   Height as of 09/07/13: 6\' 2"  (1.88 m).   Weight as of 09/07/13: 118.162 kg (260 lb 8 oz).   Imaging Review Plain radiographs demonstrate severe degenerative joint disease of the left knee(s). The overall alignment ismild varus. The bone quality appears to be fair for age and reported activity level.  Assessment/Plan:  End stage arthritis, left knee   The patient history, physical examination, clinical judgment of the provider and imaging studies are consistent with end stage degenerative joint disease of the left knee(s) and total knee arthroplasty is deemed medically necessary. The treatment options including medical management, injection therapy arthroscopy and arthroplasty were discussed at length. The risks and benefits of total knee arthroplasty were presented and reviewed. The risks due to aseptic loosening, infection, stiffness, patella tracking problems, thromboembolic complications and other imponderables were discussed. The patient acknowledged the explanation,  agreed to proceed with the plan and consent was signed. Patient is being admitted for inpatient treatment for surgery, pain control, PT, OT, prophylactic antibiotics, VTE prophylaxis, progressive ambulation and ADL's and discharge planning. The patient is planning to be discharged home with home health services

## 2013-09-19 ENCOUNTER — Ambulatory Visit (INDEPENDENT_AMBULATORY_CARE_PROVIDER_SITE_OTHER): Payer: BC Managed Care – PPO | Admitting: Family

## 2013-09-19 ENCOUNTER — Encounter: Payer: Self-pay | Admitting: Family

## 2013-09-19 VITALS — BP 138/88 | HR 100 | Temp 97.6°F | Resp 16 | Ht 76.0 in | Wt 260.1 lb

## 2013-09-19 DIAGNOSIS — M1710 Unilateral primary osteoarthritis, unspecified knee: Secondary | ICD-10-CM

## 2013-09-19 DIAGNOSIS — E119 Type 2 diabetes mellitus without complications: Secondary | ICD-10-CM

## 2013-09-19 DIAGNOSIS — M179 Osteoarthritis of knee, unspecified: Secondary | ICD-10-CM

## 2013-09-19 DIAGNOSIS — IMO0002 Reserved for concepts with insufficient information to code with codable children: Secondary | ICD-10-CM

## 2013-09-19 LAB — HEMOGLOBIN A1C: Mean Plasma Glucose: 209 mg/dL — ABNORMAL HIGH (ref <117)

## 2013-09-19 NOTE — Assessment & Plan Note (Signed)
Clinically stable. Continue lantus, obtain A1C.

## 2013-09-19 NOTE — Assessment & Plan Note (Signed)
Await labs/cxr/urine prior to clearance.  Continue beta blocker perioperatively per cardiology recommendations.

## 2013-09-19 NOTE — Patient Instructions (Addendum)
Please keep your upcoming appointment at the hospital. Follow up with [place] in 3 months.

## 2013-09-19 NOTE — Progress Notes (Signed)
Subjective:    Patient ID: Steve [name], male    DOB: 08-30-1961, 52 y.o.   MRN: [id number]  HPI  Steve [name] is a 52 yr old male who presents today for pre-operative evaluation for TKA on 12/17.  Last visit he was referred back to cardiology for cardiac clearance. Since that time a beta blocker has been added to his regimen which he states he has started and is tolerating.  He is scheduled for pre-op blood work, urine and CXR on 12/10 in pre-admission testing at [facility].    DM2- reports that he continues lantus. Current dosing is 35 units. Reports sugars generally <200.  Due for A1C.   Review of Systems See HPI  Past Medical History  Diagnosis Date  . Diabetes mellitus without complication   . Hypertension   . Hyperlipidemia   . Complication of anesthesia     has night terrors after anesthesia but can be violent when waking up  . Headache(784.0)   . 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 Pt stated "I forget alot of things"  . Frequent urination at night     when blood sugar runs high  . Psoriasis of scalp     nose and face; occurs mainly in winter   . Seasonal allergies   . CAD (coronary artery disease)     History   Social History  . Marital Status: Married    Spouse Name: N/A    Number of Children: 6  . Years of Education: N/A   Occupational History  . Not on file.   Social History Main Topics  . Smoking status: Never Smoker   . Smokeless tobacco: Never Used  . Alcohol Use: No  . Drug Use: No  . Sexual Activity: Yes    Partners: Female   Other Topics Concern  . Not on file   Social History Narrative   Regular exercise: very little   Caffeine use: sweet tea daily   6 children   Works in [employer], owns concession.   Married   Enjoys televition.      Past Surgical History  Procedure Laterality Date  . Hernia repair  2010    abdominal hernia  . Knee arthroscopy  2012    left knee  . Cardiac catheterization      no PCI approx 10  years ago [facility]  . Finger surgery      right ring finger  . Knee surgery      Family History  Problem Relation Age of Onset  . Diabetes Mother   . Cancer Mother     history breast cancer  . Hypertension Mother   . Cancer Father     prostate  . Alzheimer's disease Father   . Diabetes Father   . Hypertension Father   . Cancer Maternal Uncle     colon  . Colon cancer Maternal Uncle 51  . Cancer Maternal Grandmother     breast  . Cancer Maternal Grandfather     prostate  . Cancer Maternal Uncle     prostate  . Colon cancer Maternal Uncle 58  . Cancer Maternal Uncle     prostate  . Cancer Cousin 47    metastatic colon cancer?  . Colon polyps Paternal Uncle   . Esophageal cancer Neg Hx     Allergies  Allergen Reactions  . Sulfur Itching    Current Outpatient Prescriptions on File Prior to  Visit  Medication Sig Dispense Refill  . aspirin 81 MG tablet Take 81 mg by mouth daily.      . fexofenadine-pseudoephedrine (ALLEGRA-D 24) 180-240 MG per 24 hr tablet Take 1 tablet by mouth daily.      . fluticasone (FLONASE) 50 MCG/ACT nasal spray Place 2 sprays into both nostrils daily.  16 g  6  . insulin glargine (LANTUS) 100 UNIT/ML injection Inject 25-30 Units into the skin at bedtime. Sliding scale      . lisinopril (PRINIVIL,ZESTRIL) 2.5 MG tablet Take 2.5 mg by mouth daily.      . metFORMIN (GLUCOPHAGE) 1000 MG tablet Take 1,000 mg by mouth 2 (two) times daily with a meal.      . metoprolol succinate (TOPROL XL) 25 MG 24 hr tablet Take 0.5 tablets (12.5 mg total) by mouth daily.  45 tablet  3  . Multiple Vitamins-Minerals (ZINC PO) Take 2 tablets by mouth every morning.      . simvastatin (ZOCOR) 40 MG tablet Take 40 mg by mouth at bedtime.      . sitaGLIPtin (JANUVIA) 100 MG tablet Take 100 mg by mouth daily.       No current facility-administered medications on file prior to visit.    BP 138/88  Pulse 100  Temp(Src) 97.6 F (36.4 C) (Oral)  Resp 16  Ht 6'  4" (1.93 m)  Wt 260 lb 1.3 oz (117.972 kg)  BMI 31.67 kg/m2       Objective:   Physical Exam  Constitutional: He is oriented to person, place, and time. He appears well-developed and well-nourished. No distress.  Cardiovascular: Normal rate and regular rhythm.   No murmur heard. Pulmonary/Chest: Effort normal and breath sounds normal. No respiratory distress. He has no wheezes. He has no rales. He exhibits no tenderness.  Musculoskeletal: He exhibits no edema.  Neurological: He is alert and oriented to person, place, and time.  Skin: Skin is warm and dry.  Psychiatric: He has a normal mood and affect. His behavior is normal. Judgment and thought content normal.          Assessment & Plan:

## 2013-09-20 ENCOUNTER — Telehealth: Payer: Self-pay | Admitting: Family

## 2013-09-20 MED ORDER — INSULIN ASPART 100 UNIT/ML FLEXPEN: PEN_INJECTOR | SUBCUTANEOUS | Status: DC

## 2013-09-20 NOTE — Telephone Encounter (Signed)
Notified pt and he voices understanding. Rx faxed to [facility] on [address] per pt request.

## 2013-09-20 NOTE — Telephone Encounter (Signed)
Sugar is uncontrolled.  I would like him to add novolog sliding scale coverage. Continue lantus.  Check sugar before each meal. Dose novolog prior to each meal based on sugar reading.  Call if sugars >300 or <80.

## 2013-09-21 ENCOUNTER — Encounter (HOSPITAL_COMMUNITY): Payer: Self-pay

## 2013-09-21 ENCOUNTER — Encounter (HOSPITAL_COMMUNITY)
Admission: RE | Admit: 2013-09-21 | Discharge: 2013-09-21 | Disposition: A | Discharge status: Home / Self Care | Payer: BC Managed Care – PPO | Source: Ambulatory Visit | Attending: Orthopedic Surgery | Admitting: Orthopedic Surgery

## 2013-09-21 DIAGNOSIS — Z01818 Encounter for other preprocedural examination: Secondary | ICD-10-CM | POA: Insufficient documentation

## 2013-09-21 DIAGNOSIS — Z01812 Encounter for preprocedural laboratory examination: Secondary | ICD-10-CM | POA: Insufficient documentation

## 2013-09-21 LAB — CBC WITH DIFFERENTIAL/PLATELET
Basophils Absolute: 0 10*3/uL (ref 0.0–0.1)
HCT: 42.8 % (ref 39.0–52.0)
Hemoglobin: 14.3 g/dL (ref 13.0–17.0)
Lymphocytes Relative: 31 % (ref 12–46)
Monocytes Absolute: 0.4 10*3/uL (ref 0.1–1.0)
Monocytes Relative: 7 % (ref 3–12)
Neutro Abs: 3.1 10*3/uL (ref 1.7–7.7)
Platelets: 292 10*3/uL (ref 150–400)
RDW: 13.1 % (ref 11.5–15.5)
WBC: 5.3 10*3/uL (ref 4.0–10.5)

## 2013-09-21 LAB — COMPREHENSIVE METABOLIC PANEL
ALT: 28 U/L (ref 0–53)
AST: 21 U/L (ref 0–37)
Alkaline Phosphatase: 60 U/L (ref 39–117)
CO2: 27 mEq/L (ref 19–32)
Chloride: 100 mEq/L (ref 96–112)
Creatinine, Ser: 0.79 mg/dL (ref 0.50–1.35)
GFR calc Af Amer: >90 mL/min (ref >90)
GFR calc non Af Amer: >90 mL/min (ref >90)
Sodium: 139 mEq/L (ref 135–145)
Total Bilirubin: 0.5 mg/dL (ref 0.3–1.2)

## 2013-09-21 LAB — PROTIME-INR: INR: 0.97 (ref 0.00–1.49)

## 2013-09-21 LAB — URINALYSIS, ROUTINE W REFLEX MICROSCOPIC
Bilirubin Urine: NEGATIVE
Glucose, UA: NEGATIVE mg/dL
Hgb urine dipstick: NEGATIVE
Protein, ur: NEGATIVE mg/dL
Urobilinogen, UA: 0.2 mg/dL (ref 0.0–1.0)
pH: 7 (ref 5.0–8.0)

## 2013-09-21 LAB — APTT: aPTT: 30 seconds (ref 24–37)

## 2013-09-21 LAB — SURGICAL PCR SCREEN: MRSA, PCR: NEGATIVE

## 2013-09-21 NOTE — Pre-Procedure Instructions (Signed)
TAEDEN GELLER  09/21/2013   Your procedure is scheduled on:  09-28-2013 Wednesday   Report to [facility]  2 * 3 at 10:00 AM.   Call this number if you have problems the morning of surgery: [phone]   Remember:   Do not eat food or drink liquids after midnight.    Take these medicines the morning of surgery with A SIP OF WATER: allegraD,flonase nasal spray,metoprolol,              No diabetes medications the morning of surgery   Do not wear jewelry,  Do not wear lotions, powders, or perfumes.   Do not shave 48 hours prior to surgery. Men may shave face and neck.  Do not bring valuables to the hospital.  [facility] is not responsible  for any belongings or valuables.               Contacts, dentures or bridgework may not be worn into surgery.   Leave suitcase in the car. After surgery it may be brought to your room.  For patients admitted to the hospital, discharge time is determined by your  treatment team.               Patients discharged the day of surgery will not be allowed to drive home.     Special Instructions: Shower using CHG 2 nights before surgery and the night before surgery.  If you shower the day of surgery use CHG.  Use special wash - you have one bottle of CHG for all showers.  You should use approximately 1/3 of the bottle for each shower.   Please read over the following fact sheets that you were given: Pain Booklet, Coughing and Deep Breathing, Blood Transfusion Information and Surgical Site Infection Prevention

## 2013-09-22 ENCOUNTER — Telehealth: Payer: Self-pay | Admitting: Family

## 2013-09-22 LAB — URINE CULTURE
Colony Count: NO GROWTH
Culture: NO GROWTH

## 2013-09-22 NOTE — Telephone Encounter (Signed)
DOCUMENTS FOR SURGICAL CLEARANCE FAXED 09-22-2013

## 2013-09-26 ENCOUNTER — Telehealth: Payer: Self-pay | Admitting: Family

## 2013-09-26 ENCOUNTER — Ambulatory Visit (INDEPENDENT_AMBULATORY_CARE_PROVIDER_SITE_OTHER): Payer: BC Managed Care – PPO | Admitting: Family

## 2013-09-26 ENCOUNTER — Encounter: Payer: Self-pay | Admitting: Family

## 2013-09-26 VITALS — BP 140/86 | HR 102 | Temp 97.8°F | Resp 16 | Ht 76.0 in | Wt 264.0 lb

## 2013-09-26 DIAGNOSIS — J069 Acute upper respiratory infection, unspecified: Secondary | ICD-10-CM

## 2013-09-26 NOTE — Progress Notes (Signed)
Subjective:    Patient ID: Steve [name], male    DOB: [birth year], 52 y.o.   MRN: [id number]  HPI  Steve [name] is a 52 yr old male who presents today with chief complaint of cough.  Symptoms started on 12/13.  Cough is worsening- especially in the evenings. Cough is dry.  Not keeping him up- he has been taking nyquil. He denies associated fever, or nasal drainage. Reports + associated sore throat.  Denies sick contacts.   Review of Systems See HPI  Past Medical History  Diagnosis Date  . Diabetes mellitus without complication   . Hypertension   . Hyperlipidemia   . Complication of anesthesia     has night terrors after anesthesia but can be violent when waking up  . 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 Pt stated "I forget alot of things"  . Frequent urination at night     when blood sugar runs high  . Psoriasis of scalp     nose and face; occurs mainly in winter   . Seasonal allergies   . CAD (coronary artery disease)   . Headache(784.0)     History   Social History  . Marital Status: Married    Spouse Name: N/A    Number of Children: 6  . Years of Education: N/A   Occupational History  . Not on file.   Social History Main Topics  . Smoking status: Never Smoker   . Smokeless tobacco: Never Used  . Alcohol Use: No  . Drug Use: No  . Sexual Activity: Yes    Partners: Female   Other Topics Concern  . Not on file   Social History Narrative   Regular exercise: very little   Caffeine use: sweet tea daily   6 children   Works in [employer], owns concession.   Married   Enjoys televition.      Past Surgical History  Procedure Laterality Date  . Hernia repair  2010    abdominal hernia  . Knee arthroscopy  2012    left knee  . Cardiac catheterization      no PCI approx 10 years ago [facility]  . Finger surgery      right ring finger  . Knee surgery      Family History  Problem Relation Age of Onset  . Diabetes Mother   .  Cancer Mother     history breast cancer  . Hypertension Mother   . Cancer Father     prostate  . Alzheimer's disease Father   . Diabetes Father   . Hypertension Father   . Cancer Maternal Uncle     colon  . Colon cancer Maternal Uncle 51  . Cancer Maternal Grandmother     breast  . Cancer Maternal Grandfather     prostate  . Cancer Maternal Uncle     prostate  . Colon cancer Maternal Uncle 58  . Cancer Maternal Uncle     prostate  . Cancer Cousin 47    metastatic colon cancer?  . Colon polyps Paternal Uncle   . Esophageal cancer Neg Hx     Allergies  Allergen Reactions  . Sulfur Itching    Current Outpatient Prescriptions on File Prior to Visit  Medication Sig Dispense Refill  . aspirin 81 MG tablet Take 81 mg by mouth daily.      . fexofenadine-pseudoephedrine (ALLEGRA-D 24) 180-240 MG per 24 hr tablet  Take 1 tablet by mouth daily.      . fluticasone (FLONASE) 50 MCG/ACT nasal spray Place 2 sprays into both nostrils daily.  16 g  6  . insulin aspart (NOVOLOG) 100 UNIT/ML SOPN FlexPen sliding scale- check sugar and inject 3 times daily before meals as below:  <150-   Zero units 150-200 2 units 201-250 4 units 251-300 6 units 301-350 8 units 351-400 10 units >400             12 units and contact us.  3 pen  4  . insulin glargine (LANTUS) 100 UNIT/ML injection Inject 35 Units into the skin at bedtime. Sliding scale      . lisinopril (PRINIVIL,ZESTRIL) 2.5 MG tablet Take 2.5 mg by mouth daily.      . metFORMIN (GLUCOPHAGE) 1000 MG tablet Take 1,000 mg by mouth 2 (two) times daily with a meal.      . metoprolol succinate (TOPROL XL) 25 MG 24 hr tablet Take 0.5 tablets (12.5 mg total) by mouth daily.  45 tablet  3  . Multiple Vitamins-Minerals (ZINC PO) Take 2 tablets by mouth every morning.      . simvastatin (ZOCOR) 40 MG tablet Take 40 mg by mouth at bedtime.      . sitaGLIPtin (JANUVIA) 100 MG tablet Take 100 mg by mouth daily.       No current  facility-administered medications on file prior to visit.    BP 140/86  Pulse 102  Temp(Src) 97.8 F (36.6 C) (Oral)  Resp 16  Ht 6\' 4"  (1.93 m)  Wt 264 lb (119.75 kg)  BMI 32.15 kg/m2  SpO2 96%       Objective:   Physical Exam  Constitutional: He appears well-developed and well-nourished. No distress.  HENT:  Head: Normocephalic and atraumatic.  Right Ear: Tympanic membrane and ear canal normal.  Left Ear: Tympanic membrane and ear canal normal.  Mouth/Throat: No oropharyngeal exudate, posterior oropharyngeal edema, posterior oropharyngeal erythema or tonsillar abscesses.  Hoarseness noted  Cardiovascular: Normal rate and regular rhythm.   No murmur heard. Pulmonary/Chest: Effort normal and breath sounds normal. No respiratory distress. He has no wheezes. He has no rales. He exhibits no tenderness.  Lymphadenopathy:    He has no cervical adenopathy.          Assessment & Plan:

## 2013-09-26 NOTE — Patient Instructions (Signed)
You may use delsym as needed for cough. Please call if symptoms worsen or if symptoms are not improved in 2-3 days.

## 2013-09-26 NOTE — Telephone Encounter (Signed)
Please contact Dr. Greig Right office tomorrow and let them know that pt has URI with bad cough and I am recommending that surgery be postponed at least 1 week until he is feeling better. Surgery is currently scheduled for 12/17.

## 2013-09-26 NOTE — Progress Notes (Signed)
Pre visit review using our clinic review tool, if applicable. No additional management support is needed unless otherwise documented below in the visit note. 

## 2013-09-26 NOTE — Assessment & Plan Note (Signed)
Recommended delsym prn, follow up if symptoms worsen or if symptoms do not improve. Advised pt that I would recommend postponing his surgery until symptoms resolved. We will notify Dr. Greig Right office of your recommendation. Pt verbalizes understanding.

## 2013-09-27 MED ORDER — LACTATED RINGERS IV SOLN: INTRAVENOUS | Status: DC

## 2013-09-27 MED ORDER — CEFAZOLIN SODIUM-DEXTROSE 2-3 GM-% IV SOLR: 2.0000 g | INTRAVENOUS | Status: DC

## 2013-09-27 MED ORDER — CHLORHEXIDINE GLUCONATE 4 % EX LIQD: 60.0000 mL | Freq: Once | CUTANEOUS | Status: DC

## 2013-09-27 NOTE — Telephone Encounter (Signed)
Spoke with Tresa Endo at [facility] and cancelled upcoming surgery for tomorrow. She requests that we fax them a letter clearing pt of his current URI to  Fax) [phone] Attn: Sherri.  Called pt and scheduled a follow up for 10/04/13 at 9:45am. Pt wanted to know if there was anything that we could prescribe for his current symptoms.  Pt is currently doing salt water gargles and throat lozenges and using delsym for his cough. Advised pt per verbal from Provider to call if symptoms are not improved after 3 days or if symptoms worsen. Pt voices understanding.

## 2013-09-27 NOTE — Progress Notes (Signed)
Notified patient of time change and instructed him to arrive at 830 am  09/28/13.

## 2013-09-28 ENCOUNTER — Inpatient Hospital Stay (HOSPITAL_COMMUNITY)
Admission: RE | Admit: 2013-09-28 | Payer: BC Managed Care – PPO | Source: Ambulatory Visit | Admitting: Orthopedic Surgery

## 2013-09-28 ENCOUNTER — Encounter (HOSPITAL_COMMUNITY): Admission: RE | Payer: Self-pay | Source: Ambulatory Visit

## 2013-09-28 ENCOUNTER — Telehealth: Payer: Self-pay | Admitting: Family

## 2013-09-28 SURGERY — ARTHROPLASTY, KNEE, TOTAL
Anesthesia: General | Laterality: Left

## 2013-09-28 NOTE — Telephone Encounter (Signed)
Patient wife called in wanting to know if Steve [name] would prescribe tamiflu for patient?

## 2013-09-28 NOTE — Telephone Encounter (Signed)
I have not personally evaluated the patient.  Unfortunately for the patient even if he has the flu, Tamiflu will not work if his symptoms have been present for more than 48 hours.  I cannot personally prescribe an antibiotic without seeing the patient.  I would advise him to return to clinic for re-evaluation, or this can be sent to [facility] for further decision making.

## 2013-09-28 NOTE — Telephone Encounter (Signed)
WAS IN TO SEE MELISSA Monday AND TODAY  HE IS FEELING WORSE.  HAS NOT BEEN OUT OF BED SINCE HE CAME HOME FROM THE DOCTOR.  HE IS [place]  SAYS HIS EYE LIDS HURT AND HAD A FEVER LAST NIGHT.  FEELS HE NEEDS SOME TAMIFLU TO [facility] ON [address]

## 2013-09-29 ENCOUNTER — Encounter: Payer: Self-pay | Admitting: Physician Assistant

## 2013-09-29 ENCOUNTER — Ambulatory Visit (INDEPENDENT_AMBULATORY_CARE_PROVIDER_SITE_OTHER): Payer: BC Managed Care – PPO | Admitting: Physician Assistant

## 2013-09-29 VITALS — BP 122/88 | HR 104 | Temp 98.6°F | Resp 18 | Ht 76.0 in | Wt 259.5 lb

## 2013-09-29 DIAGNOSIS — J329 Chronic sinusitis, unspecified: Secondary | ICD-10-CM

## 2013-09-29 MED ORDER — AZITHROMYCIN 250 MG PO TABS: ORAL_TABLET | ORAL | Status: DC

## 2013-09-29 NOTE — Telephone Encounter (Signed)
I would recommend that he return to clinic today for re-evaluation please.

## 2013-09-29 NOTE — Telephone Encounter (Signed)
Spoke with patient; appt scheduled at 2:15p today/SLS

## 2013-09-29 NOTE — Progress Notes (Signed)
Pre visit review using our clinic review tool, if applicable. No additional management support is needed unless otherwise documented below in the visit note/SLS  

## 2013-09-29 NOTE — Progress Notes (Signed)
Patient ID: Steve [name], male   DOB: 20-Jun-1961, 52 y.o.   MRN: [id number]  Patient presents to clinic today complaining of several days of sinus pressure, sinus pain, ear pain, intermittent fevers, nonproductive cough and fatigue. Patient was evaluated in office by Sandford Craze 3 days ago, and diagnosed with viral URI. Patient states his symptoms have progressively gotten worse. Has noted fever again this morning. Has taken Tylenol. Temperature now 98.6 in clinic. Patient denies recent travel or sick contact. Personally evaluated patient around one month ago for acute sinusitis. Patient endorses that his symptoms had completely resolved. Does endorse history of seasonal allergy. Denies history of asthma.   Past Medical History  Diagnosis Date  . Diabetes mellitus without complication   . Hypertension   . Hyperlipidemia   . Complication of anesthesia     has night terrors after anesthesia but can be violent when waking up  . 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 Pt stated "I forget alot of things"  . Frequent urination at night     when blood sugar runs high  . Psoriasis of scalp     nose and face; occurs mainly in winter   . Seasonal allergies   . CAD (coronary artery disease)   . [id number](478.2)     Current Outpatient Prescriptions on File Prior to Visit  Medication Sig Dispense Refill  . fexofenadine-pseudoephedrine (ALLEGRA-D 24) 180-240 MG per 24 hr tablet Take 1 tablet by mouth daily.      . fluticasone (FLONASE) 50 MCG/ACT nasal spray Place 2 sprays into both nostrils daily.  16 g  6  . insulin aspart (NOVOLOG) 100 UNIT/ML SOPN FlexPen sliding scale- check sugar and inject 3 times daily before meals as below:  <150-   Zero units 150-200 2 units 201-250 4 units 251-300 6 units 301-350 8 units 351-400 10 units >400             12 units and contact us.  3 pen  4  . insulin glargine (LANTUS) 100 UNIT/ML injection Inject 35 Units into the skin at bedtime.  Sliding scale      . lisinopril (PRINIVIL,ZESTRIL) 2.5 MG tablet Take 2.5 mg by mouth daily.      . metFORMIN (GLUCOPHAGE) 1000 MG tablet Take 1,000 mg by mouth 2 (two) times daily with a meal.      . metoprolol succinate (TOPROL XL) 25 MG 24 hr tablet Take 0.5 tablets (12.5 mg total) by mouth daily.  45 tablet  3  . Multiple Vitamins-Minerals (ZINC PO) Take 2 tablets by mouth every morning.      . simvastatin (ZOCOR) 40 MG tablet Take 40 mg by mouth at bedtime.      . sitaGLIPtin (JANUVIA) 100 MG tablet Take 100 mg by mouth daily.      Marland Kitchen aspirin 81 MG tablet Take 81 mg by mouth daily.       No current facility-administered medications on file prior to visit.    Allergies  Allergen Reactions  . Sulfur Itching    Family History  Problem Relation Age of Onset  . Diabetes Mother   . Cancer Mother     history breast cancer  . Hypertension Mother   . Cancer Father     prostate  . Alzheimer's disease Father   . Diabetes Father   . Hypertension Father   . Cancer Maternal Uncle     colon  . Colon cancer Maternal Uncle  51  . Cancer Maternal Grandmother     breast  . Cancer Maternal Grandfather     prostate  . Cancer Maternal Uncle     prostate  . Colon cancer Maternal Uncle 58  . Cancer Maternal Uncle     prostate  . Cancer Cousin 47    metastatic colon cancer?  . Colon polyps Paternal Uncle   . Esophageal cancer Neg Hx     History   Social History  . Marital Status: Married    Spouse Name: N/A    Number of Children: 6  . Years of Education: N/A   Social History Main Topics  . Smoking status: Never Smoker   . Smokeless tobacco: Never Used  . Alcohol Use: No  . Drug Use: No  . Sexual Activity: Yes    Partners: Female   Other Topics Concern  . None   Social History Narrative   Regular exercise: very little   Caffeine use: sweet tea daily   6 children   Works in [employer] representative, owns concession.   Married   Enjoys televition.      Review of Systems - see  history of present illness. All other review of systems are negative.   Filed Vitals:   09/29/13 1427  BP: 122/88  Pulse: 104  Temp: 98.6 F (37 C)  Resp: 18   Physical Exam  Vitals reviewed. Constitutional: He is oriented to person, place, and time and well-developed, well-nourished, and in no distress.  HENT:  Head: Normocephalic and atraumatic.  Right Ear: External ear normal.  Left Ear: External ear normal.  Nose: Nose normal.  Mouth/Throat: Oropharynx is clear and moist. No oropharyngeal exudate.  Tympanic membranes within normal limits bilaterally. Tenderness to percussion over her right frontal and right maxillary sinuses.  Eyes: Conjunctivae are normal.  Neck: Neck supple.  Cardiovascular: Normal rate, regular rhythm, normal heart sounds and intact distal pulses.   Pulmonary/Chest: Effort normal and breath sounds normal. No respiratory distress. He has no wheezes. He has no rales. He exhibits no tenderness.  Lymphadenopathy:    He has no cervical adenopathy.  Neurological: He is alert and oriented to person, place, and time.  Skin: Skin is warm and dry. No rash noted.  Psychiatric: Affect normal.     Recent Results (from the past 2160 hour(s))  CBC WITH DIFFERENTIAL     Status: None   Collection Time    08/05/13  3:24 PM      Result Value Range   WBC 9.6  4.0 - 10.5 K/uL   RBC 5.40  4.22 - 5.81 MIL/uL   Hemoglobin 15.2  13.0 - 17.0 g/dL   HCT 09.8  11.9 - 14.7 %   MCV 82.0  78.0 - 100.0 fL   MCH 28.1  26.0 - 34.0 pg   MCHC 34.3  30.0 - 36.0 g/dL   RDW 82.9  56.2 - 13.0 %   Platelets 326  150 - 400 K/uL   Neutrophils Relative % 63  43 - 77 %   Neutro Abs 6.1  1.7 - 7.7 K/uL   Lymphocytes Relative 28  12 - 46 %   Lymphs Abs 2.7  0.7 - 4.0 K/uL   Monocytes Relative 7  3 - 12 %   Monocytes Absolute 0.7  0.1 - 1.0 K/uL   Eosinophils Relative 1  0 - 5 %   Eosinophils Absolute 0.1  0.0 - 0.7 K/uL   Basophils Relative 0  0 - 1 %  Basophils Absolute 0.0  0.0 -  0.1 K/uL  D-DIMER, QUANTITATIVE     Status: None   Collection Time    08/05/13  3:24 PM      Result Value Range   D-Dimer, Quant <0.27  0.00 - 0.48 ug/mL-FEU   Comment:            AT THE INHOUSE ESTABLISHED CUTOFF     VALUE OF 0.48 ug/mL FEU,     THIS ASSAY HAS BEEN DOCUMENTED     IN THE LITERATURE TO HAVE     A SENSITIVITY AND NEGATIVE     PREDICTIVE VALUE OF AT LEAST     98 TO 99%.  THE TEST RESULT     SHOULD BE CORRELATED WITH     AN ASSESSMENT OF THE CLINICAL     PROBABILITY OF DVT / VTE.  BASIC METABOLIC PANEL     Status: Abnormal   Collection Time    08/05/13  3:54 PM      Result Value Range   Sodium 142  135 - 145 mEq/L   Potassium 4.0  3.5 - 5.1 mEq/L   Chloride 102  96 - 112 mEq/L   CO2 29  19 - 32 mEq/L   Glucose, Bld 105 (*) 70 - 99 mg/dL   BUN 9  6 - 23 mg/dL   Creatinine, Ser 1.61  0.50 - 1.35 mg/dL   Calcium 09.6  8.4 - 04.5 mg/dL   GFR calc non Af Amer >90  >90 mL/min   GFR calc Af Amer >90  >90 mL/min   Comment: (NOTE)     The eGFR has been calculated using the CKD EPI equation.     This calculation has not been validated in all clinical situations.     eGFR's persistently <90 mL/min signify possible Chronic Kidney     Disease.  TROPONIN I     Status: None   Collection Time    08/05/13  3:54 PM      Result Value Range   Troponin I <0.30  <0.30 ng/mL   Comment:            Due to the release kinetics of cTnI,     a negative result within the first hours     of the onset of symptoms does not rule out     myocardial infarction with certainty.     If myocardial infarction is still suspected,     repeat the test at appropriate intervals.  HEMOGLOBIN A1C     Status: Abnormal   Collection Time    09/19/13 12:22 PM      Result Value Range   Hemoglobin A1C 8.9 (*) <5.7 %   Comment:                                                                            According to the ADA Clinical Practice Recommendations for 2011, when     HbA1c is used as a screening  test:             >=6.5%   Diagnostic of Diabetes Mellitus                (if abnormal  result is confirmed)           5.7-6.4%   Increased risk of developing Diabetes Mellitus           References:Diagnosis and Classification of Diabetes Mellitus,Diabetes     Care,2011,34(Suppl 1):S62-S69 and Standards of Medical Care in             Diabetes - 2011,Diabetes Care,2011,34 (Suppl 1):S11-S61.         Mean Plasma Glucose 209 (*) <117 mg/dL  SURGICAL PCR SCREEN     Status: None   Collection Time    09/21/13 11:43 AM      Result Value Range   MRSA, PCR NEGATIVE  NEGATIVE   Staphylococcus aureus NEGATIVE  NEGATIVE   Comment:            The Xpert SA Assay (FDA     approved for NASAL specimens     in patients over 48 years of age),     is one component of     a comprehensive surveillance     program.  Test performance has     been validated by [employer] for patients greater     than or equal to 29 year old.     It is not intended     to diagnose infection nor to     guide or monitor treatment.  URINALYSIS, ROUTINE W REFLEX MICROSCOPIC     Status: None   Collection Time    09/21/13 11:44 AM      Result Value Range   Color, Urine YELLOW  YELLOW   APPearance CLEAR  CLEAR   Specific Gravity, Urine 1.016  1.005 - 1.030   pH 7.0  5.0 - 8.0   Glucose, UA NEGATIVE  NEGATIVE mg/dL   Hgb urine dipstick NEGATIVE  NEGATIVE   Bilirubin Urine NEGATIVE  NEGATIVE   Ketones, ur NEGATIVE  NEGATIVE mg/dL   Protein, ur NEGATIVE  NEGATIVE mg/dL   Urobilinogen, UA 0.2  0.0 - 1.0 mg/dL   Nitrite NEGATIVE  NEGATIVE   Leukocytes, UA NEGATIVE  NEGATIVE   Comment: MICROSCOPIC NOT DONE ON URINES WITH NEGATIVE PROTEIN, BLOOD, LEUKOCYTES, NITRITE, OR GLUCOSE <1000 mg/dL.  URINE CULTURE     Status: None   Collection Time    09/21/13 11:44 AM      Result Value Range   Specimen Description URINE, CLEAN CATCH     Special Requests NONE     Culture  Setup Time       Value: 09/21/2013 11:44      Performed at [facility]   Colony Count       Value: NO GROWTH     Performed at [facility]   Culture       Value: NO GROWTH     Performed at [facility]   Report Status 09/22/2013 FINAL    APTT     Status: None   Collection Time    09/21/13 11:45 AM      Result Value Range   aPTT 30  24 - 37 seconds  CBC WITH DIFFERENTIAL     Status: None   Collection Time    09/21/13 11:45 AM      Result Value Range   WBC 5.3  4.0 - 10.5 K/uL   RBC 5.19  4.22 - 5.81 MIL/uL   Hemoglobin 14.3  13.0 - 17.0 g/dL   HCT 16.1  09.6 - 04.5 %  MCV 82.5  78.0 - 100.0 fL   MCH 27.6  26.0 - 34.0 pg   MCHC 33.4  30.0 - 36.0 g/dL   RDW 29.5  28.4 - 13.2 %   Platelets 292  150 - 400 K/uL   Neutrophils Relative % 59  43 - 77 %   Neutro Abs 3.1  1.7 - 7.7 K/uL   Lymphocytes Relative 31  12 - 46 %   Lymphs Abs 1.6  0.7 - 4.0 K/uL   Monocytes Relative 7  3 - 12 %   Monocytes Absolute 0.4  0.1 - 1.0 K/uL   Eosinophils Relative 2  0 - 5 %   Eosinophils Absolute 0.1  0.0 - 0.7 K/uL   Basophils Relative 0  0 - 1 %   Basophils Absolute 0.0  0.0 - 0.1 K/uL  COMPREHENSIVE METABOLIC PANEL     Status: Abnormal   Collection Time    09/21/13 11:45 AM      Result Value Range   Sodium 139  135 - 145 mEq/L   Potassium 4.3  3.5 - 5.1 mEq/L   Chloride 100  96 - 112 mEq/L   CO2 27  19 - 32 mEq/L   Glucose, Bld 130 (*) 70 - 99 mg/dL   BUN 9  6 - 23 mg/dL   Creatinine, Ser 4.40  0.50 - 1.35 mg/dL   Calcium 9.3  8.4 - 10.2 mg/dL   Total Protein 7.2  6.0 - 8.3 g/dL   Albumin 4.0  3.5 - 5.2 g/dL   AST 21  0 - 37 U/L   ALT 28  0 - 53 U/L   Alkaline Phosphatase 60  39 - 117 U/L   Total Bilirubin 0.5  0.3 - 1.2 mg/dL   GFR calc non Af Amer >90  >90 mL/min   GFR calc Af Amer >90  >90 mL/min   Comment: (NOTE)     The eGFR has been calculated using the CKD EPI equation.     This calculation has not been validated in all clinical situations.     eGFR's persistently <90 mL/min signify possible  Chronic Kidney     Disease.  PROTIME-INR     Status: None   Collection Time    09/21/13 11:45 AM      Result Value Range   Prothrombin Time 12.7  11.6 - 15.2 seconds   INR 0.97  0.00 - 1.49  TYPE AND SCREEN     Status: None   Collection Time    09/21/13 11:55 AM      Result Value Range   ABO/RH(D) O NEG     Antibody Screen POS     Sample Expiration 10/05/2013     Antibody Identification ANTI-M     PT AG Type NEGATIVE FOR M ANTIGEN     DAT, IgG NEG     Unit Number [id number]     Blood Component Type RED CELLS,LR     Unit division 00     Status of Unit ALLOCATED     Donor AG Type NEGATIVE FOR M ANTIGEN     Transfusion Status OK TO TRANSFUSE     Crossmatch Result COMPATIBLE     Unit Number [id number]     Blood Component Type RED CELLS,LR     Unit division 00     Status of Unit ALLOCATED     Donor AG Type NEGATIVE FOR M ANTIGEN     Transfusion Status OK TO TRANSFUSE  Crossmatch Result COMPATIBLE      Assessment/Plan: Sinusitis Rx azithromycin. Rest. Increase fluid intake. Humidifier in bedroom. Continue Allegra and Flonase. Saline nasal spray. Probiotic. If symptoms persist or sinus infections become recurrent, patient will need to be evaluated by ENT.

## 2013-09-29 NOTE — Assessment & Plan Note (Signed)
Rx azithromycin. Rest. Increase fluid intake. Humidifier in bedroom. Continue Allegra and Flonase. Saline nasal spray. Probiotic. If symptoms persist or sinus infections become recurrent, patient will need to be evaluated by ENT.

## 2013-09-29 NOTE — Patient Instructions (Signed)
Increase fluid intake.  Rest.  Humidifier in bedroom.  Saline nasal spray.  Flonase.  Probiotic.  Continue allergy medications.  If symptoms recur or do not improve, may need to be evaluated by an ENT.

## 2013-10-04 ENCOUNTER — Encounter: Payer: Self-pay | Admitting: Family

## 2013-10-04 ENCOUNTER — Ambulatory Visit (INDEPENDENT_AMBULATORY_CARE_PROVIDER_SITE_OTHER): Payer: BC Managed Care – PPO | Admitting: Family

## 2013-10-04 ENCOUNTER — Telehealth: Payer: Self-pay | Admitting: *Deleted

## 2013-10-04 VITALS — BP 132/86 | HR 80 | Temp 97.7°F | Resp 16 | Ht 76.8 in | Wt 262.0 lb

## 2013-10-04 DIAGNOSIS — J329 Chronic sinusitis, unspecified: Secondary | ICD-10-CM

## 2013-10-04 NOTE — Progress Notes (Signed)
Pre visit review using our clinic review tool, if applicable. No additional management support is needed unless otherwise documented below in the visit note. 

## 2013-10-04 NOTE — Telephone Encounter (Signed)
Pt seen in office today and cleared for upcoming knee surgery. Letter faxed to [phone].

## 2013-10-04 NOTE — Progress Notes (Signed)
Subjective:    Patient ID: Steve [name], male    DOB: [birth year], 52 y.o.   MRN: [id number]  HPI  Steve [name] is a 52 yr old male who presents today for follow up.  Was last seen on 12/18 by Malva Cogan PA-c and was treated with azithromycin for sinusitis.  Reports improvement in cough and improvement in sinus congestion.  Denies fever.     Review of Systems    see HPI  Past Medical History  Diagnosis Date  . Diabetes mellitus without complication   . Hypertension   . Hyperlipidemia   . Complication of anesthesia     has night terrors after anesthesia but can be violent when waking up  . 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 Pt stated "I forget alot of things"  . Frequent urination at night     when blood sugar runs high  . Psoriasis of scalp     nose and face; occurs mainly in winter   . Seasonal allergies   . CAD (coronary artery disease)   . Headache(784.0)     History   Social History  . Marital Status: Married    Spouse Name: N/A    Number of Children: 6  . Years of Education: N/A   Occupational History  . Not on file.   Social History Main Topics  . Smoking status: Never Smoker   . Smokeless tobacco: Never Used  . Alcohol Use: No  . Drug Use: No  . Sexual Activity: Yes    Partners: Female   Other Topics Concern  . Not on file   Social History Narrative   Regular exercise: very little   Caffeine use: sweet tea daily   6 children   Works in [employer] representative, owns concession.   Married   Enjoys televition.      Past Surgical History  Procedure Laterality Date  . Hernia repair  2010    abdominal hernia  . Knee arthroscopy  2012    left knee  . Cardiac catheterization      no PCI approx 10 years ago [facility] [place]  . Finger surgery      right ring finger  . Knee surgery      Family History  Problem Relation Age of Onset  . Diabetes Mother   . Cancer Mother     history breast cancer  . Hypertension Mother   . Cancer  Father     prostate  . Alzheimer's disease Father   . Diabetes Father   . Hypertension Father   . Cancer Maternal Uncle     colon  . Colon cancer Maternal Uncle 51  . Cancer Maternal Grandmother     breast  . Cancer Maternal Grandfather     prostate  . Cancer Maternal Uncle     prostate  . Colon cancer Maternal Uncle 58  . Cancer Maternal Uncle     prostate  . Cancer Cousin 47    metastatic colon cancer?  . Colon polyps Paternal Uncle   . Esophageal cancer Neg Hx     Allergies  Allergen Reactions  . Sulfur Itching    Current Outpatient Prescriptions on File Prior to Visit  Medication Sig Dispense Refill  . aspirin 81 MG tablet Take 81 mg by mouth daily.      . fexofenadine-pseudoephedrine (ALLEGRA-D 24) 180-240 MG per 24 hr tablet Take 1 tablet by mouth daily.      Marland Kitchen  fluticasone (FLONASE) 50 MCG/ACT nasal spray Place 2 sprays into both nostrils daily.  16 g  6  . insulin aspart (NOVOLOG) 100 UNIT/ML SOPN FlexPen sliding scale- check sugar and inject 3 times daily before meals as below:  <150-   Zero units 150-200 2 units 201-250 4 units 251-300 6 units 301-350 8 units 351-400 10 units >400             12 units and contact us.  3 pen  4  . insulin glargine (LANTUS) 100 UNIT/ML injection Inject 35 Units into the skin at bedtime. Sliding scale      . lisinopril (PRINIVIL,ZESTRIL) 2.5 MG tablet Take 2.5 mg by mouth daily.      . metFORMIN (GLUCOPHAGE) 1000 MG tablet Take 1,000 mg by mouth 2 (two) times daily with a meal.      . metoprolol succinate (TOPROL XL) 25 MG 24 hr tablet Take 0.5 tablets (12.5 mg total) by mouth daily.  45 tablet  3  . Multiple Vitamins-Minerals (ZINC PO) Take 2 tablets by mouth every morning.      . simvastatin (ZOCOR) 40 MG tablet Take 40 mg by mouth at bedtime.      . sitaGLIPtin (JANUVIA) 100 MG tablet Take 100 mg by mouth daily.       No current facility-administered medications on file prior to visit.    BP 132/86  Pulse 80  Temp(Src)  97.7 F (36.5 C) (Oral)  Resp 16  Ht 6' 4.8" (1.951 m)  Wt 262 lb (118.842 kg)  BMI 31.22 kg/m2  SpO2 99%    Objective:   Physical Exam  Constitutional: He is oriented to person, place, and time. He appears well-developed and well-nourished. No distress.  HENT:  Head: Normocephalic and atraumatic.  Right Ear: Tympanic membrane and ear canal normal.  Left Ear: Tympanic membrane and ear canal normal.  Mouth/Throat: No oropharyngeal exudate, posterior oropharyngeal edema or posterior oropharyngeal erythema.  Cardiovascular: Normal rate and regular rhythm.   No murmur heard. Pulmonary/Chest: Effort normal and breath sounds normal. No respiratory distress. He has no wheezes. He has no rales. He exhibits no tenderness.  Lymphadenopathy:    He has no cervical adenopathy.  Neurological: He is alert and oriented to person, place, and time.  Psychiatric: He has a normal mood and affect. His behavior is normal. Judgment and thought content normal.          Assessment & Plan:

## 2013-10-04 NOTE — Patient Instructions (Signed)
Call if symptoms do not continue to improve.  Follow up in 3 months.

## 2013-10-04 NOTE — Assessment & Plan Note (Signed)
Resolved.  OK from a medical standpoint to proceed with knee surgery.

## 2013-10-06 LAB — TYPE AND SCREEN
DAT, IgG: NEGATIVE
Donor AG Type: NEGATIVE
PT AG Type: NEGATIVE
Unit division: 0

## 2013-10-20 ENCOUNTER — Encounter: Payer: Self-pay | Admitting: Physician Assistant

## 2013-10-20 ENCOUNTER — Ambulatory Visit (INDEPENDENT_AMBULATORY_CARE_PROVIDER_SITE_OTHER): Payer: BC Managed Care – PPO | Admitting: Physician Assistant

## 2013-10-20 VITALS — BP 136/92 | HR 97 | Temp 97.5°F | Resp 18 | Ht 76.0 in | Wt 260.8 lb

## 2013-10-20 DIAGNOSIS — J329 Chronic sinusitis, unspecified: Secondary | ICD-10-CM

## 2013-10-20 DIAGNOSIS — J309 Allergic rhinitis, unspecified: Secondary | ICD-10-CM

## 2013-10-20 MED ORDER — METHYLPREDNISOLONE (PAK) 4 MG PO TABS: ORAL_TABLET | ORAL | Status: DC

## 2013-10-20 MED ORDER — AMOXICILLIN-POT CLAVULANATE 875-125 MG PO TABS: 1.0000 | ORAL_TABLET | Freq: Two times a day (BID) | ORAL | Status: DC

## 2013-10-20 MED ORDER — INSULIN GLARGINE 100 UNIT/ML ~~LOC~~ SOLN: 35.0000 [IU] | Freq: Every day | SUBCUTANEOUS | Status: DC

## 2013-10-20 MED ORDER — FLUTICASONE PROPIONATE 50 MCG/ACT NA SUSP: 2.0000 | Freq: Every day | NASAL | Status: DC

## 2013-10-20 NOTE — Progress Notes (Signed)
Pre visit review using our clinic review tool, if applicable. No additional management support is needed unless otherwise documented below in the visit note/SLS  

## 2013-10-20 NOTE — Assessment & Plan Note (Signed)
Second episode of sinusitis within 2 months. Patient previously treated with azithromycin. Will Rx Augmentin. Increase fluid intake. Rest. Saline nasal spray. Probiotic. Plain Mucinex. Please humidifier in bedroom. Also Rx Medrol Dosepak. Patient does have diagnosis of diabetes. Currently on sliding scale insulin. Discussed possible increase in blood glucose while on prednisone. Patient instructed to follow sliding scale for insulin dosing. Patient to call if blood sugar 350-400 or above.  If symptoms are not improving, we'll possibly need a CT of sinuses and referral to otolaryngology.

## 2013-10-20 NOTE — Progress Notes (Signed)
Patient ID: Steve [name], male   DOB: 02/01/1961, 53 y.o.   MRN: [id number]  Patient presents to clinic today complaining of recurrence of sinus pressure, sinus pain, cough, chest congestion and fatigue. Symptoms been present for one week. Patient recently treated for sinusitis with azithromycin. Patient states the symptoms completely dissipated, but has now recurred. Patient denies fever, chills or muscle aches. Denies recent travel or sick contact. Denies history of asthma. Denies shortness of breath. Denies chest pain. Patient with history of allergies, currently taking Allegra and Flonase daily.  Past Medical History  Diagnosis Date  . Diabetes mellitus without complication   . Hypertension   . Hyperlipidemia   . Complication of anesthesia     has night terrors after anesthesia but can be violent when waking up  . 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 Pt stated "I forget alot of things"  . Frequent urination at night     when blood sugar runs high  . Psoriasis of scalp     nose and face; occurs mainly in winter   . Seasonal allergies   . CAD (coronary artery disease)   . QTMAUQJF(354.5)     Current Outpatient Prescriptions on File Prior to Visit  Medication Sig Dispense Refill  . aspirin 81 MG tablet Take 81 mg by mouth daily.      . fexofenadine-pseudoephedrine (ALLEGRA-D 24) 180-240 MG per 24 hr tablet Take 1 tablet by mouth daily.      . insulin aspart (NOVOLOG) 100 UNIT/ML SOPN FlexPen sliding scale- check sugar and inject 3 times daily before meals as below:  <150-   Zero units 150-200 2 units 201-250 4 units 251-300 6 units 301-350 8 units 351-400 10 units >400             12 units and contact us.  3 pen  4  . lisinopril (PRINIVIL,ZESTRIL) 2.5 MG tablet Take 2.5 mg by mouth daily.      . metFORMIN (GLUCOPHAGE) 1000 MG tablet Take 1,000 mg by mouth 2 (two) times daily with a meal.      . Multiple Vitamins-Minerals (ZINC PO) Take 2 tablets by mouth every  morning.      . simvastatin (ZOCOR) 40 MG tablet Take 40 mg by mouth at bedtime.      . sitaGLIPtin (JANUVIA) 100 MG tablet Take 100 mg by mouth daily.      . metoprolol succinate (TOPROL XL) 25 MG 24 hr tablet Take 0.5 tablets (12.5 mg total) by mouth daily.  45 tablet  3   No current facility-administered medications on file prior to visit.    Allergies  Allergen Reactions  . Sulfur Itching    Family History  Problem Relation Age of Onset  . Diabetes Mother   . Cancer Mother     history breast cancer  . Hypertension Mother   . Cancer Father     prostate  . Alzheimer's disease Father   . Diabetes Father   . Hypertension Father   . Cancer Maternal Uncle     colon  . Colon cancer Maternal Uncle 6  . Cancer Maternal Grandmother     breast  . Cancer Maternal Grandfather     prostate  . Cancer Maternal Uncle     prostate  . Colon cancer Maternal Uncle 63  . Cancer Maternal Uncle     prostate  . Cancer Cousin 47    metastatic colon cancer?  . Colon  polyps Paternal Uncle   . Esophageal cancer Neg Hx     History   Social History  . Marital Status: Married    Spouse Name: N/A    Number of Children: 6  . Years of Education: N/A   Social History Main Topics  . Smoking status: Never Smoker   . Smokeless tobacco: Never Used  . Alcohol Use: No  . Drug Use: No  . Sexual Activity: Yes    Partners: Female   Other Topics Concern  . None   Social History Narrative   Regular exercise: very little   Caffeine use: sweet tea daily   6 children   Works in Architect, owns concession.   Married   Enjoys televition.     Review of Systems - See HPI.  All other ROS are negative.  Filed Vitals:   10/20/13 0928  BP: 136/92  Pulse: 97  Temp: 97.5 F (36.4 C)  Resp: 18   Physical Exam  Vitals reviewed. Constitutional: He is oriented to person, place, and time and well-developed, well-nourished, and in no distress.  HENT:  Head: Normocephalic and atraumatic.   Right Ear: External ear normal.  Left Ear: External ear normal.  Nose: Nose normal.  Mouth/Throat: Oropharynx is clear and moist. No oropharyngeal exudate.  Tympanic membranes within normal limits bilaterally. Tenderness to percussion of sinuses noted on examination.  Eyes: Conjunctivae are normal. Pupils are equal, round, and reactive to light.  Neck: Neck supple.  Cardiovascular: Normal rate, regular rhythm, normal heart sounds and intact distal pulses.   Pulmonary/Chest: Effort normal and breath sounds normal. No respiratory distress. He has no wheezes. He has no rales. He exhibits no tenderness.  Lymphadenopathy:    He has no cervical adenopathy.  Neurological: He is alert and oriented to person, place, and time.  Skin: Skin is warm and dry. No rash noted.  Psychiatric: Affect normal.    Recent Results (from the past 2160 hour(s))  CBC WITH DIFFERENTIAL     Status: None   Collection Time    08/05/13  3:24 PM      Result Value Range   WBC 9.6  4.0 - 10.5 K/uL   RBC 5.40  4.22 - 5.81 MIL/uL   Hemoglobin 15.2  13.0 - 17.0 g/dL   HCT 44.3  39.0 - 52.0 %   MCV 82.0  78.0 - 100.0 fL   MCH 28.1  26.0 - 34.0 pg   MCHC 34.3  30.0 - 36.0 g/dL   RDW 13.5  11.5 - 15.5 %   Platelets 326  150 - 400 K/uL   Neutrophils Relative % 63  43 - 77 %   Neutro Abs 6.1  1.7 - 7.7 K/uL   Lymphocytes Relative 28  12 - 46 %   Lymphs Abs 2.7  0.7 - 4.0 K/uL   Monocytes Relative 7  3 - 12 %   Monocytes Absolute 0.7  0.1 - 1.0 K/uL   Eosinophils Relative 1  0 - 5 %   Eosinophils Absolute 0.1  0.0 - 0.7 K/uL   Basophils Relative 0  0 - 1 %   Basophils Absolute 0.0  0.0 - 0.1 K/uL  D-DIMER, QUANTITATIVE     Status: None   Collection Time    08/05/13  3:24 PM      Result Value Range   D-Dimer, Quant <0.27  0.00 - 0.48 ug/mL-FEU   Comment:            AT  THE INHOUSE ESTABLISHED CUTOFF     VALUE OF 0.48 ug/mL FEU,     THIS ASSAY HAS BEEN DOCUMENTED     IN THE LITERATURE TO HAVE     A SENSITIVITY  AND NEGATIVE     PREDICTIVE VALUE OF AT LEAST     98 TO 99%.  THE TEST RESULT     SHOULD BE CORRELATED WITH     AN ASSESSMENT OF THE CLINICAL     PROBABILITY OF DVT / VTE.  BASIC METABOLIC PANEL     Status: Abnormal   Collection Time    08/05/13  3:54 PM      Result Value Range   Sodium 142  135 - 145 mEq/L   Potassium 4.0  3.5 - 5.1 mEq/L   Chloride 102  96 - 112 mEq/L   CO2 29  19 - 32 mEq/L   Glucose, Bld 105 (*) 70 - 99 mg/dL   BUN 9  6 - 23 mg/dL   Creatinine, Ser 0.90  0.50 - 1.35 mg/dL   Calcium 10.2  8.4 - 10.5 mg/dL   GFR calc non Af Amer >90  >90 mL/min   GFR calc Af Amer >90  >90 mL/min   Comment: (NOTE)     The eGFR has been calculated using the CKD EPI equation.     This calculation has not been validated in all clinical situations.     eGFR's persistently <90 mL/min signify possible Chronic Kidney     Disease.  TROPONIN I     Status: None   Collection Time    08/05/13  3:54 PM      Result Value Range   Troponin I <0.30  <0.30 ng/mL   Comment:            Due to the release kinetics of cTnI,     a negative result within the first hours     of the onset of symptoms does not rule out     myocardial infarction with certainty.     If myocardial infarction is still suspected,     repeat the test at appropriate intervals.  HEMOGLOBIN A1C     Status: Abnormal   Collection Time    09/19/13 12:22 PM      Result Value Range   Hemoglobin A1C 8.9 (*) <5.7 %   Comment:                                                                            According to the ADA Clinical Practice Recommendations for 2011, when     HbA1c is used as a screening test:             >=6.5%   Diagnostic of Diabetes Mellitus                (if abnormal result is confirmed)           5.7-6.4%   Increased risk of developing Diabetes Mellitus           References:Diagnosis and Classification of Diabetes Mellitus,Diabetes     YHCW,2376,28(BTDVV 1):S62-S69 and Standards of Medical Care in  Diabetes - 2011,Diabetes Care,2011,34 (Suppl 1):S11-S61.         Mean Plasma Glucose 209 (*) <117 mg/dL  SURGICAL PCR SCREEN     Status: None   Collection Time    09/21/13 11:43 AM      Result Value Range   MRSA, PCR NEGATIVE  NEGATIVE   Staphylococcus aureus NEGATIVE  NEGATIVE   Comment:            The Xpert SA Assay (FDA     approved for NASAL specimens     in patients over 54 years of age),     is one component of     a comprehensive surveillance     program.  Test performance has     been validated by [employer] for patients greater     than or equal to 47 year old.     It is not intended     to diagnose infection nor to     guide or monitor treatment.  URINALYSIS, ROUTINE W REFLEX MICROSCOPIC     Status: None   Collection Time    09/21/13 11:44 AM      Result Value Range   Color, Urine YELLOW  YELLOW   APPearance CLEAR  CLEAR   Specific Gravity, Urine 1.016  1.005 - 1.030   pH 7.0  5.0 - 8.0   Glucose, UA NEGATIVE  NEGATIVE mg/dL   Hgb urine dipstick NEGATIVE  NEGATIVE   Bilirubin Urine NEGATIVE  NEGATIVE   Ketones, ur NEGATIVE  NEGATIVE mg/dL   Protein, ur NEGATIVE  NEGATIVE mg/dL   Urobilinogen, UA 0.2  0.0 - 1.0 mg/dL   Nitrite NEGATIVE  NEGATIVE   Leukocytes, UA NEGATIVE  NEGATIVE   Comment: MICROSCOPIC NOT DONE ON URINES WITH NEGATIVE PROTEIN, BLOOD, LEUKOCYTES, NITRITE, OR GLUCOSE <1000 mg/dL.  URINE CULTURE     Status: None   Collection Time    09/21/13 11:44 AM      Result Value Range   Specimen Description URINE, CLEAN CATCH     Special Requests NONE     Culture  Setup Time       Value: 09/21/2013 11:44     Performed at [facility]       Value: NO GROWTH     Performed at [facility]   Culture       Value: NO GROWTH     Performed at [facility]   Report Status 09/22/2013 FINAL    APTT     Status: None   Collection Time    09/21/13 11:45 AM      Result Value Range   aPTT 30  24 - 37 seconds   CBC WITH DIFFERENTIAL     Status: None   Collection Time    09/21/13 11:45 AM      Result Value Range   WBC 5.3  4.0 - 10.5 K/uL   RBC 5.19  4.22 - 5.81 MIL/uL   Hemoglobin 14.3  13.0 - 17.0 g/dL   HCT 42.8  39.0 - 52.0 %   MCV 82.5  78.0 - 100.0 fL   MCH 27.6  26.0 - 34.0 pg   MCHC 33.4  30.0 - 36.0 g/dL   RDW 13.1  11.5 - 15.5 %   Platelets 292  150 - 400 K/uL   Neutrophils Relative % 59  43 - 77 %   Neutro Abs 3.1  1.7 -  7.7 K/uL   Lymphocytes Relative 31  12 - 46 %   Lymphs Abs 1.6  0.7 - 4.0 K/uL   Monocytes Relative 7  3 - 12 %   Monocytes Absolute 0.4  0.1 - 1.0 K/uL   Eosinophils Relative 2  0 - 5 %   Eosinophils Absolute 0.1  0.0 - 0.7 K/uL   Basophils Relative 0  0 - 1 %   Basophils Absolute 0.0  0.0 - 0.1 K/uL  COMPREHENSIVE METABOLIC PANEL     Status: Abnormal   Collection Time    09/21/13 11:45 AM      Result Value Range   Sodium 139  135 - 145 mEq/L   Potassium 4.3  3.5 - 5.1 mEq/L   Chloride 100  96 - 112 mEq/L   CO2 27  19 - 32 mEq/L   Glucose, Bld 130 (*) 70 - 99 mg/dL   BUN 9  6 - 23 mg/dL   Creatinine, Ser 0.79  0.50 - 1.35 mg/dL   Calcium 9.3  8.4 - 10.5 mg/dL   Total Protein 7.2  6.0 - 8.3 g/dL   Albumin 4.0  3.5 - 5.2 g/dL   AST 21  0 - 37 U/L   ALT 28  0 - 53 U/L   Alkaline Phosphatase 60  39 - 117 U/L   Total Bilirubin 0.5  0.3 - 1.2 mg/dL   GFR calc non Af Amer >90  >90 mL/min   GFR calc Af Amer >90  >90 mL/min   Comment: (NOTE)     The eGFR has been calculated using the CKD EPI equation.     This calculation has not been validated in all clinical situations.     eGFR's persistently <90 mL/min signify possible Chronic Kidney     Disease.  PROTIME-INR     Status: None   Collection Time    09/21/13 11:45 AM      Result Value Range   Prothrombin Time 12.7  11.6 - 15.2 seconds   INR 0.97  0.00 - 1.49  TYPE AND SCREEN     Status: None   Collection Time    09/21/13 11:55 AM      Result Value Range   ABO/RH(D) O NEG     Antibody Screen  POS     Sample Expiration 10/05/2013     Antibody Identification ANTI-M     PT AG Type NEGATIVE FOR M ANTIGEN     DAT, IgG NEG     Unit Number [id number]     Blood Component Type RED CELLS,LR     Unit division 00     Status of Unit REL FROM [facility]     Donor AG Type NEGATIVE FOR M ANTIGEN     Transfusion Status OK TO TRANSFUSE     Crossmatch Result COMPATIBLE     Unit Number [id number]     Blood Component Type RED CELLS,LR     Unit division 00     Status of Unit REL FROM [facility]     Donor AG Type NEGATIVE FOR M ANTIGEN     Transfusion Status OK TO TRANSFUSE     Crossmatch Result COMPATIBLE      Assessment/Plan: No problem-specific assessment & plan notes found for this encounter.

## 2013-10-20 NOTE — Patient Instructions (Signed)
Please take antibiotic as prescribed.  Take steroid pack as prescribed.  Monitor blood sugars -- follow sliding scale for insulin based on blood sugars.  If blood sugar > 400 take insulin as directed.  If it persists stop taking steroids and call the clinic.  Increase fluid intake.  Rest.  Saline nasal spray.  Humidifier in bedroom.  Continue Allegra and Flonase.  If symptoms not improving in 48 hours, we will need to obtain imaging and send you to ENT.

## 2013-10-24 ENCOUNTER — Telehealth: Payer: Self-pay | Admitting: Family

## 2013-10-24 NOTE — Telephone Encounter (Signed)
Please mail lab results

## 2013-10-24 NOTE — Telephone Encounter (Signed)
Mailed result from 09/19/13.

## 2013-11-07 ENCOUNTER — Telehealth: Payer: Self-pay | Admitting: Family

## 2013-11-07 MED ORDER — METFORMIN HCL 1000 MG PO TABS: 1000.0000 mg | ORAL_TABLET | Freq: Two times a day (BID) | ORAL | Status: DC

## 2013-11-07 NOTE — Telephone Encounter (Signed)
Wife requests refill on metformin.

## 2013-11-07 NOTE — Telephone Encounter (Signed)
Opened in error

## 2013-12-05 ENCOUNTER — Telehealth: Payer: Self-pay | Admitting: Family

## 2013-12-05 DIAGNOSIS — J322 Chronic ethmoidal sinusitis: Secondary | ICD-10-CM

## 2013-12-05 NOTE — Telephone Encounter (Signed)
Patient wife called in stating that patient wants to be referred to an ENT. He has seen Einar Pheasant a couple of times regarding a sinus infection and his wife states that he is still having problems with that.

## 2013-12-06 ENCOUNTER — Ambulatory Visit (INDEPENDENT_AMBULATORY_CARE_PROVIDER_SITE_OTHER): Payer: BC Managed Care – PPO | Admitting: Family

## 2013-12-06 ENCOUNTER — Encounter: Payer: Self-pay | Admitting: Family

## 2013-12-06 VITALS — BP 138/88 | HR 86 | Temp 98.0°F | Resp 18 | Ht 76.0 in | Wt 262.0 lb

## 2013-12-06 DIAGNOSIS — J101 Influenza due to other identified influenza virus with other respiratory manifestations: Secondary | ICD-10-CM

## 2013-12-06 DIAGNOSIS — R05 Cough: Secondary | ICD-10-CM

## 2013-12-06 DIAGNOSIS — J111 Influenza due to unidentified influenza virus with other respiratory manifestations: Secondary | ICD-10-CM

## 2013-12-06 DIAGNOSIS — R059 Cough, unspecified: Secondary | ICD-10-CM

## 2013-12-06 DIAGNOSIS — M109 Gout, unspecified: Secondary | ICD-10-CM

## 2013-12-06 LAB — POCT INFLUENZA A/B
INFLUENZA A, POC: NEGATIVE
Influenza B, POC: POSITIVE

## 2013-12-06 MED ORDER — BENZONATATE 100 MG PO CAPS: 100.0000 mg | ORAL_CAPSULE | Freq: Three times a day (TID) | ORAL | Status: DC | PRN

## 2013-12-06 MED ORDER — OSELTAMIVIR PHOSPHATE 75 MG PO CAPS: 75.0000 mg | ORAL_CAPSULE | Freq: Two times a day (BID) | ORAL | Status: DC

## 2013-12-06 MED ORDER — SIMVASTATIN 40 MG PO TABS: 40.0000 mg | ORAL_TABLET | Freq: Every day | ORAL | Status: DC

## 2013-12-06 MED ORDER — COLCHICINE 0.6 MG PO TABS: ORAL_TABLET | ORAL | Status: DC

## 2013-12-06 NOTE — Assessment & Plan Note (Signed)
Rapid flu + for influenza B.  He is inside the 48 hour mark. Advised pt to start tamiflu. tessalon as needed for cough and tylenol as needed for fever, muscle pain, comfort. Pt instructed to call if worsening cough, fever >101, or if not improved in 3 days.

## 2013-12-06 NOTE — Patient Instructions (Addendum)
Please start tamiflu for flu. You may use tessalon as needed for cough and tylenol as needed for fever, muscle pain, comfort. Start colcrys for ankle pain/gout. Call if you develop worsening cough, fever >101, or if not improved in 3 days.

## 2013-12-06 NOTE — Assessment & Plan Note (Signed)
Trial of colcrys.

## 2013-12-06 NOTE — Progress Notes (Signed)
Pre visit review using our clinic review tool, if applicable. No additional management support is needed unless otherwise documented below in the visit note. 

## 2013-12-06 NOTE — Progress Notes (Signed)
Subjective:    Patient ID: Steve [name], male    DOB: Jul 06, 1961, 53 y.o.   MRN: [id number]  HPI  Steve [name] is a 53 yr old male who presents today with chief complaint of cough.  He reports that symptoms started Sunday afternoon around 2 pm.  Reports associated myalgia and sore throat.    He also reports + left ankle pain/tenderness. Has had similar symptoms in the past which he reports responded to gout rx.    Review of Systems See HPI  Past Medical History  Diagnosis Date  . Diabetes mellitus without complication   . Hypertension   . Hyperlipidemia   . Complication of anesthesia     has night terrors after anesthesia but can be violent when waking up  . 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 Pt stated "I forget alot of things"  . Frequent urination at night     when blood sugar runs high  . Psoriasis of scalp     nose and face; occurs mainly in winter   . Seasonal allergies   . CAD (coronary artery disease)   . Headache(784.0)     History   Social History  . Marital Status: Married    Spouse Name: N/A    Number of Children: 6  . Years of Education: N/A   Occupational History  . Not on file.   Social History Main Topics  . Smoking status: Never Smoker   . Smokeless tobacco: Never Used  . Alcohol Use: No  . Drug Use: No  . Sexual Activity: Yes    Partners: Female   Other Topics Concern  . Not on file   Social History Narrative   Regular exercise: very little   Caffeine use: sweet tea daily   6 children   Works in Architect, owns concession.   Married   Enjoys televition.      Past Surgical History  Procedure Laterality Date  . Hernia repair  2010    abdominal hernia  . Knee arthroscopy  2012    left knee  . Cardiac catheterization      no PCI approx 10 years ago - [place] surgery      right ring finger  . Knee surgery      Family History  Problem Relation Age of Onset  . Diabetes Mother   . Cancer  Mother     history breast cancer  . Hypertension Mother   . Cancer Father     prostate  . Alzheimer's disease Father   . Diabetes Father   . Hypertension Father   . Cancer Maternal Uncle     colon  . Colon cancer Maternal Uncle 36  . Cancer Maternal Grandmother     breast  . Cancer Maternal Grandfather     prostate  . Cancer Maternal Uncle     prostate  . Colon cancer Maternal Uncle 39  . Cancer Maternal Uncle     prostate  . Cancer Cousin 47    metastatic colon cancer?  . Colon polyps Paternal Uncle   . Esophageal cancer Neg Hx     Allergies  Allergen Reactions  . Sulfur Itching    Current Outpatient Prescriptions on File Prior to Visit  Medication Sig Dispense Refill  . aspirin 81 MG tablet Take 81 mg by mouth daily.      . fexofenadine-pseudoephedrine (ALLEGRA-D 24) 180-240 MG per 24 hr  tablet Take 1 tablet by mouth daily.      . fluticasone (FLONASE) 50 MCG/ACT nasal spray Place 2 sprays into both nostrils daily.  16 g  6  . insulin aspart (NOVOLOG) 100 UNIT/ML SOPN FlexPen sliding scale- check sugar and inject 3 times daily before meals as below:  <150-   Zero units 150-200 2 units 201-250 4 units 251-300 6 units 301-350 8 units 351-400 10 units >400             12 units and contact us.  3 pen  4  . insulin glargine (LANTUS) 100 UNIT/ML injection Inject 0.35 mLs (35 Units total) into the skin at bedtime. Sliding scale  10 mL  3  . lisinopril (PRINIVIL,ZESTRIL) 2.5 MG tablet Take 2.5 mg by mouth daily.      . metFORMIN (GLUCOPHAGE) 1000 MG tablet Take 1 tablet (1,000 mg total) by mouth 2 (two) times daily with a meal.  60 tablet  2  . metoprolol succinate (TOPROL XL) 25 MG 24 hr tablet Take 0.5 tablets (12.5 mg total) by mouth daily.  45 tablet  3  . Multiple Vitamins-Minerals (ZINC PO) Take 2 tablets by mouth every morning.      . sitaGLIPtin (JANUVIA) 100 MG tablet Take 100 mg by mouth daily.       No current facility-administered medications on file prior to  visit.    BP 138/88  Pulse 86  Temp(Src) 98 F (36.7 C) (Oral)  Resp 18  Ht 6\' 4"  (1.93 m)  Wt 262 lb (118.842 kg)  BMI 31.90 kg/m2  SpO2 97%       Objective:   Physical Exam  Constitutional: He is oriented to person, place, and time. He appears well-developed and well-nourished. No distress.  HENT:  Head: Normocephalic and atraumatic.  Cardiovascular: Normal rate and regular rhythm.   No murmur heard. Pulmonary/Chest: Effort normal and breath sounds normal. No respiratory distress. He has no wheezes. He has no rales. He exhibits no tenderness.  Musculoskeletal: He exhibits no edema.  L ankle- + tenderness without swelling or erythema.   Neurological: He is alert and oriented to person, place, and time.  Psychiatric: He has a normal mood and affect. His behavior is normal. Judgment and thought content normal.          Assessment & Plan:

## 2013-12-09 ENCOUNTER — Encounter (HOSPITAL_COMMUNITY): Payer: Self-pay | Admitting: Emergency Medicine

## 2013-12-09 ENCOUNTER — Emergency Department (HOSPITAL_COMMUNITY)
Admission: EM | Admit: 2013-12-09 | Discharge: 2013-12-09 | Disposition: A | Discharge status: Home / Self Care | Payer: BC Managed Care – PPO | Attending: Emergency Medicine | Admitting: Emergency Medicine

## 2013-12-09 ENCOUNTER — Emergency Department (HOSPITAL_COMMUNITY): Payer: BC Managed Care – PPO

## 2013-12-09 DIAGNOSIS — E785 Hyperlipidemia, unspecified: Secondary | ICD-10-CM | POA: Insufficient documentation

## 2013-12-09 DIAGNOSIS — I2510 Atherosclerotic heart disease of native coronary artery without angina pectoris: Secondary | ICD-10-CM | POA: Insufficient documentation

## 2013-12-09 DIAGNOSIS — J101 Influenza due to other identified influenza virus with other respiratory manifestations: Secondary | ICD-10-CM

## 2013-12-09 DIAGNOSIS — I10 Essential (primary) hypertension: Secondary | ICD-10-CM | POA: Insufficient documentation

## 2013-12-09 DIAGNOSIS — Z7982 Long term (current) use of aspirin: Secondary | ICD-10-CM | POA: Insufficient documentation

## 2013-12-09 DIAGNOSIS — J111 Influenza due to unidentified influenza virus with other respiratory manifestations: Secondary | ICD-10-CM | POA: Insufficient documentation

## 2013-12-09 DIAGNOSIS — Z9889 Other specified postprocedural states: Secondary | ICD-10-CM | POA: Insufficient documentation

## 2013-12-09 DIAGNOSIS — Z872 Personal history of diseases of the skin and subcutaneous tissue: Secondary | ICD-10-CM | POA: Insufficient documentation

## 2013-12-09 DIAGNOSIS — Z794 Long term (current) use of insulin: Secondary | ICD-10-CM | POA: Insufficient documentation

## 2013-12-09 DIAGNOSIS — Z79899 Other long term (current) drug therapy: Secondary | ICD-10-CM | POA: Insufficient documentation

## 2013-12-09 DIAGNOSIS — IMO0002 Reserved for concepts with insufficient information to code with codable children: Secondary | ICD-10-CM | POA: Insufficient documentation

## 2013-12-09 DIAGNOSIS — E119 Type 2 diabetes mellitus without complications: Secondary | ICD-10-CM | POA: Insufficient documentation

## 2013-12-09 LAB — CBC WITH DIFFERENTIAL/PLATELET
Basophils Absolute: 0 10*3/uL (ref 0.0–0.1)
Basophils Relative: 0 % (ref 0–1)
Eosinophils Absolute: 0.2 10*3/uL (ref 0.0–0.7)
Eosinophils Relative: 5 % (ref 0–5)
HEMATOCRIT: 43.8 % (ref 39.0–52.0)
Hemoglobin: 15.1 g/dL (ref 13.0–17.0)
Lymphocytes Relative: 33 % (ref 12–46)
Lymphs Abs: 1.7 10*3/uL (ref 0.7–4.0)
MCH: 28.7 pg (ref 26.0–34.0)
MCHC: 34.5 g/dL (ref 30.0–36.0)
MCV: 83.3 fL (ref 78.0–100.0)
MONOS PCT: 8 % (ref 3–12)
Monocytes Absolute: 0.4 10*3/uL (ref 0.1–1.0)
NEUTROS ABS: 2.9 10*3/uL (ref 1.7–7.7)
NEUTROS PCT: 55 % (ref 43–77)
Platelets: 276 10*3/uL (ref 150–400)
RBC: 5.26 MIL/uL (ref 4.22–5.81)
RDW: 12.9 % (ref 11.5–15.5)
WBC: 5.3 10*3/uL (ref 4.0–10.5)

## 2013-12-09 LAB — POCT I-STAT, CHEM 8
BUN: 13 mg/dL (ref 6–23)
CALCIUM ION: 1.14 mmol/L (ref 1.12–1.23)
Chloride: 101 mEq/L (ref 96–112)
Creatinine, Ser: 0.9 mg/dL (ref 0.50–1.35)
GLUCOSE: 144 mg/dL — AB (ref 70–99)
HCT: 47 % (ref 39.0–52.0)
Hemoglobin: 16 g/dL (ref 13.0–17.0)
Potassium: 5 mEq/L (ref 3.7–5.3)
Sodium: 139 mEq/L (ref 137–147)
TCO2: 28 mmol/L (ref 0–100)

## 2013-12-09 MED ORDER — SODIUM CHLORIDE 0.9 % IV SOLN: 1000.0000 mL | INTRAVENOUS | Status: DC

## 2013-12-09 MED ORDER — SODIUM CHLORIDE 0.9 % IV SOLN
1000.0000 mL | Freq: Once | INTRAVENOUS | Status: AC
  Administered 2013-12-09: 1000 mL via INTRAVENOUS

## 2013-12-09 MED ORDER — HYDROCODONE-ACETAMINOPHEN 7.5-325 MG/15ML PO SOLN
10.0000 mL | Freq: Once | ORAL | Status: AC
  Administered 2013-12-09: 10 mL via ORAL
  Filled 2013-12-09: qty 15

## 2013-12-09 MED ORDER — HYDROCODONE-ACETAMINOPHEN 7.5-325 MG/15ML PO SOLN: 10.0000 mL | Freq: Four times a day (QID) | ORAL | Status: DC | PRN

## 2013-12-09 NOTE — ED Notes (Addendum)
Pt was seen a few days ago at [facility] and dx with flu and sts he is not getting better. sts productive cough. sts HA for 3 days.

## 2013-12-09 NOTE — ED Notes (Signed)
Patient transported to X-ray 

## 2013-12-09 NOTE — ED Provider Notes (Signed)
CSN: [id number]     Arrival date & time 12/09/13  1021 History   First MD Initiated Contact with Patient 12/09/13 1129     Chief Complaint  Patient presents with  . Cough     (Consider location/radiation/quality/duration/timing/severity/associated sxs/prior Treatment) HPI  53 year old male with history of diabetes, hypertension, HLD here for further management of his flu. Patient states he has been having fever, chills, headache, nasal congestion, sneezing, cough, sore throat, body aches, decreased appetite, nausea vomiting diarrhea ongoing for the past 5 days. He was seen in his PCP office 3 days ago for this complaint and had a positive influenza test. He was prescribed Tamiflu and cough medication which he has been taking however it provides no relief. He feels run down, tired, complaining of pleuritic chest pain from persistent cough. States vomiting and diarrhea has resolved but has no appetite. He is here requesting for further management.  Past Medical History  Diagnosis Date  . Diabetes mellitus without complication   . Hypertension   . Hyperlipidemia   . Complication of anesthesia     has night terrors after anesthesia but can be violent when waking up  . 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 Pt stated "I forget alot of things"  . Frequent urination at night     when blood sugar runs high  . Psoriasis of scalp     nose and face; occurs mainly in winter   . Seasonal allergies   . CAD (coronary artery disease)   . LSLHTDSK(876.8)    Past Surgical History  Procedure Laterality Date  . Hernia repair  2010    abdominal hernia  . Knee arthroscopy  2012    left knee  . Cardiac catheterization      no PCI approx 10 years ago - [place] [facility]      right ring finger  . Knee surgery     Family History  Problem Relation Age of Onset  . Diabetes Mother   . Cancer Mother     history breast cancer  . Hypertension Mother   . Cancer Father     prostate  . Alzheimer's disease Father   . Diabetes Father   . Hypertension Father   . Cancer Maternal Uncle     colon  . Colon cancer Maternal Uncle 73  . Cancer Maternal Grandmother     breast  . Cancer Maternal Grandfather     prostate  . Cancer Maternal Uncle     prostate  . Colon cancer Maternal Uncle 63  . Cancer Maternal Uncle     prostate  . Cancer Cousin 47    metastatic colon cancer?  . Colon polyps Paternal Uncle   . Esophageal cancer Neg Hx    History  Substance Use Topics  . Smoking status: Never Smoker   . Smokeless tobacco: Never Used  . Alcohol Use: No    Review of Systems  All other systems reviewed and are negative.      Allergies  Sulfur  Home Medications   Current Outpatient Rx  Name  Route  Sig  Dispense  Refill  . aspirin 81 MG tablet   Oral   Take 81 mg by mouth daily.         . benzonatate (TESSALON) 100 MG capsule   Oral   Take 1 capsule (100 mg total) by mouth 3 (three) times daily as needed for cough.  20 capsule   0   . colchicine 0.6 MG tablet      2 tabs by mouth now, followed by 1 tablet 1 hour later. May repeat tomorrow if symptoms not improved.   6 tablet   0   . fexofenadine-pseudoephedrine (ALLEGRA-D 24) 180-240 MG per 24 hr tablet   Oral   Take 1 tablet by mouth daily.         . fluticasone (FLONASE) 50 MCG/ACT nasal spray   Each Nare   Place 2 sprays into both nostrils daily.   16 g   6   . insulin aspart (NOVOLOG) 100 UNIT/ML SOPN FlexPen      sliding scale- check sugar and inject 3 times daily before meals as below:  <150-   Zero units 150-200 2 units 201-250 4 units 251-300 6 units 301-350 8 units 351-400 10 units >400             12 units and contact us.   3 pen   4   . insulin glargine (LANTUS) 100 UNIT/ML injection   Subcutaneous   Inject 0.35 mLs (35 Units total) into the skin at bedtime. Sliding scale   10 mL   3   . lisinopril (PRINIVIL,ZESTRIL) 2.5 MG tablet   Oral   Take 2.5  mg by mouth daily.         . metFORMIN (GLUCOPHAGE) 1000 MG tablet   Oral   Take 1 tablet (1,000 mg total) by mouth 2 (two) times daily with a meal.   60 tablet   2   . metoprolol succinate (TOPROL XL) 25 MG 24 hr tablet   Oral   Take 0.5 tablets (12.5 mg total) by mouth daily.   45 tablet   3   . Multiple Vitamins-Minerals (ZINC PO)   Oral   Take 2 tablets by mouth every morning.         Marland Kitchen oseltamivir (TAMIFLU) 75 MG capsule   Oral   Take 1 capsule (75 mg total) by mouth 2 (two) times daily.   10 capsule   0   . simvastatin (ZOCOR) 40 MG tablet   Oral   Take 1 tablet (40 mg total) by mouth at bedtime.   30 tablet   3   . sitaGLIPtin (JANUVIA) 100 MG tablet   Oral   Take 100 mg by mouth daily.          BP 128/79  Pulse 88  Temp(Src) 98 F (36.7 C)  Resp 18  SpO2 95% Physical Exam  Constitutional: He appears well-developed and well-nourished. No distress (uncomfortable appearing but in no acute distress).  HENT:  Head: Atraumatic.  Right Ear: External ear normal.  Left Ear: External ear normal.  Nose: Nose normal.  Mouth/Throat: Oropharynx is clear and moist. No oropharyngeal exudate.  Eyes: Conjunctivae are normal.  Neck: Normal range of motion. Neck supple.  Cardiovascular: Normal rate and regular rhythm.   Pulmonary/Chest: Effort normal and breath sounds normal. He has no wheezes. He has no rales.  Abdominal: Soft. There is no tenderness.  Lymphadenopathy:    He has no cervical adenopathy.  Neurological: He is alert.  Skin: No rash noted.  Psychiatric: He has a normal mood and affect.    ED Course  Procedures (including critical care time)  12:27 PM Patient in with flu infection, minimal improvement with Tamiflu. Chest x-ray shows no acute infiltrate concerning for pneumonia. He is afebrile with stable normal vital sign, no hypoxia with  ambulation.  3:19 PM Labs are reassuring.  Pt does not meet admission criteria.  Stable for discharge with  close f/u instruciton.  Cough medication prescribed.    Labs Review Labs Reviewed - No data to display Imaging Review Dg Chest 2 View  12/09/2013   CLINICAL DATA:  53 year old male flu symptoms, cough, fever. Symptoms increasing. Initial encounter.  EXAM: CHEST  2 VIEW  COMPARISON:  08/05/2013.  FINDINGS: Stable lung volumes with mild elevation of the right hemidiaphragm. Normal cardiac size and mediastinal contours. Visualized tracheal air column is within normal limits. No pneumothorax, pulmonary edema, pleural effusion or consolidation. No confluent or acute pulmonary opacity identified. No acute osseous abnormality identified.  IMPRESSION: No acute cardiopulmonary abnormality.   Electronically Signed   By: Lars Pinks M.D.   On: 12/09/2013 11:03    EKG Interpretation  None  MDM   Final diagnoses:  Influenza B    BP 146/81  Pulse 81  Temp(Src) 98 F (36.7 C)  Resp 18  SpO2 96%  I have reviewed nursing notes and vital signs. I personally reviewed the imaging tests through PACS system  I reviewed available ER/hospitalization records thought the EMR    Domenic Moras, PA-C 12/09/13 1520

## 2013-12-09 NOTE — Discharge Instructions (Signed)

## 2013-12-09 NOTE — ED Notes (Addendum)
Pt was able to ambulate well while monitoring pulse oximetry. O2: 95% HR: 102

## 2013-12-12 ENCOUNTER — Encounter: Payer: Self-pay | Admitting: Gastroenterology

## 2013-12-12 NOTE — ED Provider Notes (Signed)
Medical screening examination/treatment/procedure(s) were performed by non-physician practitioner and as supervising physician I was immediately available for consultation/collaboration.   EKG Interpretation None        Naziah Weckerly W. Omayra Tulloch, MD 12/12/13 1011 

## 2013-12-14 ENCOUNTER — Other Ambulatory Visit: Payer: Self-pay | Admitting: Family

## 2013-12-14 NOTE — Telephone Encounter (Signed)
Please advise:  Medication name:  Name from pharmacy:  benzonatate (TESSALON) 100 MG capsule  BENZONATATE 100 MG CAPSULE 100 MG CAP Sig: TAKE 1 CAPSULE (100 MG TOTAL) BY MOUTH 3 (THREE) TIMES DAILY AS NEEDED FOR COUGH. Dispense: 20 capsule Refills: 0 Start: 12/14/2013 Class: Normal Requested on: 12/06/2013 Originally ordered on: 12/06/2013 Last refill: 12/06/2013

## 2013-12-15 ENCOUNTER — Encounter (HOSPITAL_COMMUNITY): Payer: Self-pay | Admitting: Pharmacy Technician

## 2013-12-16 ENCOUNTER — Other Ambulatory Visit: Payer: Self-pay | Admitting: Physician Assistant

## 2013-12-16 DIAGNOSIS — J329 Chronic sinusitis, unspecified: Secondary | ICD-10-CM

## 2013-12-16 HISTORY — DX: Chronic sinusitis, unspecified: J32.9

## 2013-12-16 NOTE — H&P (Signed)
TOTAL KNEE ADMISSION H&P  Patient is being admitted for left total knee arthroplasty.  Subjective:  Chief Complaint:left knee pain.  HPI: Steve [name], 53 y.o. male, has a history of pain and functional disability in the left knee due to arthritis and has failed non-surgical conservative treatments for greater than 12 weeks to includeNSAID's and/or analgesics, corticosteriod injections, viscosupplementation injections and activity modification.  Onset of symptoms was gradual, starting 10 years ago with gradually worsening course since that time. The patient noted prior procedures on the knee to include  arthroscopy on the left knee(s).  Patient currently rates pain in the left knee(s) at 5 out of 10 with activity. Patient has night pain, worsening of pain with activity and weight bearing, pain that interferes with activities of daily living and joint swelling.  Patient has evidence of subchondral sclerosis and joint space narrowing by imaging studies. There is no active infection.  Patient Active Problem List   Diagnosis Date Noted  . Influenza B 12/06/2013  . Gout 12/06/2013  . Allergic rhinitis 09/07/2013  . Tachycardia 08/05/2013  . Atypical chest pain 07/15/2013  . Left ankle pain 06/14/2013  . Rib injury 06/14/2013  . Pain in left ankle w/ effusion 05/24/2013  . Routine general medical examination at a health care facility 04/11/2013  . Type II or unspecified type diabetes mellitus without mention of complication, not stated as uncontrolled 07/26/2012  . Hyperlipidemia 07/26/2012  . 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 ED (erectile dysfunction) 07/26/2012  . Hypogonadism male 07/26/2012   Past Medical History  Diagnosis Date  . Diabetes mellitus without complication   . Hypertension   . Hyperlipidemia   . Complication of anesthesia     has night terrors after anesthesia but can be violent when waking up  . Memory loss of unknown cause     short and long  term. Pt stated "I forget alot of things"  . Frequent urination at night     when blood sugar runs high  . Psoriasis of scalp     nose and face; occurs mainly in winter   . Seasonal allergies   . CAD (coronary artery disease)   . Headache(784.0)     Past Surgical History  Procedure Laterality Date  . Hernia repair  2010    abdominal hernia  . Knee arthroscopy  2012    left knee  . Cardiac catheterization      no PCI approx 10 years ago - [place]  . Finger surgery      right ring finger  . Knee surgery       (Not in a hospital admission) Allergies  Allergen Reactions  . Sulfur Itching    History  Substance Use Topics  . Smoking status: Never Smoker   . Smokeless tobacco: Never Used  . Alcohol Use: No    Family History  Problem Relation Age of Onset  . Diabetes Mother   . Cancer Mother     history breast cancer  . Hypertension Mother   . Cancer Father     prostate  . Alzheimer's disease Father   . Diabetes Father   . Hypertension Father   . Cancer Maternal Uncle     colon  . Colon cancer Maternal Uncle 51  . Cancer Maternal Grandmother     breast  . Cancer Maternal Grandfather     prostate  . Cancer Maternal Uncle     prostate  . Colon cancer Maternal   Uncle 58  . Cancer Maternal Uncle     prostate  . Cancer Cousin 47    metastatic colon cancer?  . Colon polyps Paternal Uncle   . Esophageal cancer Neg Hx      Review of Systems  Constitutional: Negative.   HENT: Positive for congestion. Negative for ear discharge, ear pain, hearing loss, sore throat and tinnitus.   Eyes: Negative.   Respiratory: Negative.   Cardiovascular: Negative.   Gastrointestinal: Negative.   Genitourinary: Negative.   Musculoskeletal: Positive for joint pain.  Skin: Negative.   Neurological: Negative.   Endo/Heme/Allergies: Negative.   Psychiatric/Behavioral: Negative.     Objective:  Physical Exam  Constitutional: He is oriented to person, place, and time. He  appears well-developed and well-nourished.  HENT:  Head: Normocephalic and atraumatic.  Eyes: EOM are normal. Pupils are equal, round, and reactive to light.  Neck: Normal range of motion. Neck supple.  Cardiovascular: Normal rate, regular rhythm and normal heart sounds.  Exam reveals no gallop and no friction rub.   No murmur heard. Respiratory: Effort normal and breath sounds normal. No respiratory distress. He has no wheezes. He has no rales.  GI: Soft. Bowel sounds are normal. He exhibits no distension. There is no tenderness. There is no rebound.  Musculoskeletal:  Varus thrust.  ROM from 0 degrees of extension to 100 degrees of extension.  Ligaments are stable.  Neurovascularly intact distally.  Neurological: He is alert and oriented to person, place, and time.  Skin: Skin is warm and dry.  Psychiatric: He has a normal mood and affect. His behavior is normal. Judgment and thought content normal.    Vital signs in last 24 hours: @VSRANGES @  Labs:   Estimated body mass index is 31.90 kg/(m^2) as calculated from the following:   Height as of 12/06/13: 6\' 4"  (1.93 m).   Weight as of 12/06/13: 118.842 kg (262 lb).   Imaging Review Plain radiographs demonstrate severe degenerative joint disease of the left knee(s). The overall alignment ismild varus. The bone quality appears to be fair for age and reported activity level.  Assessment/Plan:  End stage arthritis, left knee   The patient history, physical examination, clinical judgment of the provider and imaging studies are consistent with end stage degenerative joint disease of the left knee(s) and total knee arthroplasty is deemed medically necessary. The treatment options including medical management, injection therapy arthroscopy and arthroplasty were discussed at length. The risks and benefits of total knee arthroplasty were presented and reviewed. The risks due to aseptic loosening, infection, stiffness, patella tracking problems,  thromboembolic complications and other imponderables were discussed. The patient acknowledged the explanation, agreed to proceed with the plan and consent was signed. Patient is being admitted for inpatient treatment for surgery, pain control, PT, OT, prophylactic antibiotics, VTE prophylaxis, progressive ambulation and ADL's and discharge planning. The patient is planning to be discharged home with home health services

## 2013-12-19 NOTE — Pre-Procedure Instructions (Signed)
Maleiyah Releford [facility]  12/19/2013   Your procedure is scheduled on:  12/28/2013  Report to [facility]  2 * 3  Entrance A-  Proceed to admitting office  at 11:00 AM.  Call this number if you have problems the morning of surgery: [phone]   Remember:   Do not eat food or drink liquids after midnight.  0N tuesday   Take these medicines the morning of surgery with A SIP OF WATER: flonase,   Do not wear jewelry, make-up or nail polish.  Do not wear lotions, powders, or perfumes. You may wear deodorant.   Men may shave face and neck.  Do not bring valuables to the hospital.  [facility] is not responsible for any belongings or valuables.               Contacts, dentures or bridgework may not be worn into surgery.  Leave suitcase in the car. After surgery it may be brought to your room.  For patients admitted to the hospital, discharge time is determined by your                treatment team.               Patients discharged the day of surgery will not be allowed to drive home.  Name and phone number of your driver: WITH FAMILY  Special Instructions: Special Instructions: [facility] - Preparing for Surgery  Before surgery, you can play an important role.  Because skin is not sterile, your skin needs to be as free of germs as possible.  You can reduce the number of germs on you skin by washing with CHG (chlorahexidine gluconate) soap before surgery.  CHG is an antiseptic cleaner which kills germs and bonds with the skin to continue killing germs even after washing.  Please DO NOT use if you have an allergy to CHG or antibacterial soaps.  If your skin becomes reddened/irritated stop using the CHG and inform your nurse when you arrive at Short Stay.  Do not shave (including legs and underarms) for at least 48 hours prior to the first CHG shower.  You may shave your face.  Please follow these instructions carefully:   1.  Shower with CHG Soap the night before surgery and  the  morning of Surgery.  2.  If you choose to wash your hair, wash your hair first as usual with your  normal shampoo.  3.  After you shampoo, rinse your hair and body thoroughly to remove the  Shampoo.  4.  Use CHG as you would any other liquid soap.  You can apply chg directly to the skin and wash gently with scrungie or a clean washcloth.  5.  Apply the CHG Soap to your body ONLY FROM THE NECK DOWN.    Do not use on open wounds or open sores.  Avoid contact with your eyes, ears, mouth and genitals (private parts).  Wash genitals (private parts)   with your normal soap.  6.  Wash thoroughly, paying special attention to the area where your surgery will be performed.  7.  Thoroughly rinse your body with warm water from the neck down.  8.  DO NOT shower/wash with your normal soap after using and rinsing off   the CHG Soap.  9.  Pat yourself dry with a clean towel.            10.  Wear clean pajamas.  11.  Place clean sheets on your bed the night of your first shower and do not sleep with pets.  Day of Surgery  Do not apply any lotions/deodorants the morning of surgery.   Please wear clean clothes to the hospital/surgery center.   Please read over the following fact sheets that you were given: Pain Booklet, Coughing and Deep Breathing, Blood Transfusion Information, MRSA Information and Surgical Site Infection Prevention

## 2013-12-20 ENCOUNTER — Encounter (HOSPITAL_COMMUNITY): Payer: Self-pay

## 2013-12-20 ENCOUNTER — Telehealth: Payer: Self-pay | Admitting: Physician Assistant

## 2013-12-20 ENCOUNTER — Encounter (HOSPITAL_COMMUNITY)
Admission: RE | Admit: 2013-12-20 | Discharge: 2013-12-20 | Disposition: A | Discharge status: Home / Self Care | Payer: BC Managed Care – PPO | Source: Ambulatory Visit | Attending: Orthopedic Surgery | Admitting: Orthopedic Surgery

## 2013-12-20 ENCOUNTER — Other Ambulatory Visit: Payer: Self-pay | Admitting: Family

## 2013-12-20 HISTORY — DX: Attention-deficit hyperactivity disorder, unspecified type: F90.9

## 2013-12-20 HISTORY — DX: Chronic sinusitis, unspecified: J32.9

## 2013-12-20 LAB — URINALYSIS, ROUTINE W REFLEX MICROSCOPIC
Bilirubin Urine: NEGATIVE
Glucose, UA: NEGATIVE mg/dL
HGB URINE DIPSTICK: NEGATIVE
Ketones, ur: NEGATIVE mg/dL
Leukocytes, UA: NEGATIVE
Nitrite: NEGATIVE
Protein, ur: NEGATIVE mg/dL
SPECIFIC GRAVITY, URINE: 1.016 (ref 1.005–1.030)
Urobilinogen, UA: 0.2 mg/dL (ref 0.0–1.0)
pH: 7.5 (ref 5.0–8.0)

## 2013-12-20 LAB — CBC WITH DIFFERENTIAL/PLATELET
Basophils Absolute: 0 10*3/uL (ref 0.0–0.1)
Basophils Relative: 0 % (ref 0–1)
EOS PCT: 2 % (ref 0–5)
Eosinophils Absolute: 0.1 10*3/uL (ref 0.0–0.7)
HEMATOCRIT: 42.3 % (ref 39.0–52.0)
Hemoglobin: 14.6 g/dL (ref 13.0–17.0)
LYMPHS PCT: 36 % (ref 12–46)
Lymphs Abs: 2.1 10*3/uL (ref 0.7–4.0)
MCH: 28.5 pg (ref 26.0–34.0)
MCHC: 34.5 g/dL (ref 30.0–36.0)
MCV: 82.6 fL (ref 78.0–100.0)
MONOS PCT: 8 % (ref 3–12)
Monocytes Absolute: 0.5 10*3/uL (ref 0.1–1.0)
Neutro Abs: 3.1 10*3/uL (ref 1.7–7.7)
Neutrophils Relative %: 54 % (ref 43–77)
PLATELETS: 327 10*3/uL (ref 150–400)
RBC: 5.12 MIL/uL (ref 4.22–5.81)
RDW: 12.8 % (ref 11.5–15.5)
WBC: 5.8 10*3/uL (ref 4.0–10.5)

## 2013-12-20 LAB — COMPREHENSIVE METABOLIC PANEL
ALK PHOS: 57 U/L (ref 39–117)
ALT: 26 U/L (ref 0–53)
AST: 20 U/L (ref 0–37)
Albumin: 4 g/dL (ref 3.5–5.2)
BUN: 9 mg/dL (ref 6–23)
CALCIUM: 9.7 mg/dL (ref 8.4–10.5)
CO2: 25 meq/L (ref 19–32)
Chloride: 101 mEq/L (ref 96–112)
Creatinine, Ser: 0.73 mg/dL (ref 0.50–1.35)
GFR calc non Af Amer: >90 mL/min (ref >90)
GLUCOSE: 105 mg/dL — AB (ref 70–99)
Potassium: 4.3 mEq/L (ref 3.7–5.3)
SODIUM: 141 meq/L (ref 137–147)
Total Bilirubin: 0.4 mg/dL (ref 0.3–1.2)
Total Protein: 7.1 g/dL (ref 6.0–8.3)

## 2013-12-20 LAB — PROTIME-INR
INR: 0.95 (ref 0.00–1.49)
Prothrombin Time: 12.5 seconds (ref 11.6–15.2)

## 2013-12-20 LAB — APTT: aPTT: 29 seconds (ref 24–37)

## 2013-12-20 LAB — TYPE AND SCREEN
ABO/RH(D): O NEG
Antibody Screen: NEGATIVE

## 2013-12-20 LAB — SURGICAL PCR SCREEN
MRSA, PCR: NEGATIVE
Staphylococcus aureus: NEGATIVE

## 2013-12-20 NOTE — Telephone Encounter (Signed)
Lets see him back in the office first since his cough is not getting better.

## 2013-12-20 NOTE — Telephone Encounter (Signed)
Requesting refill on cough med tessalon

## 2013-12-20 NOTE — Telephone Encounter (Signed)
Notified pt and scheduled appt for 12/21/13 @ 7:45am.

## 2013-12-20 NOTE — Progress Notes (Signed)
PCP: Steve [name] at [facility]  Cardiologist: Pt. Doesn't remember, Dr. Mare Ferrari was the clinician that oversaw  The stress test.  Pt. Med rec had down he was taking metoprolol. Pt. Stated he started taking it in Nov. 2014 for high pulse rate and was to take it prior to surgery.  His surgery was rescheduled x2 due to pt. Having the  flu. Pt. States he finished taking the medication more than 30 days ago and there were no refills. I encouraged him to calll his doctor Steve [name]) and follow up with her if he needed to continue taking the medication. I also told him if she puts him back on the medicine then he should take it the morning of surgery with a sip of water. Pt. Verbalized understanding of this.

## 2013-12-21 ENCOUNTER — Ambulatory Visit (INDEPENDENT_AMBULATORY_CARE_PROVIDER_SITE_OTHER): Payer: BC Managed Care – PPO | Admitting: Family

## 2013-12-21 ENCOUNTER — Ambulatory Visit (HOSPITAL_BASED_OUTPATIENT_CLINIC_OR_DEPARTMENT_OTHER)
Admission: RE | Admit: 2013-12-21 | Discharge: 2013-12-21 | Disposition: A | Discharge status: Home / Self Care | Payer: BC Managed Care – PPO | Source: Ambulatory Visit | Attending: Family | Admitting: Family

## 2013-12-21 ENCOUNTER — Encounter: Payer: Self-pay | Admitting: Family

## 2013-12-21 ENCOUNTER — Other Ambulatory Visit: Payer: Self-pay | Admitting: Family

## 2013-12-21 VITALS — BP 118/78 | HR 95 | Temp 97.5°F | Resp 20 | Ht 76.0 in | Wt 253.1 lb

## 2013-12-21 DIAGNOSIS — R05 Cough: Secondary | ICD-10-CM

## 2013-12-21 DIAGNOSIS — J111 Influenza due to unidentified influenza virus with other respiratory manifestations: Secondary | ICD-10-CM

## 2013-12-21 DIAGNOSIS — R059 Cough, unspecified: Secondary | ICD-10-CM

## 2013-12-21 LAB — URINE CULTURE
COLONY COUNT: NO GROWTH
Culture: NO GROWTH

## 2013-12-21 MED ORDER — ALBUTEROL SULFATE HFA 108 (90 BASE) MCG/ACT IN AERS: 2.0000 | INHALATION_SPRAY | Freq: Four times a day (QID) | RESPIRATORY_TRACT | Status: DC | PRN

## 2013-12-21 MED ORDER — BENZONATATE 100 MG PO CAPS: 100.0000 mg | ORAL_CAPSULE | Freq: Three times a day (TID) | ORAL | Status: DC | PRN

## 2013-12-21 NOTE — Telephone Encounter (Signed)
Left detailed message on cell# re: below recommendations and to call back with response.

## 2013-12-21 NOTE — Patient Instructions (Signed)
Stop lisinopril. Start albuterol 2 puffs every 6 hours for the next few days. You may use tessalon as needed for cough. Complete chest x ray on the first floor. Follow up on Monday.

## 2013-12-21 NOTE — Progress Notes (Signed)
Pre visit review using our clinic review tool, if applicable. No additional management support is needed unless otherwise documented below in the visit note. 

## 2013-12-21 NOTE — Telephone Encounter (Signed)
Please call pt and let him know that his cxr is negative for pneumonia.  Is he taking amoxicillin? I see this on his list.  If so, he should complete.  If not, he should start zpak (pended below) Stop lisinopril as we discussed.  Start albuterol 2 puffs every 6 hours, tessalon prn.  Follow up on Monday.

## 2013-12-21 NOTE — Assessment & Plan Note (Addendum)
Influenza is resolved, but now left with bronchitis/cough. CXR negative for infiltrate. Will plan albuterol, Abx (see phone note) D/C ace in case this is contributing. Tessalon prn cough. Follow up on Monday.

## 2013-12-21 NOTE — Progress Notes (Signed)
Subjective:    Patient ID: Steve [name], male    DOB: 05/19/1961, 53 y.o.   MRN: [id number]  HPI  Steve [name] is a 53 yr old male who presents today with chief complaint of cough. Cough has been present for 1 month. Cough is worse at night. Has associated sore throat, chest/nasal congestion, headache, low grade subjective fever and sweating. Has vomitting with coughing due to gag. Cough is described as dry.   He was seen in our office on 12/06/13 with chief complaint of cough and rapid flu test revealed influenza B.  He was seen in the ED on 12/09/13.  CXR at that time was negative for acute infiltrate.  He is scheduled for TKA next Wednesday.   Review of Systems See HPI  Past Medical History  Diagnosis Date  . Diabetes mellitus without complication   . Hypertension   . Hyperlipidemia   . Complication of anesthesia     has night terrors after anesthesia but can be violent when waking up  . 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 Pt stated "I forget alot of things"  . Frequent urination at night     when blood sugar runs high  . Psoriasis of scalp     nose and face; occurs mainly in winter   . Seasonal allergies   . CAD (coronary artery disease)   . Headache(784.0)   . Sinus infection 12/16/13  . ADHD (attention deficit hyperactivity disorder)     History   Social History  . Marital Status: Married    Spouse Name: N/A    Number of Children: 6  . Years of Education: N/A   Occupational History  . Not on file.   Social History Main Topics  . Smoking status: Never Smoker   . Smokeless tobacco: Never Used  . Alcohol Use: No  . Drug Use: No  . Sexual Activity: Yes    Partners: Female   Other Topics Concern  . Not on file   Social History Narrative   Regular exercise: very little   Caffeine use: sweet tea daily   6 children   Works in Architect, owns concession.   Married   Enjoys televition.      Past Surgical History  Procedure Laterality  Date  . Hernia repair  2010    abdominal hernia  . Knee arthroscopy  2012    left knee  . Cardiac catheterization      no PCI approx 10 years ago - [place] surgery      right ring finger  . Knee surgery      Family History  Problem Relation Age of Onset  . Diabetes Mother   . Cancer Mother     history breast cancer  . Hypertension Mother   . Cancer Father     prostate  . Alzheimer's disease Father   . Diabetes Father   . Hypertension Father   . Cancer Maternal Uncle     colon  . Colon cancer Maternal Uncle 67  . Cancer Maternal Grandmother     breast  . Cancer Maternal Grandfather     prostate  . Cancer Maternal Uncle     prostate  . Colon cancer Maternal Uncle 22  . Cancer Maternal Uncle     prostate  . Cancer Cousin 47    metastatic colon cancer?  . Colon polyps Paternal Uncle   . Esophageal cancer  Neg Hx     Allergies  Allergen Reactions  . Sulfur Itching    Current Outpatient Prescriptions on File Prior to Visit  Medication Sig Dispense Refill  . amoxicillin (AMOXIL) 500 MG capsule Take 500 mg by mouth every 12 (twelve) hours. 30 day supply. Started 12/16/13      . benzonatate (TESSALON) 100 MG capsule Take 100 mg by mouth 3 (three) times daily as needed for cough.      . fluticasone (FLONASE) 50 MCG/ACT nasal spray Place 2 sprays into both nostrils daily.  16 g  6  . insulin aspart (NOVOLOG FLEXPEN) 100 UNIT/ML FlexPen Inject 0-12 Units into the skin 3 (three) times daily with meals. sliding scale- check sugar and inject 3 times daily before meals as below:  <150-   Zero units 150-200 2 units 201-250 4 units 251-300 6 units 301-350 8 units 351-400 10 units >400             12 units and contact us.      . insulin glargine (LANTUS) 100 UNIT/ML injection Inject 0.35 mLs (35 Units total) into the skin at bedtime. Sliding scale  10 mL  3  . lisinopril (PRINIVIL,ZESTRIL) 2.5 MG tablet Take 2.5 mg by mouth daily.      . metFORMIN (GLUCOPHAGE)  1000 MG tablet Take 1 tablet (1,000 mg total) by mouth 2 (two) times daily with a meal.  60 tablet  2  . Multiple Vitamins-Minerals (ZINC PO) Take 2 tablets by mouth every morning.      . sitaGLIPtin (JANUVIA) 100 MG tablet Take 100 mg by mouth daily.       No current facility-administered medications on file prior to visit.    BP 118/78  Pulse 95  Temp(Src) 97.5 F (36.4 C) (Oral)  Resp 20  Ht 6\' 4"  (1.93 m)  Wt 253 lb 1.9 oz (114.814 kg)  BMI 30.82 kg/m2  SpO2 99%       Objective:   Physical Exam  Constitutional: He is oriented to person, place, and time. He appears well-developed and well-nourished. No distress.  HENT:  Head: Normocephalic and atraumatic.  Right Ear: Tympanic membrane and ear canal normal.  Left Ear: Tympanic membrane and ear canal normal.  Mouth/Throat: No oropharyngeal exudate, posterior oropharyngeal edema or posterior oropharyngeal erythema.  Neck: No thyromegaly present.  Cardiovascular: Normal rate and regular rhythm.   No murmur heard. Pulmonary/Chest: Effort normal and breath sounds normal. No respiratory distress. He has no wheezes. He has no rales. He exhibits no tenderness.  Lymphadenopathy:    He has no cervical adenopathy.  Neurological: He is alert and oriented to person, place, and time.  Skin: Skin is warm and dry.  Psychiatric: He has a normal mood and affect. His behavior is normal. Judgment and thought content normal.          Assessment & Plan:

## 2013-12-23 NOTE — Telephone Encounter (Signed)
Notified pt and he states he was placed on amoxicillin for 1 month and he was advised to complete course. Pt voices understanding of below instructions and follow up scheduled for Monday at 2pm.

## 2013-12-26 ENCOUNTER — Ambulatory Visit (INDEPENDENT_AMBULATORY_CARE_PROVIDER_SITE_OTHER): Payer: BC Managed Care – PPO | Admitting: Family

## 2013-12-26 ENCOUNTER — Encounter: Payer: Self-pay | Admitting: Family

## 2013-12-26 VITALS — BP 130/84 | HR 96 | Temp 97.6°F | Ht 76.0 in | Wt 260.0 lb

## 2013-12-26 DIAGNOSIS — R059 Cough, unspecified: Secondary | ICD-10-CM

## 2013-12-26 DIAGNOSIS — R05 Cough: Secondary | ICD-10-CM

## 2013-12-26 NOTE — Progress Notes (Signed)
Pre visit review using our clinic review tool, if applicable. No additional management support is needed unless otherwise documented below in the visit note. 

## 2013-12-26 NOTE — Assessment & Plan Note (Signed)
At this point, I do not think that his cough is due to infectious etiology.  I think that his cough is either due to post nasal drip from chronic sinusitis which is being treated by ENT.  It looks like the ENT physician spoke with ortho and they do not think that the sinusitis should postpone surgery.  He is off ACE which can cause cough that can last several weeks.  I think it is ok to proceed with planned surgery.  Pt is instructed to follow up in 1 month. Consider ARB for renal protection at that visit if cough is resolved.

## 2013-12-26 NOTE — Patient Instructions (Signed)
Please follow up in 1 month.  Call if cough worsens or if it does not continue to improve.

## 2013-12-26 NOTE — Progress Notes (Signed)
Subjective:    Patient ID: Steve [name], male    DOB: 1961/03/02, 53 y.o.   MRN: [id number]  HPI  Steve [name] is a 53 yr old male who presents today for follow up of his cough. He was last seen on 3/11 and ACE was discontinued.  CXR was negative.  He is being treated with augmentin for his sinusitis by ENT.  Tessalon was added prn.    He reports some improvement in his cough since last visit.   Review of Systems See HPI  Past Medical History  Diagnosis Date  . Diabetes mellitus without complication   . Hypertension   . Hyperlipidemia   . Complication of anesthesia     has night terrors after anesthesia but can be violent when waking up  . 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 Pt stated "I forget alot of things"  . Frequent urination at night     when blood sugar runs high  . Psoriasis of scalp     nose and face; occurs mainly in winter   . Seasonal allergies   . CAD (coronary artery disease)   . Headache(784.0)   . Sinus infection 12/16/13  . ADHD (attention deficit hyperactivity disorder)     History   Social History  . Marital Status: Married    Spouse Name: N/A    Number of Children: 6  . Years of Education: N/A   Occupational History  . Not on file.   Social History Main Topics  . Smoking status: Never Smoker   . Smokeless tobacco: Never Used  . Alcohol Use: No  . Drug Use: No  . Sexual Activity: Yes    Partners: Female   Other Topics Concern  . Not on file   Social History Narrative   Regular exercise: very little   Caffeine use: sweet tea daily   6 children   Works in Architect, owns concession.   Married   Enjoys televition.      Past Surgical History  Procedure Laterality Date  . Hernia repair  2010    abdominal hernia  . Knee arthroscopy  2012    left knee  . Cardiac catheterization      no PCI approx 10 years ago - [place] surgery      right ring finger  . Knee surgery      Family History    Problem Relation Age of Onset  . Diabetes Mother   . Cancer Mother     history breast cancer  . Hypertension Mother   . Cancer Father     prostate  . Alzheimer's disease Father   . Diabetes Father   . Hypertension Father   . Cancer Maternal Uncle     colon  . Colon cancer Maternal Uncle 67  . Cancer Maternal Grandmother     breast  . Cancer Maternal Grandfather     prostate  . Cancer Maternal Uncle     prostate  . Colon cancer Maternal Uncle 64  . Cancer Maternal Uncle     prostate  . Cancer Cousin 47    metastatic colon cancer?  . Colon polyps Paternal Uncle   . Esophageal cancer Neg Hx     Allergies  Allergen Reactions  . Sulfur Itching    Current Outpatient Prescriptions on File Prior to Visit  Medication Sig Dispense Refill  . albuterol (PROVENTIL HFA;VENTOLIN HFA) 108 (90 BASE) MCG/ACT  inhaler Inhale 2 puffs into the lungs every 6 (six) hours as needed for wheezing or shortness of breath.  1 Inhaler  0  . amoxicillin (AMOXIL) 500 MG capsule Take 500 mg by mouth every 12 (twelve) hours. 30 day supply. Started 12/16/13      . benzonatate (TESSALON) 100 MG capsule Take 1 capsule (100 mg total) by mouth 3 (three) times daily as needed for cough.  30 capsule  0  . fluticasone (FLONASE) 50 MCG/ACT nasal spray Place 2 sprays into both nostrils daily.  16 g  6  . insulin aspart (NOVOLOG FLEXPEN) 100 UNIT/ML FlexPen Inject 0-12 Units into the skin 3 (three) times daily with meals. sliding scale- check sugar and inject 3 times daily before meals as below:  <150-   Zero units 150-200 2 units 201-250 4 units 251-300 6 units 301-350 8 units 351-400 10 units >400             12 units and contact us.      . insulin glargine (LANTUS) 100 UNIT/ML injection Inject 0.35 mLs (35 Units total) into the skin at bedtime. Sliding scale  10 mL  3  . lisinopril (PRINIVIL,ZESTRIL) 2.5 MG tablet Take 2.5 mg by mouth daily.      . metFORMIN (GLUCOPHAGE) 1000 MG tablet Take 1 tablet (1,000 mg  total) by mouth 2 (two) times daily with a meal.  60 tablet  2  . Multiple Vitamins-Minerals (ZINC PO) Take 2 tablets by mouth every morning.      . sitaGLIPtin (JANUVIA) 100 MG tablet Take 100 mg by mouth daily.       No current facility-administered medications on file prior to visit.    BP 130/84  Pulse 96  Temp(Src) 97.6 F (36.4 C) (Oral)  Ht 6\' 4"  (1.93 m)  Wt 260 lb 0.6 oz (117.953 kg)  BMI 31.67 kg/m2  SpO2 97%       Objective:   Physical Exam  Constitutional: He is oriented to person, place, and time. He appears well-developed and well-nourished. No distress.  Cardiovascular: Normal rate and regular rhythm.   No murmur heard. Pulmonary/Chest: Effort normal and breath sounds normal. No respiratory distress. He has no wheezes. He has no rales. He exhibits no tenderness.  Musculoskeletal: He exhibits no edema.  Neurological: He is alert and oriented to person, place, and time.  Psychiatric: He has a normal mood and affect. His behavior is normal. Judgment and thought content normal.          Assessment & Plan:

## 2013-12-27 MED ORDER — CHLORHEXIDINE GLUCONATE 4 % EX LIQD
60.0000 mL | Freq: Once | CUTANEOUS | Status: DC
  Filled 2013-12-27: qty 60

## 2013-12-27 MED ORDER — CEFAZOLIN SODIUM-DEXTROSE 2-3 GM-% IV SOLR
2.0000 g | INTRAVENOUS | Status: AC
  Administered 2013-12-28: 2 g via INTRAVENOUS
  Filled 2013-12-27: qty 50

## 2013-12-27 NOTE — Progress Notes (Signed)
Left message for patient to arrive at short stay at 0930 tomorrow morning.

## 2013-12-28 ENCOUNTER — Inpatient Hospital Stay (HOSPITAL_COMMUNITY): Payer: BC Managed Care – PPO

## 2013-12-28 ENCOUNTER — Inpatient Hospital Stay (HOSPITAL_COMMUNITY): Payer: BC Managed Care – PPO | Admitting: Anesthesiology

## 2013-12-28 ENCOUNTER — Encounter (HOSPITAL_COMMUNITY): Payer: BC Managed Care – PPO | Admitting: Anesthesiology

## 2013-12-28 ENCOUNTER — Other Ambulatory Visit: Payer: Self-pay | Admitting: Family

## 2013-12-28 ENCOUNTER — Encounter (HOSPITAL_COMMUNITY)
Admission: RE | Disposition: A | Discharge status: Home / Self Care | Payer: Self-pay | Source: Ambulatory Visit | Attending: Orthopedic Surgery

## 2013-12-28 ENCOUNTER — Encounter (HOSPITAL_COMMUNITY): Payer: Self-pay | Admitting: *Deleted

## 2013-12-28 ENCOUNTER — Inpatient Hospital Stay (HOSPITAL_COMMUNITY)
Admission: RE | Admit: 2013-12-28 | Discharge: 2013-12-30 | DRG: 470 | Disposition: A | Discharge status: Home / Self Care | Payer: BC Managed Care – PPO | Source: Ambulatory Visit | Attending: Orthopedic Surgery | Admitting: Orthopedic Surgery

## 2013-12-28 DIAGNOSIS — M1710 Unilateral primary osteoarthritis, unspecified knee: Principal | ICD-10-CM | POA: Diagnosis present

## 2013-12-28 DIAGNOSIS — R413 Other amnesia: Secondary | ICD-10-CM | POA: Diagnosis present

## 2013-12-28 DIAGNOSIS — Z7982 Long term (current) use of aspirin: Secondary | ICD-10-CM

## 2013-12-28 DIAGNOSIS — F909 Attention-deficit hyperactivity disorder, unspecified type: Secondary | ICD-10-CM | POA: Diagnosis present

## 2013-12-28 DIAGNOSIS — I10 Essential (primary) hypertension: Secondary | ICD-10-CM | POA: Diagnosis present

## 2013-12-28 DIAGNOSIS — M179 Osteoarthritis of knee, unspecified: Secondary | ICD-10-CM

## 2013-12-28 DIAGNOSIS — E785 Hyperlipidemia, unspecified: Secondary | ICD-10-CM | POA: Diagnosis present

## 2013-12-28 DIAGNOSIS — I2510 Atherosclerotic heart disease of native coronary artery without angina pectoris: Secondary | ICD-10-CM | POA: Diagnosis present

## 2013-12-28 DIAGNOSIS — Z794 Long term (current) use of insulin: Secondary | ICD-10-CM

## 2013-12-28 DIAGNOSIS — Z79899 Other long term (current) drug therapy: Secondary | ICD-10-CM

## 2013-12-28 DIAGNOSIS — E119 Type 2 diabetes mellitus without complications: Secondary | ICD-10-CM | POA: Diagnosis present

## 2013-12-28 HISTORY — PX: TOTAL KNEE ARTHROPLASTY: SHX125

## 2013-12-28 LAB — GLUCOSE, CAPILLARY
GLUCOSE-CAPILLARY: 199 mg/dL — AB (ref 70–99)
Glucose-Capillary: 196 mg/dL — ABNORMAL HIGH (ref 70–99)
Glucose-Capillary: 181 mg/dL — ABNORMAL HIGH (ref 70–99)
Glucose-Capillary: 302 mg/dL — ABNORMAL HIGH (ref 70–99)

## 2013-12-28 SURGERY — ARTHROPLASTY, KNEE, TOTAL
Anesthesia: General | Site: Knee | Laterality: Left

## 2013-12-28 MED ORDER — HYDROMORPHONE HCL PF 1 MG/ML IJ SOLN
0.2500 mg | INTRAMUSCULAR | Status: DC | PRN
  Administered 2013-12-28 (×4): 0.5 mg via INTRAVENOUS

## 2013-12-28 MED ORDER — PROPOFOL 10 MG/ML IV BOLUS
INTRAVENOUS | Status: AC
  Filled 2013-12-28: qty 20

## 2013-12-28 MED ORDER — SODIUM CHLORIDE 0.9 % IR SOLN
Status: DC | PRN
  Administered 2013-12-28: 1000 mL

## 2013-12-28 MED ORDER — ONDANSETRON HCL 4 MG/2ML IJ SOLN: 4.0000 mg | Freq: Once | INTRAMUSCULAR | Status: DC | PRN

## 2013-12-28 MED ORDER — LIDOCAINE HCL (CARDIAC) 20 MG/ML IV SOLN
INTRAVENOUS | Status: AC
  Filled 2013-12-28: qty 5

## 2013-12-28 MED ORDER — LIDOCAINE HCL (CARDIAC) 20 MG/ML IV SOLN
INTRAVENOUS | Status: DC | PRN
  Administered 2013-12-28: 40 mg via INTRAVENOUS

## 2013-12-28 MED ORDER — HYDROMORPHONE HCL PF 1 MG/ML IJ SOLN
0.5000 mg | INTRAMUSCULAR | Status: DC | PRN
  Administered 2013-12-28 – 2013-12-29 (×4): 1 mg via INTRAVENOUS
  Filled 2013-12-28 (×5): qty 1

## 2013-12-28 MED ORDER — LACTATED RINGERS IV SOLN
INTRAVENOUS | Status: DC
  Administered 2013-12-28: 12:00:00 via INTRAVENOUS

## 2013-12-28 MED ORDER — METOCLOPRAMIDE HCL 5 MG PO TABS
5.0000 mg | ORAL_TABLET | Freq: Three times a day (TID) | ORAL | Status: DC | PRN
  Filled 2013-12-28: qty 2

## 2013-12-28 MED ORDER — OXYCODONE HCL 5 MG PO TABS
5.0000 mg | ORAL_TABLET | ORAL | Status: DC | PRN
  Administered 2013-12-28 – 2013-12-30 (×11): 10 mg via ORAL
  Filled 2013-12-28 (×11): qty 2

## 2013-12-28 MED ORDER — MIDAZOLAM HCL 2 MG/2ML IJ SOLN
INTRAMUSCULAR | Status: AC
  Filled 2013-12-28: qty 2

## 2013-12-28 MED ORDER — ARTIFICIAL TEARS OP OINT
TOPICAL_OINTMENT | OPHTHALMIC | Status: AC
  Filled 2013-12-28: qty 3.5

## 2013-12-28 MED ORDER — HYDROMORPHONE HCL PF 1 MG/ML IJ SOLN
INTRAMUSCULAR | Status: AC
  Filled 2013-12-28: qty 1

## 2013-12-28 MED ORDER — INSULIN GLARGINE 100 UNIT/ML ~~LOC~~ SOLN
35.0000 [IU] | Freq: Every day | SUBCUTANEOUS | Status: DC
  Administered 2013-12-29 (×2): 35 [IU] via SUBCUTANEOUS
  Filled 2013-12-28 (×3): qty 0.35

## 2013-12-28 MED ORDER — HYDROMORPHONE HCL PF 1 MG/ML IJ SOLN
INTRAMUSCULAR | Status: AC
  Administered 2013-12-28: 0.5 mg via INTRAVENOUS
  Filled 2013-12-28: qty 1

## 2013-12-28 MED ORDER — BUPIVACAINE LIPOSOME 1.3 % IJ SUSP
INTRAMUSCULAR | Status: DC | PRN
  Administered 2013-12-28: 20 mL

## 2013-12-28 MED ORDER — ASPIRIN EC 325 MG PO TBEC
325.0000 mg | DELAYED_RELEASE_TABLET | Freq: Every day | ORAL | Status: DC
  Administered 2013-12-29 – 2013-12-30 (×2): 325 mg via ORAL
  Filled 2013-12-28 (×3): qty 1

## 2013-12-28 MED ORDER — OXYCODONE HCL 5 MG PO TABS: 5.0000 mg | ORAL_TABLET | Freq: Once | ORAL | Status: DC | PRN

## 2013-12-28 MED ORDER — ROCURONIUM BROMIDE 50 MG/5ML IV SOLN
INTRAVENOUS | Status: AC
  Filled 2013-12-28: qty 1

## 2013-12-28 MED ORDER — METOCLOPRAMIDE HCL 5 MG/ML IJ SOLN: 5.0000 mg | Freq: Three times a day (TID) | INTRAMUSCULAR | Status: DC | PRN

## 2013-12-28 MED ORDER — LISINOPRIL 2.5 MG PO TABS
2.5000 mg | ORAL_TABLET | Freq: Every day | ORAL | Status: DC
  Administered 2013-12-30: 2.5 mg via ORAL
  Filled 2013-12-28 (×3): qty 1

## 2013-12-28 MED ORDER — PHENOL 1.4 % MT LIQD
1.0000 | OROMUCOSAL | Status: DC | PRN
  Administered 2013-12-29: 1 via OROMUCOSAL
  Filled 2013-12-28: qty 177

## 2013-12-28 MED ORDER — INSULIN ASPART 100 UNIT/ML ~~LOC~~ SOLN
0.0000 [IU] | Freq: Three times a day (TID) | SUBCUTANEOUS | Status: DC
  Administered 2013-12-29: 5 [IU] via SUBCUTANEOUS
  Administered 2013-12-29: 2 [IU] via SUBCUTANEOUS
  Administered 2013-12-29 – 2013-12-30 (×3): 3 [IU] via SUBCUTANEOUS

## 2013-12-28 MED ORDER — OXYCODONE HCL 5 MG/5ML PO SOLN: 5.0000 mg | Freq: Once | ORAL | Status: DC | PRN

## 2013-12-28 MED ORDER — ALBUTEROL SULFATE (2.5 MG/3ML) 0.083% IN NEBU
3.0000 mL | INHALATION_SOLUTION | Freq: Four times a day (QID) | RESPIRATORY_TRACT | Status: DC | PRN
Start: 2013-12-28 — End: 2013-12-30

## 2013-12-28 MED ORDER — ACETAMINOPHEN 325 MG PO TABS: 650.0000 mg | ORAL_TABLET | Freq: Four times a day (QID) | ORAL | Status: DC | PRN

## 2013-12-28 MED ORDER — LINAGLIPTIN 5 MG PO TABS
5.0000 mg | ORAL_TABLET | Freq: Every day | ORAL | Status: DC
  Administered 2013-12-28 – 2013-12-30 (×3): 5 mg via ORAL
  Filled 2013-12-28 (×3): qty 1

## 2013-12-28 MED ORDER — METHOCARBAMOL 100 MG/ML IJ SOLN
500.0000 mg | Freq: Four times a day (QID) | INTRAVENOUS | Status: DC | PRN
  Filled 2013-12-28: qty 5

## 2013-12-28 MED ORDER — POTASSIUM CHLORIDE IN NACL 20-0.9 MEQ/L-% IV SOLN
INTRAVENOUS | Status: DC
  Administered 2013-12-29: 04:00:00 via INTRAVENOUS
  Filled 2013-12-28 (×3): qty 1000

## 2013-12-28 MED ORDER — FLUTICASONE PROPIONATE 50 MCG/ACT NA SUSP
2.0000 | Freq: Every day | NASAL | Status: DC
  Administered 2013-12-29 – 2013-12-30 (×2): 2 via NASAL
  Filled 2013-12-28: qty 16

## 2013-12-28 MED ORDER — PROPOFOL 10 MG/ML IV BOLUS
INTRAVENOUS | Status: DC | PRN
  Administered 2013-12-28: 200 mg via INTRAVENOUS

## 2013-12-28 MED ORDER — SODIUM CHLORIDE 0.9 % IJ SOLN
INTRAMUSCULAR | Status: DC | PRN
  Administered 2013-12-28: 40 mL via INTRAVENOUS

## 2013-12-28 MED ORDER — DIPHENHYDRAMINE HCL 12.5 MG/5ML PO ELIX
12.5000 mg | ORAL_SOLUTION | ORAL | Status: DC | PRN
  Administered 2013-12-29: 25 mg via ORAL
  Filled 2013-12-28: qty 10

## 2013-12-28 MED ORDER — BUPIVACAINE LIPOSOME 1.3 % IJ SUSP
20.0000 mL | Freq: Once | INTRAMUSCULAR | Status: DC
  Filled 2013-12-28: qty 20

## 2013-12-28 MED ORDER — CEFAZOLIN SODIUM-DEXTROSE 2-3 GM-% IV SOLR
2.0000 g | Freq: Four times a day (QID) | INTRAVENOUS | Status: AC
  Administered 2013-12-28 – 2013-12-29 (×2): 2 g via INTRAVENOUS
  Filled 2013-12-28 (×2): qty 50

## 2013-12-28 MED ORDER — INSULIN ASPART 100 UNIT/ML ~~LOC~~ SOLN
0.0000 [IU] | Freq: Every day | SUBCUTANEOUS | Status: DC
  Administered 2013-12-28: 4 [IU] via SUBCUTANEOUS
  Administered 2013-12-29: 3 [IU] via SUBCUTANEOUS

## 2013-12-28 MED ORDER — BUPIVACAINE HCL (PF) 0.25 % IJ SOLN
INTRAMUSCULAR | Status: AC
  Filled 2013-12-28: qty 30

## 2013-12-28 MED ORDER — AMOXICILLIN 500 MG PO CAPS
500.0000 mg | ORAL_CAPSULE | Freq: Two times a day (BID) | ORAL | Status: DC
  Administered 2013-12-28 – 2013-12-30 (×4): 500 mg via ORAL
  Filled 2013-12-28 (×5): qty 1

## 2013-12-28 MED ORDER — ACETAMINOPHEN 650 MG RE SUPP: 650.0000 mg | Freq: Four times a day (QID) | RECTAL | Status: DC | PRN

## 2013-12-28 MED ORDER — LACTATED RINGERS IV SOLN
INTRAVENOUS | Status: DC | PRN
  Administered 2013-12-28 (×2): via INTRAVENOUS

## 2013-12-28 MED ORDER — SUFENTANIL CITRATE 50 MCG/ML IV SOLN
INTRAVENOUS | Status: DC | PRN
  Administered 2013-12-28: 5 ug via INTRAVENOUS
  Administered 2013-12-28: 20 ug via INTRAVENOUS
  Administered 2013-12-28 (×2): 10 ug via INTRAVENOUS
  Administered 2013-12-28: 5 ug via INTRAVENOUS

## 2013-12-28 MED ORDER — ONDANSETRON HCL 4 MG/2ML IJ SOLN: 4.0000 mg | Freq: Four times a day (QID) | INTRAMUSCULAR | Status: DC | PRN

## 2013-12-28 MED ORDER — 0.9 % SODIUM CHLORIDE (POUR BTL) OPTIME
TOPICAL | Status: DC | PRN
  Administered 2013-12-28: 1000 mL

## 2013-12-28 MED ORDER — SODIUM CHLORIDE 0.9 % IJ SOLN
INTRAMUSCULAR | Status: AC
  Filled 2013-12-28: qty 10

## 2013-12-28 MED ORDER — METFORMIN HCL 500 MG PO TABS
1000.0000 mg | ORAL_TABLET | Freq: Two times a day (BID) | ORAL | Status: DC
  Administered 2013-12-29 – 2013-12-30 (×3): 1000 mg via ORAL
  Filled 2013-12-28 (×5): qty 2

## 2013-12-28 MED ORDER — INSULIN ASPART 100 UNIT/ML ~~LOC~~ SOLN
3.0000 [IU] | Freq: Three times a day (TID) | SUBCUTANEOUS | Status: DC
  Administered 2013-12-29 – 2013-12-30 (×5): 3 [IU] via SUBCUTANEOUS

## 2013-12-28 MED ORDER — CELECOXIB 200 MG PO CAPS
200.0000 mg | ORAL_CAPSULE | Freq: Two times a day (BID) | ORAL | Status: DC
  Administered 2013-12-28 – 2013-12-30 (×4): 200 mg via ORAL
  Filled 2013-12-28 (×6): qty 1

## 2013-12-28 MED ORDER — SUFENTANIL CITRATE 50 MCG/ML IV SOLN
INTRAVENOUS | Status: AC
  Filled 2013-12-28: qty 1

## 2013-12-28 MED ORDER — MENTHOL 3 MG MT LOZG
1.0000 | LOZENGE | OROMUCOSAL | Status: DC | PRN
Start: 2013-12-28 — End: 2013-12-30

## 2013-12-28 MED ORDER — BENZONATATE 100 MG PO CAPS
100.0000 mg | ORAL_CAPSULE | Freq: Three times a day (TID) | ORAL | Status: DC | PRN
  Filled 2013-12-28: qty 1

## 2013-12-28 MED ORDER — ONDANSETRON HCL 4 MG PO TABS: 4.0000 mg | ORAL_TABLET | Freq: Four times a day (QID) | ORAL | Status: DC | PRN

## 2013-12-28 MED ORDER — METHOCARBAMOL 500 MG PO TABS
500.0000 mg | ORAL_TABLET | Freq: Four times a day (QID) | ORAL | Status: DC | PRN
  Administered 2013-12-28 – 2013-12-30 (×6): 500 mg via ORAL
  Filled 2013-12-28 (×6): qty 1

## 2013-12-28 MED ORDER — BISACODYL 5 MG PO TBEC
5.0000 mg | DELAYED_RELEASE_TABLET | Freq: Every day | ORAL | Status: DC | PRN
  Filled 2013-12-28: qty 1

## 2013-12-28 MED ORDER — DOCUSATE SODIUM 100 MG PO CAPS
100.0000 mg | ORAL_CAPSULE | Freq: Two times a day (BID) | ORAL | Status: DC
  Administered 2013-12-28 – 2013-12-30 (×4): 100 mg via ORAL
  Filled 2013-12-28 (×4): qty 1

## 2013-12-28 MED ORDER — FENTANYL CITRATE 0.05 MG/ML IJ SOLN
INTRAMUSCULAR | Status: AC
  Filled 2013-12-28: qty 5

## 2013-12-28 MED ORDER — MIDAZOLAM HCL 5 MG/5ML IJ SOLN
INTRAMUSCULAR | Status: DC | PRN
  Administered 2013-12-28: 2 mg via INTRAVENOUS

## 2013-12-28 SURGICAL SUPPLY — 58 items
BANDAGE ELASTIC 6 VELCRO ST LF (GAUZE/BANDAGES/DRESSINGS) ×2 IMPLANT
BANDAGE ESMARK 6X9 LF (GAUZE/BANDAGES/DRESSINGS) ×1 IMPLANT
BENZOIN TINCTURE PRP APPL 2/3 (GAUZE/BANDAGES/DRESSINGS) ×2 IMPLANT
BLADE SAG 18X100X1.27 (BLADE) ×4 IMPLANT
BNDG ESMARK 6X9 LF (GAUZE/BANDAGES/DRESSINGS) ×2
BOWL SMART MIX CTS (DISPOSABLE) ×2 IMPLANT
CEMENT BONE SIMPLEX SPEEDSET (Cement) ×4 IMPLANT
COVER SURGICAL LIGHT HANDLE (MISCELLANEOUS) ×2 IMPLANT
CUFF TOURNIQUET SINGLE 34IN LL (TOURNIQUET CUFF) ×2 IMPLANT
DRAPE EXTREMITY T 121X128X90 (DRAPE) ×2 IMPLANT
DRAPE PROXIMA HALF (DRAPES) ×2 IMPLANT
DRAPE U-SHAPE 47X51 STRL (DRAPES) ×2 IMPLANT
DRSG PAD ABDOMINAL 8X10 ST (GAUZE/BANDAGES/DRESSINGS) ×2 IMPLANT
DURAPREP 26ML APPLICATOR (WOUND CARE) ×4 IMPLANT
ELECT CAUTERY BLADE 6.4 (BLADE) ×2 IMPLANT
ELECT REM PT RETURN 9FT ADLT (ELECTROSURGICAL) ×2
ELECTRODE REM PT RTRN 9FT ADLT (ELECTROSURGICAL) ×1 IMPLANT
EVACUATOR 1/8 PVC DRAIN (DRAIN) ×2 IMPLANT
FACESHIELD LNG OPTICON STERILE (SAFETY) ×4 IMPLANT
GLOVE BIOGEL PI IND STRL 7.0 (GLOVE) ×2 IMPLANT
GLOVE BIOGEL PI INDICATOR 7.0 (GLOVE) ×2
GLOVE ECLIPSE 6.5 STRL STRAW (GLOVE) ×4 IMPLANT
GLOVE ORTHO TXT STRL SZ7.5 (GLOVE) ×2 IMPLANT
GOWN STRL REUS W/ TWL LRG LVL3 (GOWN DISPOSABLE) ×1 IMPLANT
GOWN STRL REUS W/ TWL XL LVL3 (GOWN DISPOSABLE) ×1 IMPLANT
GOWN STRL REUS W/TWL LRG LVL3 (GOWN DISPOSABLE) ×1
GOWN STRL REUS W/TWL XL LVL3 (GOWN DISPOSABLE) ×1
HANDPIECE INTERPULSE COAX TIP (DISPOSABLE) ×1
IMMOBILIZER KNEE 22 UNIV (SOFTGOODS) ×2 IMPLANT
IMMOBILIZER KNEE 24 THIGH 36 (MISCELLANEOUS) IMPLANT
IMMOBILIZER KNEE 24 UNIV (MISCELLANEOUS)
KIT BASIN OR (CUSTOM PROCEDURE TRAY) ×2 IMPLANT
KIT ROOM TURNOVER OR (KITS) ×2 IMPLANT
KNEE/VIT E POLY LINER LEVEL 1B ×2 IMPLANT
MANIFOLD NEPTUNE II (INSTRUMENTS) ×2 IMPLANT
NEEDLE 18GX1X1/2 (RX/OR ONLY) (NEEDLE) ×2 IMPLANT
NEEDLE 25GX 5/8IN NON SAFETY (NEEDLE) ×2 IMPLANT
NS IRRIG 1000ML POUR BTL (IV SOLUTION) ×2 IMPLANT
PACK TOTAL JOINT (CUSTOM PROCEDURE TRAY) ×2 IMPLANT
PAD ARMBOARD 7.5X6 YLW CONV (MISCELLANEOUS) ×4 IMPLANT
PAD CAST 4YDX4 CTTN HI CHSV (CAST SUPPLIES) ×1 IMPLANT
PADDING CAST COTTON 4X4 STRL (CAST SUPPLIES) ×1
PADDING CAST COTTON 6X4 STRL (CAST SUPPLIES) ×2 IMPLANT
SET HNDPC FAN SPRY TIP SCT (DISPOSABLE) ×1 IMPLANT
SPONGE GAUZE 4X4 12PLY (GAUZE/BANDAGES/DRESSINGS) ×2 IMPLANT
STRIP CLOSURE SKIN 1/2X4 (GAUZE/BANDAGES/DRESSINGS) ×2 IMPLANT
SUCTION FRAZIER TIP 10 FR DISP (SUCTIONS) ×2 IMPLANT
SUT MNCRL AB 4-0 PS2 18 (SUTURE) ×2 IMPLANT
SUT MON AB 2-0 CT1 36 (SUTURE) ×2 IMPLANT
SUT VIC AB 0 CT1 27 (SUTURE) ×1
SUT VIC AB 0 CT1 27XBRD ANBCTR (SUTURE) ×1 IMPLANT
SUT VIC AB 2-0 CT1 27 (SUTURE) ×1
SUT VIC AB 2-0 CT1 TAPERPNT 27 (SUTURE) ×1 IMPLANT
SYR 50ML LL SCALE MARK (SYRINGE) ×2 IMPLANT
SYR CONTROL 10ML LL (SYRINGE) ×2 IMPLANT
TOWEL OR 17X24 6PK STRL BLUE (TOWEL DISPOSABLE) ×2 IMPLANT
TOWEL OR 17X26 10 PK STRL BLUE (TOWEL DISPOSABLE) ×2 IMPLANT
WATER STERILE IRR 1000ML POUR (IV SOLUTION) ×4 IMPLANT

## 2013-12-28 NOTE — Progress Notes (Signed)
Orthopedic Tech Progress Note Patient Details:  Steve [name] [birth year] [id number]  CPM Left Knee CPM Left Knee: On Left Knee Flexion (Degrees): 60 Left Knee Extension (Degrees): 0 Additional Comments: put ohf on bed  Ortho Devices Ortho Device/Splint Location: footsie roll Ortho Device/Splint Interventions: [name] 12/28/2013, 5:34 PM

## 2013-12-28 NOTE — Interval H&P Note (Signed)
History and Physical Interval Note:  12/28/2013 8:26 AM  Steve [name]  has presented today for surgery, with the diagnosis of DJD LEFT KNEE  The various methods of treatment have been discussed with the patient and family. After consideration of risks, benefits and other options for treatment, the patient has consented to  Procedure(s): TOTAL KNEE ARTHROPLASTY (Left) as a surgical intervention .  The patient's history has been reviewed, patient examined, no change in status, stable for surgery.  I have reviewed the patient's chart and labs.  Questions were answered to the patient's satisfaction.     Dario Yono F

## 2013-12-28 NOTE — Anesthesia Procedure Notes (Signed)
Procedure Name: LMA Insertion Date/Time: 12/28/2013 2:00 PM Performed by: Izora Gala Pre-anesthesia Checklist: Patient identified, Emergency Drugs available, Suction available and Patient being monitored Patient Re-evaluated:Patient Re-evaluated prior to inductionOxygen Delivery Method: Circle system utilized Preoxygenation: Pre-oxygenation with 100% oxygen Intubation Type: IV induction LMA: LMA inserted LMA Size: 5.0 Placement Confirmation: positive ETCO2 Tube secured with: Tape Dental Injury: Teeth and Oropharynx as per pre-operative assessment

## 2013-12-28 NOTE — Preoperative (Signed)
Beta Blockers   Reason not to administer Beta Blockers:Not Applicable 

## 2013-12-28 NOTE — Anesthesia Preprocedure Evaluation (Signed)
Anesthesia Evaluation  Patient identified by MRN, date of birth, ID band Patient awake    Reviewed: Allergy & Precautions, H&P , NPO status , Patient's Chart, lab work & pertinent test results  Airway Mallampati: II TM Distance: >3 FB Neck ROM: Full    Dental  (+) Teeth Intact, Poor Dentition, Dental Advisory Given   Pulmonary  breath sounds clear to auscultation        Cardiovascular hypertension, Rhythm:Regular Rate:Normal     Neuro/Psych    GI/Hepatic   Endo/Other  diabetes  Renal/GU      Musculoskeletal   Abdominal (+) + obese,   Peds  Hematology   Anesthesia Other Findings   Reproductive/Obstetrics                           Anesthesia Physical Anesthesia Plan  ASA: III  Anesthesia Plan: General   Post-op Pain Management:    Induction: Intravenous  Airway Management Planned: Oral ETT  Additional Equipment:   Intra-op Plan:   Post-operative Plan: Extubation in OR  Informed Consent: I have reviewed the patients History and Physical, chart, labs and discussed the procedure including the risks, benefits and alternatives for the proposed anesthesia with the patient or authorized representative who has indicated his/her understanding and acceptance.   Dental advisory given  Plan Discussed with: CRNA and Anesthesiologist  Anesthesia Plan Comments: (DJD L. Knee Type 2 DM glucose 196 Htn  Plan GA with oral ETT  Roberts Gaudy, MD  )        Anesthesia Quick Evaluation

## 2013-12-28 NOTE — H&P (View-Only) (Signed)
TOTAL KNEE ADMISSION H&P  Patient is being admitted for left total knee arthroplasty.  Subjective:  Chief Complaint:left knee pain.  HPI: Steve [name], 53 y.o. male, has a history of pain and functional disability in the left knee due to arthritis and has failed non-surgical conservative treatments for greater than 12 weeks to includeNSAID's and/or analgesics, corticosteriod injections, viscosupplementation injections and activity modification.  Onset of symptoms was gradual, starting 10 years ago with gradually worsening course since that time. The patient noted prior procedures on the knee to include  arthroscopy on the left knee(s).  Patient currently rates pain in the left knee(s) at 5 out of 10 with activity. Patient has night pain, worsening of pain with activity and weight bearing, pain that interferes with activities of daily living and joint swelling.  Patient has evidence of subchondral sclerosis and joint space narrowing by imaging studies. There is no active infection.  Patient Active Problem List   Diagnosis Date Noted  . Influenza B 12/06/2013  . Gout 12/06/2013  . Allergic rhinitis 09/07/2013  . Tachycardia 08/05/2013  . Atypical chest pain 07/15/2013  . Left ankle pain 06/14/2013  . Rib injury 06/14/2013  . Pain in left ankle w/ effusion 05/24/2013  . Routine general medical examination at a health care facility 04/11/2013  . Type II or unspecified type diabetes mellitus without mention of complication, not stated as uncontrolled 07/26/2012  . Hyperlipidemia 07/26/2012  . 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 ED (erectile dysfunction) 07/26/2012  . Hypogonadism male 07/26/2012   Past Medical History  Diagnosis Date  . Diabetes mellitus without complication   . Hypertension   . Hyperlipidemia   . Complication of anesthesia     has night terrors after anesthesia but can be violent when waking up  . Memory loss of unknown cause     short and long  term. Pt stated "I forget alot of things"  . Frequent urination at night     when blood sugar runs high  . Psoriasis of scalp     nose and face; occurs mainly in winter   . Seasonal allergies   . CAD (coronary artery disease)   . LPFXTKWI(097.3)     Past Surgical History  Procedure Laterality Date  . Hernia repair  2010    abdominal hernia  . Knee arthroscopy  2012    left knee  . Cardiac catheterization      no PCI approx 10 years ago - [place] surgery      right ring finger  . Knee surgery       (Not in a hospital admission) Allergies  Allergen Reactions  . Sulfur Itching    History  Substance Use Topics  . Smoking status: Never Smoker   . Smokeless tobacco: Never Used  . Alcohol Use: No    Family History  Problem Relation Age of Onset  . Diabetes Mother   . Cancer Mother     history breast cancer  . Hypertension Mother   . Cancer Father     prostate  . Alzheimer's disease Father   . Diabetes Father   . Hypertension Father   . Cancer Maternal Uncle     colon  . Colon cancer Maternal Uncle 34  . Cancer Maternal Grandmother     breast  . Cancer Maternal Grandfather     prostate  . Cancer Maternal Uncle     prostate  . Colon cancer Maternal  Uncle 58  . Cancer Maternal Uncle     prostate  . Cancer Cousin 47    metastatic colon cancer?  . Colon polyps Paternal Uncle   . Esophageal cancer Neg Hx      Review of Systems  Constitutional: Negative.   HENT: Positive for congestion. Negative for ear discharge, ear pain, hearing loss, sore throat and tinnitus.   Eyes: Negative.   Respiratory: Negative.   Cardiovascular: Negative.   Gastrointestinal: Negative.   Genitourinary: Negative.   Musculoskeletal: Positive for joint pain.  Skin: Negative.   Neurological: Negative.   Endo/Heme/Allergies: Negative.   Psychiatric/Behavioral: Negative.     Objective:  Physical Exam  Constitutional: He is oriented to person, place, and time. He  appears well-developed and well-nourished.  HENT:  Head: Normocephalic and atraumatic.  Eyes: EOM are normal. Pupils are equal, round, and reactive to light.  Neck: Normal range of motion. Neck supple.  Cardiovascular: Normal rate, regular rhythm and normal heart sounds.  Exam reveals no gallop and no friction rub.   No murmur heard. Respiratory: Effort normal and breath sounds normal. No respiratory distress. He has no wheezes. He has no rales.  GI: Soft. Bowel sounds are normal. He exhibits no distension. There is no tenderness. There is no rebound.  Musculoskeletal:  Varus thrust.  ROM from 0 degrees of extension to 100 degrees of extension.  Ligaments are stable.  Neurovascularly intact distally.  Neurological: He is alert and oriented to person, place, and time.  Skin: Skin is warm and dry.  Psychiatric: He has a normal mood and affect. His behavior is normal. Judgment and thought content normal.    Vital signs in last 24 hours: @VSRANGES @  Labs:   Estimated body mass index is 31.90 kg/(m^2) as calculated from the following:   Height as of 12/06/13: 6\' 4"  (1.93 m).   Weight as of 12/06/13: 118.842 kg (262 lb).   Imaging Review Plain radiographs demonstrate severe degenerative joint disease of the left knee(s). The overall alignment ismild varus. The bone quality appears to be fair for age and reported activity level.  Assessment/Plan:  End stage arthritis, left knee   The patient history, physical examination, clinical judgment of the provider and imaging studies are consistent with end stage degenerative joint disease of the left knee(s) and total knee arthroplasty is deemed medically necessary. The treatment options including medical management, injection therapy arthroscopy and arthroplasty were discussed at length. The risks and benefits of total knee arthroplasty were presented and reviewed. The risks due to aseptic loosening, infection, stiffness, patella tracking problems,  thromboembolic complications and other imponderables were discussed. The patient acknowledged the explanation, agreed to proceed with the plan and consent was signed. Patient is being admitted for inpatient treatment for surgery, pain control, PT, OT, prophylactic antibiotics, VTE prophylaxis, progressive ambulation and ADL's and discharge planning. The patient is planning to be discharged home with home health services

## 2013-12-28 NOTE — Progress Notes (Signed)
Orthopedic Tech Progress Note Patient Details:  Steve [name] 09/18/1961 [id number] On cpm at 7:55 pm LLE 0-60 Patient ID: Steve [name], male   DOB: 09/17/1961, 53 y.o.   MRN: [id number]   Steve [name] 12/28/2013, 7:57 PM

## 2013-12-28 NOTE — Transfer of Care (Signed)
Immediate Anesthesia Transfer of Care Note  Patient: Steve [name]  Procedure(s) Performed: Procedure(s): TOTAL KNEE ARTHROPLASTY (Left)  Patient Location: PACU  Anesthesia Type:General  Level of Consciousness: awake, alert , oriented and patient cooperative  Airway & Oxygen Therapy: Patient Spontanous Breathing and Patient connected to nasal cannula oxygen  Post-op Assessment: Report given to PACU RN and Post -op Vital signs reviewed and stable  Post vital signs: Reviewed and stable  Complications: No apparent anesthesia complications

## 2013-12-28 NOTE — Anesthesia Postprocedure Evaluation (Signed)
  Anesthesia Post-op Note  Patient: Steve [name]  Procedure(s) Performed: Procedure(s): TOTAL KNEE ARTHROPLASTY (Left)  Patient Location: PACU  Anesthesia Type:General  Level of Consciousness: awake, alert  and oriented  Airway and Oxygen Therapy: Patient Spontanous Breathing and Patient connected to nasal cannula oxygen  Post-op Pain: mild  Post-op Assessment: Post-op Vital signs reviewed, Patient's Cardiovascular Status Stable, Respiratory Function Stable, Patent Airway and Pain level controlled  Post-op Vital Signs: stable  Complications: No apparent anesthesia complications

## 2013-12-28 NOTE — Plan of Care (Signed)
Problem: Consults Goal: Diagnosis- Total Joint Replacement Primary Total Knee Left     

## 2013-12-28 NOTE — Addendum Note (Signed)
Addendum created 12/28/13 1849 by Izora Gala, CRNA   Modules edited: Anesthesia Medication Administration

## 2013-12-29 LAB — CBC
HCT: 36.1 % — ABNORMAL LOW (ref 39.0–52.0)
Hemoglobin: 12.5 g/dL — ABNORMAL LOW (ref 13.0–17.0)
MCH: 28.5 pg (ref 26.0–34.0)
MCHC: 34.6 g/dL (ref 30.0–36.0)
MCV: 82.2 fL (ref 78.0–100.0)
PLATELETS: 361 10*3/uL (ref 150–400)
RBC: 4.39 MIL/uL (ref 4.22–5.81)
RDW: 13.1 % (ref 11.5–15.5)
WBC: 8 10*3/uL (ref 4.0–10.5)

## 2013-12-29 LAB — GLUCOSE, CAPILLARY
GLUCOSE-CAPILLARY: 202 mg/dL — AB (ref 70–99)
GLUCOSE-CAPILLARY: 196 mg/dL — AB (ref 70–99)
GLUCOSE-CAPILLARY: 281 mg/dL — AB (ref 70–99)
Glucose-Capillary: 292 mg/dL — ABNORMAL HIGH (ref 70–99)

## 2013-12-29 LAB — BASIC METABOLIC PANEL
BUN: 8 mg/dL (ref 6–23)
CALCIUM: 8.5 mg/dL (ref 8.4–10.5)
CO2: 21 mEq/L (ref 19–32)
Chloride: 94 mEq/L — ABNORMAL LOW (ref 96–112)
Creatinine, Ser: 0.8 mg/dL (ref 0.50–1.35)
GFR calc Af Amer: >90 mL/min (ref >90)
Glucose, Bld: 278 mg/dL — ABNORMAL HIGH (ref 70–99)
Potassium: 4 mEq/L (ref 3.7–5.3)
SODIUM: 134 meq/L — AB (ref 137–147)

## 2013-12-29 MED ORDER — ONDANSETRON HCL 4 MG PO TABS: 4.0000 mg | ORAL_TABLET | Freq: Three times a day (TID) | ORAL | Status: DC | PRN

## 2013-12-29 MED ORDER — BISACODYL 5 MG PO TBEC: 5.0000 mg | DELAYED_RELEASE_TABLET | Freq: Every day | ORAL | Status: DC | PRN

## 2013-12-29 MED ORDER — ASPIRIN EC 325 MG PO TBEC: 325.0000 mg | DELAYED_RELEASE_TABLET | Freq: Every day | ORAL | Status: DC

## 2013-12-29 MED ORDER — OXYCODONE-ACETAMINOPHEN 5-325 MG PO TABS: 1.0000 | ORAL_TABLET | ORAL | Status: DC | PRN

## 2013-12-29 MED ORDER — METHOCARBAMOL 500 MG PO TABS: 500.0000 mg | ORAL_TABLET | Freq: Four times a day (QID) | ORAL | Status: DC

## 2013-12-29 NOTE — Op Note (Signed)
NAMEMarland [name]  AGUSTINE, ROSSITTO NO.:  [id number]  MEDICAL RECORD NO.:  [id number]  LOCATION:  5N18C                        FACILITY:  [facility]  PHYSICIAN:  Ninetta Lights, M.D. DATE OF BIRTH:  August 28, 1961  DATE OF PROCEDURE:  12/28/2013 DATE OF DISCHARGE:                              OPERATIVE REPORT   PREOPERATIVE DIAGNOSIS:  Left knee end-stage degenerative arthritis, varus alignment.  POSTOPERATIVE DIAGNOSIS:  Left knee end-stage degenerative arthritis, varus alignment.  PROCEDURE:  Left knee modified minimally invasive total knee replacement Stryker triathlon prosthesis.  Soft tissue balancing.  Cemented pegged posterior stabilized #7 femoral component.  Cemented #7 tibial component, 9-mm polyethylene insert.  Resurfacing cemented pegged medial offset 38 mm patellar component.  SURGEON:  Ninetta Lights, M.D.  ASSISTANT:  Eula Listen P.A. present throughout the entire case and necessary for timely completion of procedure.  ANESTHESIA:  General.  BLOOD LOSS:  Minimal.  SPECIMENS:  None.  CULTURES:  None.  COMPLICATIONS:  None.  DRESSINGS:  Soft compressive with knee immobilizer.  DRAINS:  Hemovac x1.  TOURNIQUET TIME:  1 hour.  PROCEDURE:  The patient was brought to the operating room, placed on the operating table in supine position.  After adequate anesthesia had been obtained, tourniquet applied.  Prepped and draped in usual sterile fashion.  Exsanguinated with elevation of Esmarch, tourniquet inflated to 350 mmHg.  Straight incision above the patella down to tibial tubercle.  Skin and subcutaneous tissue divided.  Hemostasis with cautery.  Medial arthrotomy, vastus splitting, preserving quad tendon. Medial capsule release.  Knee exposed.  No significant changes medially. Remnants of menisci, periarticular spurs, loose bodies removed. Intramedullary guide, distal femur, flexible rod.  An 8 mm resection, 5 degrees of valgus.  Using epicondylar  axis, the femur was sized, cut, and fitted for a posterior stabilized pegged #7 component.  Proximal tibial resection, extramedullary guide.  Size #7 component.  Debris cleared throughout the knee.  Nicely balanced, flexion and extension. Patella exposed, posterior 10 mm removed.  Drilled, sized, and fitted for a 38-mm component.  Trials put in place.  #7 above and below 9 mm insert, 38 patella.  With this construct, I was very pleased with biomechanical axis, motion, stability and balancing.  Tibia was marked for rotation and reamed.  All trials were removed.  Copious irrigation with a pulse irrigating device.  Cement prepared, placed on all components, firmly seated.  Polyethylene attached to tibia, knee reduced.  Patella with a clamp.  Once cement hardened, the knee was irrigated again.  Soft tissues injected with Exparel.  Hemovac was placed and brought out through a separate stab wound.  Arthrotomy closed with #1 Vicryl, subcutaneous and subcuticular closure.  Margins were injected with Marcaine.  Sterile compressive dressing applied. Tourniquet deflated and removed.  Knee immobilizer applied.  Anesthesia reversed.  Brought to the recovery room.  Tolerated the surgery well. No complications.     Ninetta Lights, M.D.     DFM/MEDQ  D:  12/28/2013  T:  12/29/2013  Job:  [id number]

## 2013-12-29 NOTE — Progress Notes (Signed)
Utilization review completed.  

## 2013-12-29 NOTE — Care Management Note (Signed)
CARE MANAGEMENT NOTE 12/29/2013  Patient:  Steve [name], Steve [name]   Account Number:  [id number]  Date Initiated:  12/29/2013  Documentation initiated by:  Ricki Miller  Subjective/Objective Assessment:   53 yr old male s/p left total knee arthroplasty.     Action/Plan:   Case manager spoke with patient concerning home health and DME needs at discharge. Patient preoperatively setup with [facility], no changes. DME has been delivered.   Anticipated DC Date:  12/30/2013   Anticipated DC Plan:  [place]  CM consult      [facility]  HOME HEALTH  DURABLE MEDICAL EQUIPMENT   Choice offered to / List presented to:  C-1 Patient   DME arranged  3-N-1  [facility]  CPM      DME agency  [employer]     [place] arranged  HH-2 PT      [place] agency  [facility]   Status of service:  Completed, signed off Medicare Important Message given?   (If response is "NO", the following Medicare IM given date fields will be blank) Date Medicare IM given:   Date Additional Medicare IM given:    Discharge Disposition:  [place]

## 2013-12-29 NOTE — Discharge Summary (Signed)
Patient ID: Steve [name] MRN: [id number] DOB/AGE: 1961-01-21 53 y.o.  Admit date: 12/28/2013 Discharge date: 12/30/2013  Admission Diagnoses:  Active Problems:   DJD (degenerative joint disease) of knee   Discharge Diagnoses:  Same  Past Medical History  Diagnosis Date  . Diabetes mellitus without complication   . Hypertension   . Hyperlipidemia   . Complication of anesthesia     has night terrors after anesthesia but can be violent when waking up  . 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 Pt stated "I forget alot of things"  . Frequent urination at night     when blood sugar runs high  . Psoriasis of scalp     nose and face; occurs mainly in winter   . Seasonal allergies   . CAD (coronary artery disease)   . Headache(784.0)   . Sinus infection 12/16/13  . ADHD (attention deficit hyperactivity disorder)     Surgeries: Procedure(s): TOTAL KNEE ARTHROPLASTY on 12/28/2013   Consultants:    Discharged Condition: Improved  Hospital Course: Steve [name] is an 54 y.o. male who was admitted 12/28/2013 for operative treatment of<principal problem not specified>. Patient has severe unremitting pain that affects sleep, daily activities, and work/hobbies. After pre-op clearance the patient was taken to the operating room on 12/28/2013 and underwent  Procedure(s): TOTAL KNEE ARTHROPLASTY.    Patient was given perioperative antibiotics:     Anti-infectives   Start     Dose/Rate Route Frequency Ordered Stop   12/28/13 2200  amoxicillin (AMOXIL) capsule 500 mg     500 mg Oral Every 12 hours 12/28/13 1726     12/28/13 2100  ceFAZolin (ANCEF) IVPB 2 g/50 mL premix     2 g 100 mL/hr over 30 Minutes Intravenous Every 6 hours 12/28/13 1726 12/29/13 0407   12/28/13 0600  ceFAZolin (ANCEF) IVPB 2 g/50 mL premix     2 g 100 mL/hr over 30 Minutes Intravenous On call to O.R. 12/27/13 1423 12/28/13 1355       Patient was given sequential compression devices, early  ambulation, and chemoprophylaxis to prevent DVT.  Patient benefited maximally from hospital stay and there were no complications.    Recent vital signs:  Patient Vitals for the past 24 hrs:  BP Temp Temp src Pulse Resp SpO2  12/30/13 0629 129/68 mmHg 98.9 F (37.2 C) Oral 99 18 95 %  12/29/13 1944 136/84 mmHg 98.8 F (37.1 C) Oral 103 18 98 %  12/29/13 1400 159/76 mmHg 98.2 F (36.8 C) - 110 18 96 %  12/29/13 1200 - - - - 16 97 %     Recent laboratory studies:   Recent Labs  12/29/13 0652 12/30/13 0422  WBC 8.0 5.7  HGB 12.5* 10.5*  HCT 36.1* 30.6*  PLT 361 233  NA 134* 137  K 4.0 3.7  CL 94* 98  CO2 21 26  BUN 8 7  CREATININE 0.80 0.73  GLUCOSE 278* 236*  CALCIUM 8.5 8.2*     Discharge Medications:     Medication List         albuterol 108 (90 BASE) MCG/ACT inhaler  Commonly known as:  PROVENTIL HFA;VENTOLIN HFA  Inhale 2 puffs into the lungs every 6 (six) hours as needed for wheezing or shortness of breath.     amoxicillin 500 MG capsule  Commonly known as:  AMOXIL  Take 500 mg by mouth every 12 (twelve) hours. 30 day supply. Started  12/16/13     aspirin EC 325 MG tablet  Take 1 tablet (325 mg total) by mouth daily.     benzonatate 100 MG capsule  Commonly known as:  TESSALON  Take 1 capsule (100 mg total) by mouth 3 (three) times daily as needed for cough.     bisacodyl 5 MG EC tablet  Commonly known as:  DULCOLAX  Take 1 tablet (5 mg total) by mouth daily as needed for moderate constipation.     fluticasone 50 MCG/ACT nasal spray  Commonly known as:  FLONASE  Place 2 sprays into both nostrils daily.     insulin glargine 100 UNIT/ML injection  Commonly known as:  LANTUS  Inject 0.35 mLs (35 Units total) into the skin at bedtime. Sliding scale     lisinopril 2.5 MG tablet  Commonly known as:  PRINIVIL,ZESTRIL  Take 2.5 mg by mouth daily.     metFORMIN 1000 MG tablet  Commonly known as:  GLUCOPHAGE  Take 1 tablet (1,000 mg total) by mouth 2  (two) times daily with a meal.     methocarbamol 500 MG tablet  Commonly known as:  ROBAXIN  Take 1 tablet (500 mg total) by mouth 4 (four) times daily.     NOVOLOG FLEXPEN 100 UNIT/ML FlexPen  Generic drug:  insulin aspart  - Inject 0-12 Units into the skin 3 (three) times daily with meals. sliding scale- check sugar and inject 3 times daily before meals as below:  -   - <150-   Zero units  - 150-200 2 units  - 201-250 4 units  - 251-300 6 units  - 301-350 8 units  - 351-400 10 units  - >400             12 units and contact us.     ondansetron 4 MG tablet  Commonly known as:  ZOFRAN  Take 1 tablet (4 mg total) by mouth every 8 (eight) hours as needed for nausea or vomiting.     oxyCODONE-acetaminophen 5-325 MG per tablet  Commonly known as:  ROXICET  Take 1-2 tablets by mouth every 4 (four) hours as needed.     JANUVIA 100 MG tablet  Generic drug:  sitaGLIPtin  TAKE 1 TABLET BY MOUTH DAILY.     sitaGLIPtin 100 MG tablet  Commonly known as:  JANUVIA  Take 100 mg by mouth daily.     ZINC PO  Take 2 tablets by mouth every morning.        Diagnostic Studies: Dg Chest 2 View  12/21/2013   CLINICAL DATA Cough  EXAM CHEST  2 VIEW  COMPARISON December 09, 2013  FINDINGS The lungs are clear. The heart size and pulmonary vascularity are normal. No adenopathy. There is degenerative change in the lower thoracic spine.  IMPRESSION No edema or consolidation.  SIGNATURE  Electronically Signed   By: Lowella Grip M.D.   On: 12/21/2013 09:01   Dg Chest 2 View  12/09/2013   CLINICAL DATA:  53 year old male flu symptoms, cough, fever. Symptoms increasing. Initial encounter.  EXAM: CHEST  2 VIEW  COMPARISON:  08/05/2013.  FINDINGS: Stable lung volumes with mild elevation of the right hemidiaphragm. Normal cardiac size and mediastinal contours. Visualized tracheal air column is within normal limits. No pneumothorax, pulmonary edema, pleural effusion or consolidation. No confluent  or acute pulmonary opacity identified. No acute osseous abnormality identified.  IMPRESSION: No acute cardiopulmonary abnormality.   Electronically Signed   By: Lezlie Octave.D.  On: 12/09/2013 11:03   Dg Knee Left Port  12/28/2013   CLINICAL DATA:  post-op  EXAM: PORTABLE LEFT KNEE - 1-2 VIEW  COMPARISON:  None.  FINDINGS: Patient is status post total left knee arthroplasty. The hardware and osseous structures appear intact. Postsurgical changes and surgical drain are identified within the soft tissues of the knee.  IMPRESSION: Patient is status post total left knee arthroplasty.   Electronically Signed   By: Margaree Mackintosh M.D.   On: 12/28/2013 17:22    Disposition: 01-Home or Self Care  Discharge Orders   Future Appointments Provider Department Dept Phone   01/11/2014 2:00 PM [place] [phone]   01/25/2014 10:00 AM Milus Banister, [place] [phone]   01/30/2014 2:15 PM Debbrah Alar, NP [place] at  [place]   Future Orders Complete By Expires   Call MD / Call 911  As directed    Comments:     If you experience chest pain or shortness of breath, CALL 911 and be transported to the hospital emergency room.  If you develope a fever above 101 F, pus (white drainage) or increased drainage or redness at the wound, or calf pain, call your surgeon's office.   Change dressing  As directed    Comments:     Change dressing on Saturday, then change the dressing daily with sterile 4 x 4 inch gauze dressing and apply TED hose.  You may clean the incision with alcohol prior to redressing.   Constipation Prevention  As directed    Comments:     Drink plenty of fluids.  Prune juice may be helpful.  You may use a stool softener, such as Colace (over the counter) 100 mg twice a day.  Use MiraLax (over the counter) for constipation as needed.   CPM  As directed    Comments:     Continuous passive  motion machine (CPM):      Use the CPM from 0- to 60 for 6 hours per day.      You may increase by 10 per day.  You may break it up into 2 or 3 sessions per day.      Use CPM for 3-4 weeks or until you are told to stop.   Diet - low sodium heart healthy  As directed    Discharge instructions  As directed    Comments:     Weight bearing as tolerated.  Take Aspirin 1 tab a day for the next 30 days to prevent blood clots.  May change dressing daily starting on Saturday.  May shower on Monday, but do not soak incision.  May apply ice for up to 20 minutes at a time for pain and swelling.  Follow up appointment in our office in two weeks.   Do not put a pillow under the knee. Place it under the heel.  As directed    Comments:     Place gray foam under operative heel when in bed or in a chair to work on extension   Increase activity slowly as tolerated  As directed    TED hose  As directed    Comments:     Use stockings (TED hose) for 2 weeks on both leg(s).  You may remove them at night for sleeping.      Follow-up Information   Follow up with [facility], MD In 2 weeks.   Specialty:  Orthopedic Surgery  Contact information:   [place] [address] [phone]       Follow up with [facility]. (Someone from [facility] will contact you with Physical therapy start of care date and time )    Contact information:   [address] [place] [address] [phone]        Signed: Larae Grooms 12/30/2013, 8:26 AM

## 2013-12-29 NOTE — Progress Notes (Signed)
Physical Therapy Treatment Patient Details Name: TRANELL WOJTKIEWICZ MRN: [id number] DOB: 1961/04/04 Today's Date: 12/29/2013 Time: 5643-3295 PT Time Calculation (min): 35 min  PT Assessment / Plan / Recommendation  History of Present Illness s/p elective Lt TKA   PT Comments   Pt is s/p TKA resulting in the deficits listed below (see PT Problem List). Pt tolerated treatment well with great motivation to increase functional mobility and L LE strength. Pt will benefit from skilled PT to increase their independence and safety with mobility to allow discharge to the venue listed below.    Follow Up Recommendations  Home health PT;Supervision for mobility/OOB     Does the patient have the potential to tolerate intense rehabilitation     Barriers to Discharge        Equipment Recommendations  Rolling walker with 5" wheels;3in1 (PT)    Recommendations for Other Services OT consult  Frequency 7X/week   Progress towards PT Goals Progress towards PT goals: Progressing toward goals  Plan Current plan remains appropriate    Precautions / Restrictions Precautions Precautions: Fall;Knee Precaution Booklet Issued: Yes (comment) Precaution Comments: pt given TKA exercise handout Required Braces or Orthoses: Knee Immobilizer - Left Knee Immobilizer - Left: On when out of bed or walking Restrictions Weight Bearing Restrictions: Yes LLE Weight Bearing: Weight bearing as tolerated   Pertinent Vitals/Pain Pt reports decreased pain compared to this morning's session    Mobility  Bed Mobility Overal bed mobility: Needs Assistance Bed Mobility: Supine to Sit;Sit to Supine Supine to sit: Min guard;HOB elevated Sit to supine: Min guard General bed mobility comments: cues for sequencing and safety Transfers Overall transfer level: Needs assistance Equipment used: Rolling walker (2 wheeled) Transfers: Sit to/from Stand Sit to Stand: Min guard General transfer comment: cues for hand placement and  sequencing with RW; pt slow to WB through Lt LE; min guard to steady Ambulation/Gait Ambulation/Gait assistance: Min guard Ambulation Distance (Feet): 250 Feet Assistive device: Rolling walker (2 wheeled) Gait Pattern/deviations: Step-to pattern;Decreased stride length;Antalgic;Decreased weight shift to left Gait velocity: slow guarded for pain General Gait Details: cues for RW safety and step sequency. pt attempted to WB more this afternoon than this morn. WB limited to pain    Exercises Total Joint Exercises Ankle Circles/Pumps: 10 reps;Seated;AROM;Strengthening;Left Quad Sets: AROM;Strengthening;10 reps;Seated;Left Heel Slides: AAROM;Strengthening;10 reps;Left;Seated   PT Diagnosis:    PT Problem List:   PT Treatment Interventions:     PT Goals (current goals can now be found in the care plan section) Acute Rehab PT Goals Patient Stated Goal: to go home today PT Goal Formulation: With patient Time For Goal Achievement: 01/12/14 Potential to Achieve Goals: Good  Visit Information  Last PT Received On: 12/29/13 Assistance Needed: +1 History of Present Illness: s/p elective Lt TKA    Subjective Data  Patient Stated Goal: to go home today   Cognition  Cognition Arousal/Alertness: Awake/alert Behavior During Therapy: WFL for tasks assessed/performed Overall Cognitive Status: Within Functional Limits for tasks assessed    Balance  Balance Overall balance assessment: Needs assistance Sitting-balance support: No upper extremity supported Sitting balance-Leahy Scale: Normal Standing balance support: Bilateral upper extremity supported Standing balance-Leahy Scale: Poor Standing balance comment: pt req RW for stability and to limit WB on L LE  End of Session PT - End of Session Equipment Utilized During Treatment: Gait belt;Left knee immobilizer (RW) Activity Tolerance: Patient tolerated treatment well Patient left: in bed;with call bell/phone within reach Nurse  Communication: Mobility status  GP     Brittin Belnap SPT 12/29/2013, 2:32 PM

## 2013-12-29 NOTE — Discharge Instructions (Signed)
Total Knee Replacement Care After Refer to this sheet in the next few weeks. These instructions provide you with information on caring for yourself after your procedure. Your caregiver also may give you specific instructions. Your treatment has been planned according to the most current medical practices, but problems sometimes occur. Call your caregiver if you have any problems or questions after your procedure. HOME CARE INSTRUCTIONS  Weight bearing as tolerated.  Take Aspirin 1 tab a day for the next 30 days to prevent blood clots.  May change dressing daily starting on Saturday.  May shower on Monday, but do not soak incision.  May use ice for up to 20 minutes at a time for pain and swelling.  Follow up appointment in our office in two weeks.  See a physical therapist as directed by your caregiver.  Take over-the-counter or prescription medicines for pain, discomfort, or fever only as directed by your caregiver.  Avoid lifting or driving until you are instructed otherwise.  If you have been sent home with a continuous passive motion machine, use it as directed by your caregiver. SEEK MEDICAL CARE IF:  You have difficulty breathing.  Your wound is red, swollen, or has become increasingly painful.  You have pus draining from your wound.  You have a bad smell coming from your wound.  You have persistent bleeding from your wound.  Your wound breaks open after sutures (stitches) or staples have been removed. SEEK IMMEDIATE MEDICAL CARE IF:   You have a fever.  You have a rash.  You have pain or swelling in your calf or thigh.  You have shortness of breath or chest pain.  Your range of motion in your knee is decreasing rather than increasing. MAKE SURE YOU:   Understand these instructions.  Will watch your condition.  Will get help right away if you are not doing well or get worse. Document Released: 04/18/2005 Document Revised: 03/30/2012 Document Reviewed:  11/18/2011 [facility] Patient Information 2014 [place].

## 2013-12-29 NOTE — Progress Notes (Signed)
Subjective: 1 Day Post-Op Procedure(s) (LRB): TOTAL KNEE ARTHROPLASTY (Left) Patient reports pain as 3 on 0-10 scale.  Patient admits to some pain but nothing intolerable.  No nausea/vomiting, lightheadedness/dizziness.  No flatus and no bm as of yet.  Reports insomnia.  Objective: Vital signs in last 24 hours: Temp:  [97.7 F (36.5 C)-99.6 F (37.6 C)] 99.6 F (37.6 C) (03/19 0432) Pulse Rate:  [77-107] 100 (03/19 0432) Resp:  [8-20] 16 (03/19 0432) BP: (151-174)/(81-96) 155/85 mmHg (03/19 0432) SpO2:  [92 %-100 %] 95 % (03/19 0432)  Intake/Output from previous day: 03/18 0701 - 03/19 0700 In: 1240 [P.O.:240; I.V.:1000] Out: 1100 [Urine:900; Drains:200] Intake/Output this shift: Total I/O In: 240 [P.O.:240] Out: 1000 [Urine:900; Drains:100]  No results found for this basename: HGB,  in the last 72 hours No results found for this basename: WBC, RBC, HCT, PLT,  in the last 72 hours No results found for this basename: NA, K, CL, CO2, BUN, CREATININE, GLUCOSE, CALCIUM,  in the last 72 hours No results found for this basename: LABPT, INR,  in the last 72 hours  Neurologically intact ABD soft Neurovascular intact Sensation intact distally Intact pulses distally Dorsiflexion/Plantar flexion intact Compartment soft No drainage through ace bandage hemovac drain pulled by me today  Assessment/Plan: 1 Day Post-Op Procedure(s) (LRB): TOTAL KNEE ARTHROPLASTY (Left) Advance diet Up with therapy D/C IV fluids Plan for discharge tomorrow Discharge home with home health  Steve [name], Steve [name] 12/29/2013, 6:14 AM

## 2013-12-29 NOTE — Evaluation (Signed)
Physical Therapy Evaluation Patient Details Name: Steve [name] MRN: [id number] DOB: Dec 13, 1960 Today's Date: 12/29/2013 Time: 2683-4196 PT Time Calculation (min): 32 min  PT Assessment / Plan / Recommendation History of Present Illness  s/p elective Lt TKA  Clinical Impression  Pt is s/p Lt TKA resulting in the deficits listed below (see PT Problem List).  Pt will benefit from skilled PT to increase their independence and safety with mobility to allow discharge to the venue listed below. Pt highly motivated to D/C home as soon as possible.      PT Assessment  Patient needs continued PT services    Follow Up Recommendations  Home health PT;Supervision for mobility/OOB    Does the patient have the potential to tolerate intense rehabilitation      Barriers to Discharge        Equipment Recommendations  Rolling walker with 5" wheels;3in1 (PT)    Recommendations for Other Services OT consult   Frequency 7X/week    Precautions / Restrictions Precautions Precautions: Fall;Knee Precaution Booklet Issued: Yes (comment) Precaution Comments: pt given TKA exercise handout Required Braces or Orthoses: Knee Immobilizer - Left Knee Immobilizer - Left: On when out of bed or walking Restrictions Weight Bearing Restrictions: Yes LLE Weight Bearing: Weight bearing as tolerated   Pertinent Vitals/Pain 6/10 pre activity; 7/10 after activity; pt premedicated by RN      Mobility  Bed Mobility Overal bed mobility: Needs Assistance Bed Mobility: Supine to Sit Supine to sit: Min assist;HOB elevated General bed mobility comments: (A) to advance Lt LE to/off EOB; cues for sequencing and handrails used  Transfers Overall transfer level: Needs assistance Equipment used: Rolling walker (2 wheeled) Transfers: Sit to/from Stand Sit to Stand: Min guard General transfer comment: cues for hand placement and sequencing with RW; pt fearful to WB through Lt LE; min guard to  steady Ambulation/Gait Ambulation/Gait assistance: Min guard;Supervision Ambulation Distance (Feet): 130 Feet Assistive device: Rolling walker (2 wheeled) Gait Pattern/deviations: Antalgic;Decreased stance time - left;Decreased step length - right;Trunk flexed Gait velocity: decreased  Gait velocity interpretation: Below normal speed for age/gender General Gait Details: cues for proper gt sequencing; pt with decr ability to WB through Lt LE due to pain and fear; cues for upright posture    Exercises Total Joint Exercises Ankle Circles/Pumps: AROM;Both;15 reps;Seated Quad Sets: AROM;10 reps;Seated;Left Heel Slides: AAROM;Left;10 reps;Seated;Other (comment) (limited by pain ) Goniometric ROM: lacking 12 degrees extension; 48degrees flexion AAROM in sitting   PT Diagnosis: Abnormality of gait;Acute pain  PT Problem List: Decreased strength;Decreased range of motion;Decreased activity tolerance;Decreased balance;Decreased mobility;Decreased knowledge of use of DME;Decreased knowledge of precautions;Pain PT Treatment Interventions: DME instruction;Gait training;Functional mobility training;Therapeutic activities;Therapeutic exercise;Balance training;Neuromuscular re-education;Patient/family education     PT Goals(Current goals can be found in the care plan section) Acute Rehab PT Goals Patient Stated Goal: to go home today PT Goal Formulation: With patient Time For Goal Achievement: 01/12/14 Potential to Achieve Goals: Good  Visit Information  Last PT Received On: 12/29/13 Assistance Needed: +1 History of Present Illness: s/p elective Lt TKA       Prior Functioning  Home Living Family/patient expects to be discharged to:: Private residence Living Arrangements: Spouse/significant other Available Help at Discharge: Family;Available 24 hours/day Type of Home: Mobile home Home Access: Ramped entrance Home Layout: One level Home Equipment: [place] - 2 wheels;Bedside commode;Shower  seat - built in Prior Function Level of Independence: Independent Communication Communication: No difficulties    Cognition  Cognition Arousal/Alertness: Awake/alert Behavior During Therapy:  WFL for tasks assessed/performed Overall Cognitive Status: Within Functional Limits for tasks assessed    Extremity/Trunk Assessment Upper Extremity Assessment Upper Extremity Assessment: Overall WFL for tasks assessed Lower Extremity Assessment Lower Extremity Assessment: LLE deficits/detail LLE Deficits / Details: Limited by pain and surgery; ACE wrapped up to quad-limiting ROM; strength grossly 3-/5 LLE: Unable to fully assess due to pain Cervical / Trunk Assessment Cervical / Trunk Assessment: Normal   Balance Balance Overall balance assessment: Needs assistance Sitting-balance support: Feet supported;No upper extremity supported Sitting balance-Leahy Scale: Normal Standing balance support: During functional activity;Bilateral upper extremity supported Standing balance-Leahy Scale: Poor Standing balance comment: UE supported; pt maintains NWB on Lt LE due to pain General Comments General comments (skin integrity, edema, etc.): blood noticed on outside of ACE wrap; RN notified   End of Session PT - End of Session Equipment Utilized During Treatment: Gait belt;Left knee immobilizer Activity Tolerance: Patient tolerated treatment well Patient left: in chair;with call bell/phone within reach Nurse Communication: Mobility status CPM Left Knee CPM Left Knee: [address]  GP     Elie Confer [place], [place] 12/29/2013, 9:17 AM

## 2013-12-29 NOTE — Progress Notes (Signed)
Physical Therapy Treatment Patient Details Name: Steve [name] MRN: [id number] DOB: 1961/04/04 Today's Date: 12/29/2013 Time: 5643-3295 PT Time Calculation (min): 35 min  PT Assessment / Plan / Recommendation  History of Present Illness s/p elective Lt TKA   PT Comments   Pt is s/p TKA resulting in the deficits listed below (see PT Problem List). Pt tolerated treatment well with great motivation to increase functional mobility and L LE strength. Pt will benefit from skilled PT to increase their independence and safety with mobility to allow discharge to the venue listed below.    Follow Up Recommendations  Home health PT;Supervision for mobility/OOB     Does the patient have the potential to tolerate intense rehabilitation     Barriers to Discharge        Equipment Recommendations  Rolling walker with 5" wheels;3in1 (PT)    Recommendations for Other Services OT consult  Frequency 7X/week   Progress towards PT Goals Progress towards PT goals: Progressing toward goals  Plan Current plan remains appropriate    Precautions / Restrictions Precautions Precautions: Fall;Knee Precaution Booklet Issued: Yes (comment) Precaution Comments: pt given TKA exercise handout Required Braces or Orthoses: Knee Immobilizer - Left Knee Immobilizer - Left: On when out of bed or walking Restrictions Weight Bearing Restrictions: Yes LLE Weight Bearing: Weight bearing as tolerated   Pertinent Vitals/Pain Pt reports decreased pain compared to this morning's session    Mobility  Bed Mobility Overal bed mobility: Needs Assistance Bed Mobility: Supine to Sit;Sit to Supine Supine to sit: Min guard;HOB elevated Sit to supine: Min guard General bed mobility comments: cues for sequencing and safety Transfers Overall transfer level: Needs assistance Equipment used: Rolling walker (2 wheeled) Transfers: Sit to/from Stand Sit to Stand: Min guard General transfer comment: cues for hand placement and  sequencing with RW; pt slow to WB through Lt LE; min guard to steady Ambulation/Gait Ambulation/Gait assistance: Min guard Ambulation Distance (Feet): 250 Feet Assistive device: Rolling walker (2 wheeled) Gait Pattern/deviations: Step-to pattern;Decreased stride length;Antalgic;Decreased weight shift to left Gait velocity: slow guarded for pain General Gait Details: cues for RW safety and step sequency. pt attempted to WB more this afternoon than this morn. WB limited to pain    Exercises Total Joint Exercises Ankle Circles/Pumps: 10 reps;Seated;AROM;Strengthening;Left Quad Sets: AROM;Strengthening;10 reps;Seated;Left Heel Slides: AAROM;Strengthening;10 reps;Left;Seated   PT Diagnosis:    PT Problem List:   PT Treatment Interventions:     PT Goals (current goals can now be found in the care plan section) Acute Rehab PT Goals Patient Stated Goal: to go home today PT Goal Formulation: With patient Time For Goal Achievement: 01/12/14 Potential to Achieve Goals: Good  Visit Information  Last PT Received On: 12/29/13 Assistance Needed: +1 History of Present Illness: s/p elective Lt TKA    Subjective Data  Patient Stated Goal: to go home today   Cognition  Cognition Arousal/Alertness: Awake/alert Behavior During Therapy: WFL for tasks assessed/performed Overall Cognitive Status: Within Functional Limits for tasks assessed    Balance  Balance Overall balance assessment: Needs assistance Sitting-balance support: No upper extremity supported Sitting balance-Leahy Scale: Normal Standing balance support: Bilateral upper extremity supported Standing balance-Leahy Scale: Poor Standing balance comment: pt req RW for stability and to limit WB on L LE  End of Session PT - End of Session Equipment Utilized During Treatment: Gait belt;Left knee immobilizer (RW) Activity Tolerance: Patient tolerated treatment well Patient left: in bed;with call bell/phone within reach Nurse  Communication: Mobility status  GP    Manley Mason, [place], [place] [phone] 12/29/2013, 3:58 PM

## 2013-12-30 ENCOUNTER — Encounter (HOSPITAL_COMMUNITY): Payer: Self-pay | Admitting: Orthopedic Surgery

## 2013-12-30 LAB — CBC
HCT: 30.6 % — ABNORMAL LOW (ref 39.0–52.0)
HEMOGLOBIN: 10.5 g/dL — AB (ref 13.0–17.0)
MCH: 28.1 pg (ref 26.0–34.0)
MCHC: 34.3 g/dL (ref 30.0–36.0)
MCV: 81.8 fL (ref 78.0–100.0)
PLATELETS: 233 10*3/uL (ref 150–400)
RBC: 3.74 MIL/uL — ABNORMAL LOW (ref 4.22–5.81)
RDW: 13.3 % (ref 11.5–15.5)
WBC: 5.7 10*3/uL (ref 4.0–10.5)

## 2013-12-30 LAB — GLUCOSE, CAPILLARY
GLUCOSE-CAPILLARY: 217 mg/dL — AB (ref 70–99)
Glucose-Capillary: 232 mg/dL — ABNORMAL HIGH (ref 70–99)

## 2013-12-30 LAB — BASIC METABOLIC PANEL
BUN: 7 mg/dL (ref 6–23)
CHLORIDE: 98 meq/L (ref 96–112)
CO2: 26 meq/L (ref 19–32)
Calcium: 8.2 mg/dL — ABNORMAL LOW (ref 8.4–10.5)
Creatinine, Ser: 0.73 mg/dL (ref 0.50–1.35)
GFR calc Af Amer: >90 mL/min (ref >90)
GFR calc non Af Amer: >90 mL/min (ref >90)
GLUCOSE: 236 mg/dL — AB (ref 70–99)
Potassium: 3.7 mEq/L (ref 3.7–5.3)
SODIUM: 137 meq/L (ref 137–147)

## 2013-12-30 NOTE — Progress Notes (Signed)
OT Cancellation and Discharge Note  Patient Details Name: Steve [name] MRN: [id number] DOB: 1961/07/23   Cancelled Treatment:    Reason Eval/Treat Not Completed: OT screened, no needs identified, will sign off. Pt educated briefly on LB dressing technique and walk-in shower transfer. Pt reports that wife will be home 24/7 to assist with ADLs and has no concerns. Acute OT to sign off.   Juluis Rainier [phone] 12/30/2013, 12:34 PM

## 2013-12-30 NOTE — Progress Notes (Signed)
Physical Therapy Treatment Patient Details Name: Steve [name] MRN: [id number] DOB: 1961-02-27 Today's Date: 12/30/2013 Time: 8119-1478 PT Time Calculation (min): 39 min  PT Assessment / Plan / Recommendation  History of Present Illness s/p elective Lt TKA   PT Comments   Pt. Making good strides with mobility, needs more work with ROM and strengthening in L knee.  Pt. Is very motivated.  I encouraged him to continue to stress knee ROM/strength.  Wife present, supportive.  Pt. And wife both feel pt. Is ready for DC.  Anticipate DC later today.  Follow Up Recommendations  Home health PT;Supervision for mobility/OOB     Does the patient have the potential to tolerate intense rehabilitation     Barriers to Discharge        Equipment Recommendations  Rolling walker with 5" wheels;3in1 (PT)    Recommendations for Other Services    Frequency 7X/week   Progress towards PT Goals Progress towards PT goals: Progressing toward goals  Plan Current plan remains appropriate    Precautions / Restrictions Precautions Precautions: Fall;Knee Required Braces or Orthoses: Knee Immobilizer - Left Knee Immobilizer - Left: On when out of bed or walking Restrictions Weight Bearing Restrictions: Yes LLE Weight Bearing: Weight bearing as tolerated   Pertinent Vitals/Pain See vitals tab     Mobility  Bed Mobility Overal bed mobility: Needs Assistance Bed Mobility: Supine to Sit;Sit to Supine Supine to sit: HOB elevated;Supervision Sit to supine: Supervision General bed mobility comments: pt. managing his own LE today without physical assist from therapist Transfers Overall transfer level: Modified independent Equipment used: Rolling walker (2 wheeled) Transfers: Sit to/from Stand Sit to Stand: Supervision General transfer comment: pt. needed reminder for hand placment and safe technique Ambulation/Gait Ambulation/Gait assistance: Modified independent (Device/Increase time) Ambulation  Distance (Feet): 500 Feet Assistive device: Rolling walker (2 wheeled) Gait Pattern/deviations: Step-to pattern Gait velocity: decreased Gait velocity interpretation: Below normal speed for age/gender General Gait Details: Pt. with ongoing work to increase weight bearing L LE and to work toward step through pattern Stairs: Yes Stairs assistance: Min assist Stair Management: No rails;With walker;Backwards Number of Stairs: 1 General stair comments: pt. knew sequence without prompting from therapist and was able to do wone step to imitate his stoop to enter home.    Exercises Total Joint Exercises Ankle Circles/Pumps: AROM;Both;15 reps Quad Sets: AROM;Left;10 reps Short Arc Quad: AAROM;AROM;Left;10 reps Hip ABduction/ADduction: AAROM;Left;10 reps Straight Leg Raises: AAROM;Left;10 reps Knee Flexion: AROM;Left;5 reps;Seated Goniometric ROM: -5 to 50   PT Diagnosis:    PT Problem List:   PT Treatment Interventions:     PT Goals (current goals can now be found in the care plan section)    Visit Information  Last PT Received On: 12/30/13 Assistance Needed: +1 History of Present Illness: s/p elective Lt TKA    Subjective Data  Subjective: Pt. anticipates DC following PT session.  Wife and pt. state they feel prepared and ready for Dc    Cognition  Cognition Arousal/Alertness: Awake/alert Behavior During Therapy: WFL for tasks assessed/performed Overall Cognitive Status: Within Functional Limits for tasks assessed    Balance     End of Session PT - End of Session Equipment Utilized During Treatment: Gait belt;Left knee immobilizer Activity Tolerance: Patient tolerated treatment well Patient left: in bed;with call bell/phone within reach Nurse Communication: Mobility status CPM Left Knee CPM Left Knee: Off Left Knee Flexion (Degrees): 60 Left Knee Extension (Degrees): 0   GP     Gerlean Ren  B 12/30/2013, 12:29 PM Gerlean Ren PT Acute Rehab Services  [phone] [place] [phone]

## 2013-12-30 NOTE — Progress Notes (Signed)
Subjective: 2 Days Post-Op Procedure(s) (LRB): TOTAL KNEE ARTHROPLASTY (Left) Patient reports pain as 2 on 0-10 scale.  Steve [name] is doing very well and is progressing nicely.  Minimal pain.  No nausea/vomiting.  Positive flatus but no bm as of yet.  Good appetite.    Objective: Vital signs in last 24 hours: Temp:  [98.2 F (36.8 C)-98.9 F (37.2 C)] 98.9 F (37.2 C) (03/20 0629) Pulse Rate:  [99-110] 99 (03/20 0629) Resp:  [16-18] 18 (03/20 0629) BP: (129-159)/(68-84) 129/68 mmHg (03/20 0629) SpO2:  [95 %-98 %] 95 % (03/20 0629)  Intake/Output from previous day: 03/19 0701 - 03/20 0700 In: 2040 [P.O.:2040] Out: 2900 [Urine:2900] Intake/Output this shift:     Recent Labs  12/29/13 0652 12/30/13 0422  HGB 12.5* 10.5*    Recent Labs  12/29/13 0652 12/30/13 0422  WBC 8.0 5.7  RBC 4.39 3.74*  HCT 36.1* 30.6*  PLT 361 233    Recent Labs  12/29/13 0652 12/30/13 0422  NA 134* 137  K 4.0 3.7  CL 94* 98  CO2 21 26  BUN 8 7  CREATININE 0.80 0.73  GLUCOSE 278* 236*  CALCIUM 8.5 8.2*   No results found for this basename: LABPT, INR,  in the last 72 hours  Neurologically intact ABD soft Neurovascular intact Sensation intact distally Intact pulses distally Dorsiflexion/Plantar flexion intact Incision: scant drainage No cellulitis present Compartment soft Dressing changed by me today  Assessment/Plan: 2 Days Post-Op Procedure(s) (LRB): TOTAL KNEE ARTHROPLASTY (Left) Advance diet Up with therapy Discharge home with home health  ANTON, M. Mendel Ryder 12/30/2013, 8:25 AM

## 2014-01-25 ENCOUNTER — Encounter: Payer: BC Managed Care – PPO | Admitting: Gastroenterology

## 2014-01-30 ENCOUNTER — Encounter: Payer: Self-pay | Admitting: Family

## 2014-01-30 ENCOUNTER — Ambulatory Visit (INDEPENDENT_AMBULATORY_CARE_PROVIDER_SITE_OTHER): Payer: BC Managed Care – PPO | Admitting: Family

## 2014-01-30 VITALS — BP 138/84 | HR 62 | Temp 98.0°F | Resp 16 | Ht 76.0 in | Wt 263.1 lb

## 2014-01-30 DIAGNOSIS — IMO0002 Reserved for concepts with insufficient information to code with codable children: Secondary | ICD-10-CM

## 2014-01-30 DIAGNOSIS — M1710 Unilateral primary osteoarthritis, unspecified knee: Secondary | ICD-10-CM

## 2014-01-30 DIAGNOSIS — R059 Cough, unspecified: Secondary | ICD-10-CM

## 2014-01-30 DIAGNOSIS — E785 Hyperlipidemia, unspecified: Secondary | ICD-10-CM

## 2014-01-30 DIAGNOSIS — E119 Type 2 diabetes mellitus without complications: Secondary | ICD-10-CM

## 2014-01-30 DIAGNOSIS — M179 Osteoarthritis of knee, unspecified: Secondary | ICD-10-CM

## 2014-01-30 DIAGNOSIS — R05 Cough: Secondary | ICD-10-CM

## 2014-01-30 MED ORDER — LISINOPRIL 2.5 MG PO TABS: 2.5000 mg | ORAL_TABLET | Freq: Every day | ORAL | Status: DC

## 2014-01-30 NOTE — Patient Instructions (Addendum)
Use novolog sliding scale 3 times daily before meals.  Continue lantus 35 units.  Restart lisinopril.   You can add benadryl 25 mg 1 hour before bedtime as needed for sleep.   Schedule eye exam. Complete lab work prior to leaving. Follow up in 3 months.

## 2014-01-30 NOTE — Progress Notes (Signed)
Pre visit review using our clinic review tool, if applicable. No additional management support is needed unless otherwise documented below in the visit note. 

## 2014-01-30 NOTE — Progress Notes (Signed)
Subjective:    Patient ID: Steve [name], male    DOB: 03-06-1961, 53 y.o.   MRN: [id number]  HPI  Steve [name] is a 53 yr old male who presents today for follow up of his cough.  He was last seen on 12/26/13.  Had L TKA on 12/28/13.  Post operatively he suffered from shingles outbreak behind the operative knee. Reports mild associated residual discomfort from the shingles.   BP Readings from Last 3 Encounters:  01/30/14 138/84  12/30/13 129/68  12/30/13 129/68   dm2- due for eye exam. Reports sugars have been elevated.  On Januvia, lantus 35, sliding scale before dinner. Gives himself about 12 units.  Tries to eat 3 meals.    Review of Systems See HPI  Past Medical History  Diagnosis Date  . Diabetes mellitus without complication   . Hypertension   . Hyperlipidemia   . Complication of anesthesia     has night terrors after anesthesia but can be violent when waking up  . 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 Pt stated "I forget alot of things"  . Frequent urination at night     when blood sugar runs high  . Psoriasis of scalp     nose and face; occurs mainly in winter   . Seasonal allergies   . CAD (coronary artery disease)   . Headache(784.0)   . Sinus infection 12/16/13  . ADHD (attention deficit hyperactivity disorder)     History   Social History  . Marital Status: Married    Spouse Name: N/A    Number of Children: 6  . Years of Education: N/A   Occupational History  . Not on file.   Social History Main Topics  . Smoking status: Never Smoker   . Smokeless tobacco: Never Used  . Alcohol Use: No  . Drug Use: No  . Sexual Activity: Yes    Partners: Female   Other Topics Concern  . Not on file   Social History Narrative   Regular exercise: very little   Caffeine use: sweet tea daily   6 children   Works in Architect, owns concession.   Married   Enjoys televition.      Past Surgical History  Procedure Laterality Date  . Hernia  repair  2010    abdominal hernia  . Knee arthroscopy  2012    left knee  . Cardiac catheterization      no PCI approx 10 years ago - [place] surgery      right ring finger  . Knee surgery    . Total knee arthroplasty Left 12/28/2013    Procedure: TOTAL KNEE ARTHROPLASTY;  Surgeon: Ninetta Lights, MD;  Location: [facility];  Service: Orthopedics;  Laterality: Left;    Family History  Problem Relation Age of Onset  . Diabetes Mother   . Cancer Mother     history breast cancer  . Hypertension Mother   . Cancer Father     prostate  . Alzheimer's disease Father   . Diabetes Father   . Hypertension Father   . Cancer Maternal Uncle     colon  . Colon cancer Maternal Uncle 70  . Cancer Maternal Grandmother     breast  . Cancer Maternal Grandfather     prostate  . Cancer Maternal Uncle     prostate  . Colon cancer Maternal Uncle 41  . Cancer Maternal  Uncle     prostate  . Cancer Cousin 47    metastatic colon cancer?  . Colon polyps Paternal Uncle   . Esophageal cancer Neg Hx     Allergies  Allergen Reactions  . Sulfur Itching    Current Outpatient Prescriptions on File Prior to Visit  Medication Sig Dispense Refill  . aspirin EC 325 MG tablet Take 1 tablet (325 mg total) by mouth daily.  30 tablet  0  . fluticasone (FLONASE) 50 MCG/ACT nasal spray Place 2 sprays into both nostrils daily.  16 g  6  . insulin aspart (NOVOLOG FLEXPEN) 100 UNIT/ML FlexPen Inject 0-12 Units into the skin 3 (three) times daily with meals. sliding scale- check sugar and inject 3 times daily before meals as below:  <150-   Zero units 150-200 2 units 201-250 4 units 251-300 6 units 301-350 8 units 351-400 10 units >400             12 units and contact us.      . insulin glargine (LANTUS) 100 UNIT/ML injection Inject 0.35 mLs (35 Units total) into the skin at bedtime. Sliding scale  10 mL  3  . lisinopril (PRINIVIL,ZESTRIL) 2.5 MG tablet Take 2.5 mg by mouth daily.      .  metFORMIN (GLUCOPHAGE) 1000 MG tablet Take 1 tablet (1,000 mg total) by mouth 2 (two) times daily with a meal.  60 tablet  2  . methocarbamol (ROBAXIN) 500 MG tablet Take 1 tablet (500 mg total) by mouth 4 (four) times daily.  90 tablet  0  . Multiple Vitamins-Minerals (ZINC PO) Take 2 tablets by mouth every morning.      . ondansetron (ZOFRAN) 4 MG tablet Take 1 tablet (4 mg total) by mouth every 8 (eight) hours as needed for nausea or vomiting.  40 tablet  0  . sitaGLIPtin (JANUVIA) 100 MG tablet Take 100 mg by mouth daily.      Marland Kitchen oxyCODONE-acetaminophen (ROXICET) 5-325 MG per tablet Take 1-2 tablets by mouth every 4 (four) hours as needed.  90 tablet  0   No current facility-administered medications on file prior to visit.    BP 138/84  Pulse 62  Temp(Src) 98 F (36.7 C) (Oral)  Resp 16  Ht 6\' 4"  (1.93 m)  Wt 263 lb 1.3 oz (119.332 kg)  BMI 32.04 kg/m2  SpO2 99%       Objective:   Physical Exam  Constitutional: He is oriented to person, place, and time. He appears well-developed and well-nourished. No distress.  Cardiovascular: Normal rate and regular rhythm.   No murmur heard. Pulmonary/Chest: Effort normal and breath sounds normal. No respiratory distress. He has no wheezes. He has no rales. He exhibits no tenderness.  Neurological: He is alert and oriented to person, place, and time.  Skin: Skin is warm and dry.  L knee incision appears well healed.  Psychiatric: He has a normal mood and affect. His behavior is normal. Judgment and thought content normal.          Assessment & Plan:

## 2014-01-31 ENCOUNTER — Telehealth: Payer: Self-pay | Admitting: Family

## 2014-01-31 DIAGNOSIS — E785 Hyperlipidemia, unspecified: Secondary | ICD-10-CM

## 2014-01-31 LAB — BASIC METABOLIC PANEL WITH GFR
BUN: 12 mg/dL (ref 6–23)
CALCIUM: 9.5 mg/dL (ref 8.4–10.5)
CO2: 28 mEq/L (ref 19–32)
CREATININE: 0.89 mg/dL (ref 0.50–1.35)
Chloride: 102 mEq/L (ref 96–112)
GFR, Est Non African American: >89 mL/min
Glucose, Bld: 76 mg/dL (ref 70–99)
Potassium: 4.1 mEq/L (ref 3.5–5.3)
SODIUM: 139 meq/L (ref 135–145)

## 2014-01-31 LAB — HEPATIC FUNCTION PANEL
ALBUMIN: 4.4 g/dL (ref 3.5–5.2)
ALK PHOS: 67 U/L (ref 39–117)
ALT: 23 U/L (ref 0–53)
AST: 18 U/L (ref 0–37)
BILIRUBIN DIRECT: 0.1 mg/dL (ref 0.0–0.3)
BILIRUBIN TOTAL: 0.3 mg/dL (ref 0.2–1.2)
Indirect Bilirubin: 0.2 mg/dL (ref 0.2–1.2)
Total Protein: 7 g/dL (ref 6.0–8.3)

## 2014-01-31 LAB — MICROALBUMIN / CREATININE URINE RATIO
Creatinine, Urine: 533.3 mg/dL
Microalb Creat Ratio: 7.8 mg/g (ref 0.0–30.0)
Microalb, Ur: 4.16 mg/dL — ABNORMAL HIGH (ref 0.00–1.89)

## 2014-01-31 LAB — LIPID PANEL
CHOLESTEROL: 213 mg/dL — AB (ref 0–200)
HDL: 36 mg/dL — ABNORMAL LOW (ref >39)
Total CHOL/HDL Ratio: 5.9 Ratio
Triglycerides: 498 mg/dL — ABNORMAL HIGH (ref <150)

## 2014-01-31 LAB — HEMOGLOBIN A1C
Hgb A1c MFr Bld: 7.5 % — ABNORMAL HIGH (ref <5.7)
Mean Plasma Glucose: 169 mg/dL — ABNORMAL HIGH (ref <117)

## 2014-01-31 MED ORDER — ATORVASTATIN CALCIUM 10 MG PO TABS: 10.0000 mg | ORAL_TABLET | Freq: Every day | ORAL | Status: DC

## 2014-01-31 NOTE — Assessment & Plan Note (Signed)
Resolved. Rsume ace for renal protection.  Not clear that ACE was cause of cough. I have advised the pt to let me know if he develops recurrent cough.

## 2014-01-31 NOTE — Telephone Encounter (Signed)
Please contact Steve [name] and let him know that his sugar is above goal.  I would like him to add the meal coverage prior to meals TID as we discussed at his visit. Cholesterol remains above goal and triglycerides are very high.  I recommend that he start atorvastatin once daily and work on low fat diet. Avoid concentrated sweets/white fluffy carbs. Repeat flp/lft in 6 weeks.

## 2014-01-31 NOTE — Assessment & Plan Note (Signed)
S/P L TKA, has follow up with ortho this week.

## 2014-01-31 NOTE — Assessment & Plan Note (Signed)
Diabetes type 2. A1C remains above goal.  Lab Results  Component Value Date   HGBA1C 7.5* 01/30/2014    Add SSS TID (was only doing AC dinner).

## 2014-01-31 NOTE — Telephone Encounter (Signed)
Notified pt and he voices understanding. Lab order entered. 

## 2014-02-08 ENCOUNTER — Telehealth: Payer: Self-pay

## 2014-02-08 NOTE — Telephone Encounter (Signed)
Relevant patient education mailed to patient.  

## 2014-02-13 ENCOUNTER — Other Ambulatory Visit: Payer: Self-pay | Admitting: Family

## 2014-03-01 ENCOUNTER — Inpatient Hospital Stay (HOSPITAL_COMMUNITY): Admission: RE | Admit: 2014-03-01 | Payer: BC Managed Care – PPO | Source: Ambulatory Visit

## 2014-03-07 NOTE — Progress Notes (Signed)
Dr.Bates ( on call for Dr. Erik Obey ) made aware that pt stated that his surgery was canceled last week.

## 2014-03-07 NOTE — Progress Notes (Signed)
Spoke with pt to complete SDW pre-op call and pt stated " I canceled my procedure last week." Pt was advised to call surgeons office to make MD aware.

## 2014-03-08 ENCOUNTER — Ambulatory Visit (HOSPITAL_COMMUNITY): Admit: 2014-03-08 | Payer: BC Managed Care – PPO | Admitting: Otolaryngology

## 2014-03-08 ENCOUNTER — Encounter (HOSPITAL_COMMUNITY): Payer: Self-pay | Admitting: Certified Registered"

## 2014-03-08 ENCOUNTER — Encounter (HOSPITAL_COMMUNITY): Payer: Self-pay

## 2014-03-08 SURGERY — SEPTOPLASTY, NOSE, WITH NASAL TURBINATE REDUCTION
Anesthesia: General

## 2014-03-21 ENCOUNTER — Telehealth: Payer: Self-pay | Admitting: *Deleted

## 2014-03-21 MED ORDER — METFORMIN HCL 1000 MG PO TABS: ORAL_TABLET | ORAL | Status: DC

## 2014-03-21 MED ORDER — SITAGLIPTIN PHOSPHATE 100 MG PO TABS: 100.0000 mg | ORAL_TABLET | Freq: Every day | ORAL | Status: DC

## 2014-03-21 NOTE — Telephone Encounter (Signed)
Pt called requesting refills on his metformin and [place] [facility] [address]. Pt also scheduled 3 month f/u for 05/05/14 at 10:30am. Refills sent.

## 2014-04-05 ENCOUNTER — Telehealth: Payer: Self-pay | Admitting: Family

## 2014-04-05 NOTE — Telephone Encounter (Signed)
Please contact pt to schedule OV today. OK to double book if necessary,

## 2014-04-05 NOTE — Telephone Encounter (Signed)
Patient scheduled appointment for 04/06/14

## 2014-04-05 NOTE — Telephone Encounter (Signed)
Has shingles again, request meds and pain meds. Please call into [facility] [facility]. Please advise

## 2014-04-06 ENCOUNTER — Encounter: Payer: Self-pay | Admitting: Physician Assistant

## 2014-04-06 ENCOUNTER — Ambulatory Visit (INDEPENDENT_AMBULATORY_CARE_PROVIDER_SITE_OTHER): Payer: BC Managed Care – PPO | Admitting: Physician Assistant

## 2014-04-06 VITALS — BP 138/82 | HR 81 | Temp 97.6°F | Resp 16 | Ht 76.0 in | Wt 276.5 lb

## 2014-04-06 DIAGNOSIS — B0229 Other postherpetic nervous system involvement: Secondary | ICD-10-CM

## 2014-04-06 DIAGNOSIS — L989 Disorder of the skin and subcutaneous tissue, unspecified: Secondary | ICD-10-CM

## 2014-04-06 MED ORDER — INSULIN ASPART 100 UNIT/ML FLEXPEN: 0.0000 [IU] | PEN_INJECTOR | Freq: Three times a day (TID) | SUBCUTANEOUS | Status: DC

## 2014-04-06 MED ORDER — INSULIN GLARGINE 100 UNIT/ML ~~LOC~~ SOLN: 35.0000 [IU] | Freq: Every day | SUBCUTANEOUS | Status: DC

## 2014-04-06 MED ORDER — LIDOCAINE 5 % EX OINT
1.0000 | TOPICAL_OINTMENT | Freq: Every day | CUTANEOUS | Status: DC
Start: 2014-04-06 — End: 2014-05-05

## 2014-04-06 NOTE — Patient Instructions (Signed)
Please use lidocaine ointment as directed.  Monitor for new rash.  Do not feel this was shingles, but I do think you have some residual symptoms from your previous shingles infection.

## 2014-04-06 NOTE — Progress Notes (Signed)
Pre visit review using our clinic review tool, if applicable. No additional management support is needed unless otherwise documented below in the visit note/SLS  

## 2014-04-09 DIAGNOSIS — L989 Disorder of the skin and subcutaneous tissue, unspecified: Secondary | ICD-10-CM | POA: Insufficient documentation

## 2014-04-09 NOTE — Assessment & Plan Note (Signed)
Unclear etiology.  Healed.  Doubt active shingles outbreak.  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  Rx Lidocaine ointment.  Continue to monitor area for new lesions.  Return precautions discussed with patient.

## 2014-04-09 NOTE — Progress Notes (Signed)
Patient presents to clinic today c/o blister of his posterior left leg, with some pain, first noticed 5 days ago.  Patient denies fever, chills, malaise.  Denies rash or blister elsewhere.  Has previous history of shingles at same location a few months ago.  Past Medical History  Diagnosis Date  . Diabetes mellitus without complication   . Hypertension   . Hyperlipidemia   . Complication of anesthesia     has night terrors after anesthesia but can be violent when waking up  . 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 Pt stated "I forget alot of things"  . Frequent urination at night     when blood sugar runs high  . Psoriasis of scalp     nose and face; occurs mainly in winter   . Seasonal allergies   . CAD (coronary artery disease)   . Headache(784.0)   . Sinus infection 12/16/13  . ADHD (attention deficit hyperactivity disorder)     Current Outpatient Prescriptions on File Prior to Visit  Medication Sig Dispense Refill  . aspirin EC 325 MG tablet Take 1 tablet (325 mg total) by mouth daily.  30 tablet  0  . atorvastatin (LIPITOR) 10 MG tablet Take 1 tablet (10 mg total) by mouth daily.  30 tablet  2  . fluticasone (FLONASE) 50 MCG/ACT nasal spray Place 2 sprays into both nostrils daily.  16 g  6  . HYDROmorphone (DILAUDID) 2 MG tablet Take 1 tablet by mouth every 4 (four) hours as needed.      Marland Kitchen lisinopril (PRINIVIL,ZESTRIL) 2.5 MG tablet Take 1 tablet (2.5 mg total) by mouth daily.  30 tablet  2  . metFORMIN (GLUCOPHAGE) 1000 MG tablet TAKE ONE TABLET BY MOUTH TWICE DAILY WITH A MEAL  60 tablet  3  . methocarbamol (ROBAXIN) 500 MG tablet Take 1 tablet (500 mg total) by mouth 4 (four) times daily.  90 tablet  0  . Multiple Vitamins-Minerals (ZINC PO) Take 2 tablets by mouth every morning.      Marland Kitchen oxyCODONE-acetaminophen (ROXICET) 5-325 MG per tablet Take 1-2 tablets by mouth every 4 (four) hours as needed.  90 tablet  0  . sitaGLIPtin (JANUVIA) 100 MG tablet Take 1 tablet  (100 mg total) by mouth daily.  30 tablet  3   No current facility-administered medications on file prior to visit.    Allergies  Allergen Reactions  . Sulfur Itching    Family History  Problem Relation Age of Onset  . Diabetes Mother   . Cancer Mother     history breast cancer  . Hypertension Mother   . Cancer Father     prostate  . Alzheimer's disease Father   . Diabetes Father   . Hypertension Father   . Cancer Maternal Uncle     colon  . Colon cancer Maternal Uncle 40  . Cancer Maternal Grandmother     breast  . Cancer Maternal Grandfather     prostate  . Cancer Maternal Uncle     prostate  . Colon cancer Maternal Uncle 42  . Cancer Maternal Uncle     prostate  . Cancer Cousin 47    metastatic colon cancer?  . Colon polyps Paternal Uncle   . Esophageal cancer Neg Hx     History   Social History  . Marital Status: Married    Spouse Name: N/A    Number of Children: 6  . Years of Education:  N/A   Social History Main Topics  . Smoking status: Never Smoker   . Smokeless tobacco: Never Used  . Alcohol Use: No  . Drug Use: No  . Sexual Activity: Yes    Partners: Female   Other Topics Concern  . None   Social History Narrative   Regular exercise: very little   Caffeine use: sweet tea daily   6 children   Works in Architect, owns concession.   Married   Enjoys televition.      Review of Systems - See HPI.  All other ROS are negative.  BP 138/82  Pulse 81  Temp(Src) 97.6 F (36.4 C) (Oral)  Resp 16  Ht $R'6\' 4"'WO$  (1.93 m)  Wt 276 lb 8 oz (125.42 kg)  BMI 33.67 kg/m2  SpO2 98%  Physical Exam  Vitals reviewed. Constitutional: He is oriented to person, place, and time.  Cardiovascular: Normal rate, regular rhythm, normal heart sounds and intact distal pulses.   Pulmonary/Chest: Effort normal. No respiratory distress. He has no wheezes. He has no rales. He exhibits no tenderness.  Neurological: He is alert and oriented to person, place, and  time.  Skin: Skin is warm and dry. No rash noted.  Presence of a 3 mm area of dry, scaly skin located on posterior left leg.  No evidence of vesicular lesion or rash.    Recent Results (from the past 2160 hour(s))  BASIC METABOLIC PANEL WITH GFR     Status: None   Collection Time    01/30/14  4:45 AM      Result Value Ref Range   Sodium 139  135 - 145 mEq/L   Potassium 4.1  3.5 - 5.3 mEq/L   Chloride 102  96 - 112 mEq/L   CO2 28  19 - 32 mEq/L   Glucose, Bld 76  70 - 99 mg/dL   BUN 12  6 - 23 mg/dL   Creat 0.89  0.50 - 1.35 mg/dL   Calcium 9.5  8.4 - 10.5 mg/dL   GFR, Est African American >89     GFR, Est Non African American >89     Comment:       The estimated GFR is a calculation valid for adults (>=31 years old)     that uses the CKD-EPI algorithm to adjust for age and sex. It is       not to be used for children, pregnant women, hospitalized patients,        patients on dialysis, or with rapidly changing kidney function.     According to the NKDEP, eGFR >89 is normal, 60-89 shows mild     impairment, 30-59 shows moderate impairment, 15-29 shows severe     impairment and <15 is ESRD.        LIPID PANEL     Status: Abnormal   Collection Time    01/30/14  4:45 AM      Result Value Ref Range   Cholesterol 213 (*) 0 - 200 mg/dL   Comment: ATP III Classification:           < 200        mg/dL        Desirable          200 - 239     mg/dL        Borderline High          >= 240        mg/dL  High         Triglycerides 498 (*) <150 mg/dL   HDL 36 (*) >39 mg/dL   Total CHOL/HDL Ratio 5.9     VLDL NOT CALC  0 - 40 mg/dL   Comment:       Not calculated due to Triglyceride >400.     Suggest ordering Direct LDL (Unit Code: [id number]).   LDL Cholesterol NOT CALC  0 - 99 mg/dL   Comment:       Not calculated due to Triglyceride >400.     Suggest ordering Direct LDL (Unit Code: [phone]).           Total Cholesterol/HDL Ratio:CHD Risk                            Coronary Heart  Disease Risk Table                                            Men       Women              1/2 Average Risk              3.4        3.3                  Average Risk              5.0        4.4               2X Average Risk              9.6        7.1               3X Average Risk             23.4       11.0     Use the calculated Patient Ratio above and the CHD Risk table      to determine the patient's CHD Risk.     ATP III Classification (LDL):           < 100        mg/dL         Optimal          100 - 129     mg/dL         Near or Above Optimal          130 - 159     mg/dL         Borderline High          160 - 189     mg/dL         High           > 190        mg/dL         Very High        HEMOGLOBIN A1C     Status: Abnormal   Collection Time    01/30/14  4:45 AM      Result Value Ref Range   Hemoglobin A1C 7.5 (*) <5.7 %   Comment:  According to the ADA Clinical Practice Recommendations for 2011, when     HbA1c is used as a screening test:             >=6.5%   Diagnostic of Diabetes Mellitus                (if abnormal result is confirmed)           5.7-6.4%   Increased risk of developing Diabetes Mellitus           References:Diagnosis and Classification of Diabetes Mellitus,Diabetes     RCVK,1840,37(VOHKG 1):S62-S69 and Standards of Medical Care in             Diabetes - 2011,Diabetes OVPC,3403,52 (Suppl 1):S11-S61.         Mean Plasma Glucose 169 (*) <117 mg/dL  HEPATIC FUNCTION PANEL     Status: None   Collection Time    01/30/14  4:45 AM      Result Value Ref Range   Total Bilirubin 0.3  0.2 - 1.2 mg/dL   Bilirubin, Direct 0.1  0.0 - 0.3 mg/dL   Indirect Bilirubin 0.2  0.2 - 1.2 mg/dL   Alkaline Phosphatase 67  39 - 117 U/L   AST 18  0 - 37 U/L   ALT 23  0 - 53 U/L   Total Protein 7.0  6.0 - 8.3 g/dL   Albumin 4.4  3.5 - 5.2 g/dL  MICROALBUMIN / CREATININE URINE RATIO     Status:  Abnormal   Collection Time    01/30/14  4:45 AM      Result Value Ref Range   Microalb, Ur 4.16 (*) 0.00 - 1.89 mg/dL   Creatinine, Urine 533.3     Comment: Result repeated and verified.     Result confirmed by automatic dilution.   Microalb Creat Ratio 7.8  0.0 - 30.0 mg/g    Assessment/Plan: Skin lesion Unclear etiology.  Healed.  Doubt active shingles outbreak.  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  Rx Lidocaine ointment.  Continue to monitor area for new lesions.  Return precautions discussed with patient.

## 2014-04-26 ENCOUNTER — Telehealth: Payer: Self-pay

## 2014-04-26 NOTE — Telephone Encounter (Signed)
Diabetic bundle:  I spoke to pt and he states he will come in tomorrow for his labs  Labs already ordered

## 2014-04-28 ENCOUNTER — Other Ambulatory Visit: Payer: Self-pay | Admitting: Family

## 2014-04-28 ENCOUNTER — Telehealth: Payer: Self-pay | Admitting: Family

## 2014-04-28 NOTE — Telephone Encounter (Signed)
Steve [name]  Patient is requesting that this be sent to [facility] on [facility]

## 2014-04-28 NOTE — Telephone Encounter (Signed)
See 04/28/14 refill note.

## 2014-04-29 LAB — LIPID PANEL
CHOL/HDL RATIO: 3.5 ratio
CHOLESTEROL: 117 mg/dL (ref 0–200)
HDL: 33 mg/dL — ABNORMAL LOW (ref >39)
LDL Cholesterol: 30 mg/dL (ref 0–99)
TRIGLYCERIDES: 271 mg/dL — AB (ref <150)
VLDL: 54 mg/dL — ABNORMAL HIGH (ref 0–40)

## 2014-04-29 LAB — HEPATIC FUNCTION PANEL
ALT: 29 U/L (ref 0–53)
AST: 22 U/L (ref 0–37)
Albumin: 3.9 g/dL (ref 3.5–5.2)
Alkaline Phosphatase: 65 U/L (ref 39–117)
BILIRUBIN INDIRECT: 0.3 mg/dL (ref 0.2–1.2)
Bilirubin, Direct: 0.1 mg/dL (ref 0.0–0.3)
Total Bilirubin: 0.4 mg/dL (ref 0.2–1.2)
Total Protein: 6.2 g/dL (ref 6.0–8.3)

## 2014-04-30 ENCOUNTER — Telehealth: Payer: Self-pay | Admitting: Family

## 2014-04-30 NOTE — Telephone Encounter (Signed)
Cholesterol is improving. Continue lipitor. Triglycerides remain elevated.  Continue to work on avoiding concentrated sweets/white fluffy carbs.  Add fish oil 2000mg  bid.  Regular exercise to help bring up good cholesterol.

## 2014-05-02 NOTE — Telephone Encounter (Signed)
LMOVM for pt to return call concerning results.

## 2014-05-03 NOTE — Telephone Encounter (Signed)
Left message for pt to return my call.

## 2014-05-03 NOTE — Telephone Encounter (Signed)
Notified pt and he voices understanding. 

## 2014-05-05 ENCOUNTER — Encounter: Payer: Self-pay | Admitting: Family

## 2014-05-05 ENCOUNTER — Ambulatory Visit (INDEPENDENT_AMBULATORY_CARE_PROVIDER_SITE_OTHER): Payer: BC Managed Care – PPO | Admitting: Family

## 2014-05-05 ENCOUNTER — Telehealth: Payer: Self-pay | Admitting: Family

## 2014-05-05 VITALS — BP 142/94 | HR 84 | Temp 98.0°F | Resp 16 | Ht 76.0 in | Wt 263.0 lb

## 2014-05-05 DIAGNOSIS — E119 Type 2 diabetes mellitus without complications: Secondary | ICD-10-CM

## 2014-05-05 DIAGNOSIS — E785 Hyperlipidemia, unspecified: Secondary | ICD-10-CM

## 2014-05-05 DIAGNOSIS — B0229 Other postherpetic nervous system involvement: Secondary | ICD-10-CM

## 2014-05-05 DIAGNOSIS — Z0000 Encounter for general adult medical examination without abnormal findings: Secondary | ICD-10-CM

## 2014-05-05 LAB — HEMOGLOBIN A1C
HEMOGLOBIN A1C: 8.8 % — AB (ref <5.7)
MEAN PLASMA GLUCOSE: 206 mg/dL — AB (ref <117)

## 2014-05-05 MED ORDER — LISINOPRIL 2.5 MG PO TABS: 2.5000 mg | ORAL_TABLET | Freq: Every day | ORAL | Status: DC

## 2014-05-05 MED ORDER — SITAGLIPTIN PHOSPHATE 100 MG PO TABS: ORAL_TABLET | ORAL | Status: DC

## 2014-05-05 NOTE — Assessment & Plan Note (Signed)
Reports improved sugars on NPH and novolog.

## 2014-05-05 NOTE — Telephone Encounter (Signed)
Opened in error

## 2014-05-05 NOTE — Assessment & Plan Note (Signed)
LDL at goal. Advised pt to take lipitor not simvastatin.

## 2014-05-05 NOTE — Patient Instructions (Signed)
Please complete lab work prior to leaving. Follow up in 3 months.  

## 2014-05-05 NOTE — Assessment & Plan Note (Signed)
Improved with gabapentin, continue same. 

## 2014-05-05 NOTE — Progress Notes (Signed)
Subjective:    Patient ID: Steve [name], male    DOB: [birth year], 53 y.o.   MRN: [id number]  HPI  Steve [name] is a 53 yr old male who presents today for follow up.  1) DM2-  Pt felt that his sugars were high on lantus and so he discontinued and started NPH which he has at home.  Was taking 50 units hs of lantus and reports sugars remained >200. He report that he made this switch on Sunday night.  He reports AM 136 this AM.  Taking 30 units AM and 30 units PM.  He is using novolog sliding scale TID AC meals.    Lab Results  Component Value Date   HGBA1C 7.5* 01/30/2014   2) Hyperlipidemia-   Lab Results  Component Value Date   CHOL 117 04/28/2014   HDL 33* 04/28/2014   LDLCALC 30 04/28/2014   TRIG 271* 04/28/2014   CHOLHDL 3.5 04/28/2014   3) Hyperlipidemia- He reports that he started back on simvastatin instead of lipitor due to having some on hand.    4) Pain- Reports that he continues to have post herpetic neuralgia pain right lower leg. Conitnues gabapentin which seems to help    Review of Systems See HPI  Past Medical History  Diagnosis Date  . Diabetes mellitus without complication   . Hypertension   . Hyperlipidemia   . Complication of anesthesia     has night terrors after anesthesia but can be violent when waking up  . 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 Pt stated "I forget alot of things"  . Frequent urination at night     when blood sugar runs high  . Psoriasis of scalp     nose and face; occurs mainly in winter   . Seasonal allergies   . CAD (coronary artery disease)   . Headache(784.0)   . Sinus infection 12/16/13  . ADHD (attention deficit hyperactivity disorder)     History   Social History  . Marital Status: Married    Spouse Name: N/A    Number of Children: 6  . Years of Education: N/A   Occupational History  . Not on file.   Social History Main Topics  . Smoking status: Never Smoker   . Smokeless tobacco: Never Used  .  Alcohol Use: No  . Drug Use: No  . Sexual Activity: Yes    Partners: Female   Other Topics Concern  . Not on file   Social History Narrative   Regular exercise: very little   Caffeine use: sweet tea daily   6 children   Works in Architect, owns concession.   Married   Enjoys televition.      Past Surgical History  Procedure Laterality Date  . Hernia repair  2010    abdominal hernia  . Knee arthroscopy  2012    left knee  . Cardiac catheterization      no PCI approx 10 years ago - [facility]      right ring finger  . Knee surgery    . Total knee arthroplasty Left 12/28/2013    Procedure: TOTAL KNEE ARTHROPLASTY;  Surgeon: Ninetta Lights, MD;  Location: [facility];  Service: Orthopedics;  Laterality: Left;    Family History  Problem Relation Age of Onset  . Diabetes Mother   . Cancer Mother     history breast cancer  . Hypertension  Mother   . Cancer Father     prostate  . Alzheimer's disease Father   . Diabetes Father   . Hypertension Father   . Cancer Maternal Uncle     colon  . Colon cancer Maternal Uncle 87  . Cancer Maternal Grandmother     breast  . Cancer Maternal Grandfather     prostate  . Cancer Maternal Uncle     prostate  . Colon cancer Maternal Uncle 29  . Cancer Maternal Uncle     prostate  . Cancer Cousin 47    metastatic colon cancer?  . Colon polyps Paternal Uncle   . Esophageal cancer Neg Hx     Allergies  Allergen Reactions  . Sulfur Itching    Current Outpatient Prescriptions on File Prior to Visit  Medication Sig Dispense Refill  . aspirin EC 325 MG tablet Take 1 tablet (325 mg total) by mouth daily.  30 tablet  0  . atorvastatin (LIPITOR) 10 MG tablet Take 1 tablet (10 mg total) by mouth daily.  30 tablet  2  . fluticasone (FLONASE) 50 MCG/ACT nasal spray Place 2 sprays into both nostrils daily.  16 g  6  . insulin aspart (NOVOLOG FLEXPEN) 100 UNIT/ML FlexPen Inject 0-12 Units into the skin 3 (three) times  daily with meals. sliding scale- check sugar and inject 3 times daily before meals as below:  <150-   Zero units 150-200 2 units 201-250 4 units 251-300 6 units 301-350 8 units 351-400 10 units >400             12 units and contact us.  15 mL  3  . metFORMIN (GLUCOPHAGE) 1000 MG tablet TAKE ONE TABLET BY MOUTH TWICE DAILY WITH A MEAL  60 tablet  3  . methocarbamol (ROBAXIN) 500 MG tablet Take 1 tablet (500 mg total) by mouth 4 (four) times daily.  90 tablet  0  . Multiple Vitamins-Minerals (ZINC PO) Take 2 tablets by mouth every morning.      . Omega-3 Fatty Acids (FISH OIL) 1000 MG CAPS Take 2,000 mg by mouth 2 (two) times daily.      Marland Kitchen oxyCODONE-acetaminophen (ROXICET) 5-325 MG per tablet Take 1-2 tablets by mouth every 4 (four) hours as needed.  90 tablet  0   No current facility-administered medications on file prior to visit.    BP 142/94  Pulse 84  Temp(Src) 98 F (36.7 C) (Oral)  Resp 16  Ht 6\' 4"  (1.93 m)  Wt 263 lb (119.296 kg)  BMI 32.03 kg/m2  SpO2 98%       Objective:   Physical Exam  Constitutional: He is oriented to person, place, and time. He appears well-developed and well-nourished. No distress.  HENT:  Head: Normocephalic.  Cardiovascular: Normal rate and regular rhythm.   No murmur heard. Pulmonary/Chest: Effort normal and breath sounds normal. No respiratory distress. He has no wheezes. He has no rales. He exhibits no tenderness.  Neurological: He is alert and oriented to person, place, and time.  Psychiatric: He has a normal mood and affect. His behavior is normal. Judgment and thought content normal.          Assessment & Plan:

## 2014-05-05 NOTE — Progress Notes (Signed)
Pre visit review using our clinic review tool, if applicable. No additional management support is needed unless otherwise documented below in the visit note. 

## 2014-05-06 LAB — BASIC METABOLIC PANEL WITH GFR
BUN: 6 mg/dL (ref 6–23)
CALCIUM: 9.4 mg/dL (ref 8.4–10.5)
CO2: 25 meq/L (ref 19–32)
Chloride: 103 mEq/L (ref 96–112)
Creat: 0.75 mg/dL (ref 0.50–1.35)
GFR, Est African American: >89 mL/min
GFR, Est Non African American: >89 mL/min
Glucose, Bld: 130 mg/dL — ABNORMAL HIGH (ref 70–99)
Potassium: 4.4 mEq/L (ref 3.5–5.3)
SODIUM: 141 meq/L (ref 135–145)

## 2014-05-10 ENCOUNTER — Encounter: Payer: Self-pay | Admitting: Internal Medicine

## 2014-05-16 ENCOUNTER — Ambulatory Visit (AMBULATORY_SURGERY_CENTER): Payer: BC Managed Care – PPO | Admitting: *Deleted

## 2014-05-16 VITALS — Ht 75.0 in | Wt 267.2 lb

## 2014-05-16 DIAGNOSIS — Z1211 Encounter for screening for malignant neoplasm of colon: Secondary | ICD-10-CM

## 2014-05-16 MED ORDER — MOVIPREP 100 G PO SOLR: ORAL | Status: DC

## 2014-05-16 NOTE — Progress Notes (Signed)
No egg or soy allergy  Pt states that he can be combative sometimes after anesthesia  No diet medications taken  No trouble with intubation or moving his neck  Pt does have a total knee replacement

## 2014-05-22 ENCOUNTER — Other Ambulatory Visit: Payer: Self-pay | Admitting: Family

## 2014-05-22 ENCOUNTER — Encounter: Payer: Self-pay | Admitting: Internal Medicine

## 2014-05-31 ENCOUNTER — Ambulatory Visit (AMBULATORY_SURGERY_CENTER): Payer: BC Managed Care – PPO | Admitting: Internal Medicine

## 2014-05-31 ENCOUNTER — Encounter: Payer: Self-pay | Admitting: Internal Medicine

## 2014-05-31 VITALS — BP 124/72 | HR 75 | Temp 97.1°F | Resp 17 | Ht 75.0 in | Wt 267.0 lb

## 2014-05-31 DIAGNOSIS — Z1211 Encounter for screening for malignant neoplasm of colon: Secondary | ICD-10-CM

## 2014-05-31 DIAGNOSIS — D126 Benign neoplasm of colon, unspecified: Secondary | ICD-10-CM

## 2014-05-31 LAB — GLUCOSE, CAPILLARY
GLUCOSE-CAPILLARY: 116 mg/dL — AB (ref 70–99)
Glucose-Capillary: 96 mg/dL (ref 70–99)

## 2014-05-31 MED ORDER — SODIUM CHLORIDE 0.9 % IV SOLN: 500.0000 mL | INTRAVENOUS | Status: DC

## 2014-05-31 NOTE — Patient Instructions (Signed)
Colon polyps removed today, handout given. No aspirin, aspirin products, and anti-inflammatory medications for 2 weeks. Call us with any questions or concerns. Thank you!!  YOU HAD AN ENDOSCOPIC PROCEDURE TODAY AT [facility]: Refer to the procedure report that was given to you for any specific questions about what was found during the examination.  If the procedure report does not answer your questions, please call your gastroenterologist to clarify.  If you requested that your care partner not be given the details of your procedure findings, then the procedure report has been included in a sealed envelope for you to review at your convenience later.  YOU SHOULD EXPECT: Some feelings of bloating in the abdomen. Passage of more gas than usual.  Walking can help get rid of the air that was put into your GI tract during the procedure and reduce the bloating. If you had a lower endoscopy (such as a colonoscopy or flexible sigmoidoscopy) you may notice spotting of blood in your stool or on the toilet paper. If you underwent a bowel prep for your procedure, then you may not have a normal bowel movement for a few days.  DIET: Your first meal following the procedure should be a light meal and then it is ok to progress to your normal diet.  A half-sandwich or bowl of soup is an example of a good first meal.  Heavy or fried foods are harder to digest and may make you feel nauseous or bloated.  Likewise meals heavy in dairy and vegetables can cause extra gas to form and this can also increase the bloating.  Drink plenty of fluids but you should avoid alcoholic beverages for 24 hours.  ACTIVITY: Your care partner should take you home directly after the procedure.  You should plan to take it easy, moving slowly for the rest of the day.  You can resume normal activity the day after the procedure however you should NOT DRIVE or use heavy machinery for 24 hours (because of the sedation medicines used  during the test).    SYMPTOMS TO REPORT IMMEDIATELY: A gastroenterologist can be reached at any hour.  During normal business hours, 8:30 AM to 5:00 PM Monday through Friday, call [phone].  After hours and on weekends, please call the GI answering service at [phone] who will take a message and have the physician on call contact you.   Following lower endoscopy (colonoscopy or flexible sigmoidoscopy):  Excessive amounts of blood in the stool  Significant tenderness or worsening of abdominal pains  Swelling of the abdomen that is new, acute  Fever of 100F or higher  Following upper endoscopy (EGD)  Vomiting of blood or coffee ground material  New chest pain or pain under the shoulder blades  Painful or persistently difficult swallowing  New shortness of breath  Fever of 100F or higher  Black, tarry-looking stools  FOLLOW UP: If any biopsies were taken you will be contacted by phone or by letter within the next 1-3 weeks.  Call your gastroenterologist if you have not heard about the biopsies in 3 weeks.  Our staff will call the home number listed on your records the next business day following your procedure to check on you and address any questions or concerns that you may have at that time regarding the information given to you following your procedure. This is a courtesy call and so if there is no answer at the home number and we have not heard from you  through the emergency physician on call, we will assume that you have returned to your regular daily activities without incident.  SIGNATURES/CONFIDENTIALITY: You and/or your care partner have signed paperwork which will be entered into your electronic medical record.  These signatures attest to the fact that that the information above on your After Visit Summary has been reviewed and is understood.  Full responsibility of the confidentiality of this discharge information lies with you and/or your care-partner.

## 2014-05-31 NOTE — Progress Notes (Signed)
Pverryo,ven and VSS.  tecedure ends, to reco

## 2014-05-31 NOTE — Op Note (Signed)
[place]  [address]. [place], [address]   COLONOSCOPY PROCEDURE REPORT  PATIENT: Steve, [name]  MR#: [id number] BIRTHDATE: 1961-07-28 , 53  yrs. old GENDER: Male ENDOSCOPIST: Jerene Bears, MD REFERRED LT:[id number] O'Sullivan, FNP PROCEDURE DATE:  05/31/2014 PROCEDURE:   Colonoscopy with snare polypectomy First Screening Colonoscopy - Avg.  risk and is 50 yrs.  old or older Yes.  Prior Negative Screening - Now for repeat screening. N/A  History of Adenoma - Now for follow-up colonoscopy & has been > or = to 3 yrs.  N/A  Polyps Removed Today? Yes. ASA CLASS:   Class III INDICATIONS:elevated risk screening, Patient's family history of colon cancer, distant relatives, and first colonoscopy. MEDICATIONS: MAC sedation, administered by CRNA and propofol (Diprivan) 450mg  IV  DESCRIPTION OF PROCEDURE:   After the risks benefits and alternatives of the procedure were thoroughly explained, informed consent was obtained.  A digital rectal exam revealed no rectal mass.   The LB [id number]  endoscope was introduced through the anus and advanced to the cecum, which was identified by both the appendix and ileocecal valve. No adverse events experienced. The quality of the prep was good, using MoviPrep  The instrument was then slowly withdrawn as the colon was fully examined.  COLON FINDINGS: Four polyps, 2 sessile and 2 pedunculated, ranging between 3-56mm in size were found in the transverse colon, sigmoid colon (2), and rectum.  Polypectomy was performed using cold snare (2) and using hot snare (2).  All resections were complete and all polyp tissue was completely retrieved.   The colon mucosa was otherwise normal.  Retroflexed views revealed small internal hemorrhoids. The time to cecum=1 minutes 50 seconds.  Withdrawal time=14 minutes 17 seconds.  The scope was withdrawn and the procedure completed. COMPLICATIONS: There were no complications.  ENDOSCOPIC  IMPRESSION: 1.   Four sessile polyps ranging between 3-69mm in size were found in the transverse colon, sigmoid colon, and rectum; Polypectomy was performed using cold snare and using hot snare 2.   The colon mucosa was otherwise normal  RECOMMENDATIONS: 1.  Hold aspirin, aspirin products, and anti-inflammatory medication for 2 weeks. 2.  Await pathology results 3.  Timing of repeat colonoscopy will be determined by pathology findings. 4.  You will receive a letter within 1-2 weeks with the results of your biopsy as well as final recommendations.  Please call my office if you have not received a letter after 3 weeks.   eSigned:  Jerene Bears, MD 05/31/2014 9:41 AM cc: The Patient and Debbrah Alar FNP

## 2014-05-31 NOTE — Progress Notes (Signed)
Called to room to assist during endoscopic procedure.  Patient ID and intended procedure confirmed with present staff. Received instructions for my participation in the procedure from the performing physician.  

## 2014-06-01 ENCOUNTER — Telehealth: Payer: Self-pay | Admitting: *Deleted

## 2014-06-01 NOTE — Telephone Encounter (Signed)
  Follow up Call-  Call back number 05/31/2014  Post procedure Call Back phone  # [phone]  Permission to leave phone message Yes     Patient questions:  Do you have a fever, pain , or abdominal swelling? No. Pain Score  0 *  Have you tolerated food without any problems? Yes.    Have you been able to return to your normal activities? Yes.    Do you have any questions about your discharge instructions: Diet   No. Medications  No. Follow up visit  No.  Do you have questions or concerns about your Care? No.  Actions: * If pain score is 4 or above: No action needed, pain <4.

## 2014-06-02 ENCOUNTER — Telehealth: Payer: Self-pay

## 2014-06-02 NOTE — Telephone Encounter (Signed)
Pt stated that "he is going out of town and  Wants to make sure he has all of his medications."LDM

## 2014-06-06 ENCOUNTER — Encounter: Payer: Self-pay | Admitting: Internal Medicine

## 2014-06-06 NOTE — Telephone Encounter (Signed)
Cell voicemail full and unable to leave message.  Upon review of EPIC, it appears that pt still has refills remaining at his pharmacy on [address].  Left message on home # to return my call.

## 2014-07-24 ENCOUNTER — Ambulatory Visit (INDEPENDENT_AMBULATORY_CARE_PROVIDER_SITE_OTHER): Payer: BC Managed Care – PPO | Admitting: Physician Assistant

## 2014-07-24 ENCOUNTER — Encounter: Payer: Self-pay | Admitting: Physician Assistant

## 2014-07-24 VITALS — BP 155/80 | HR 94 | Temp 98.3°F | Resp 16 | Ht 75.0 in | Wt 274.1 lb

## 2014-07-24 DIAGNOSIS — J321 Chronic frontal sinusitis: Secondary | ICD-10-CM

## 2014-07-24 DIAGNOSIS — E291 Testicular hypofunction: Secondary | ICD-10-CM

## 2014-07-24 DIAGNOSIS — J329 Chronic sinusitis, unspecified: Secondary | ICD-10-CM | POA: Insufficient documentation

## 2014-07-24 DIAGNOSIS — E1169 Type 2 diabetes mellitus with other specified complication: Secondary | ICD-10-CM

## 2014-07-24 DIAGNOSIS — I10 Essential (primary) hypertension: Secondary | ICD-10-CM

## 2014-07-24 DIAGNOSIS — N521 Erectile dysfunction due to diseases classified elsewhere: Secondary | ICD-10-CM

## 2014-07-24 MED ORDER — LISINOPRIL 5 MG PO TABS: 5.0000 mg | ORAL_TABLET | Freq: Every day | ORAL | Status: DC

## 2014-07-24 NOTE — Progress Notes (Signed)
Patient presents to clinic today c/o to discuss recurrent sinus infections.  Is currently followed by ENT and was told would need sinus surgery to open up blocked sinus. Is not currently on antibiotics.  Endorses some PND over the past few days but denies sinus pressure, sinus pain, ear pain, tooth pain.  Wishes to further discuss sinus surgery.  Patient requesting flu shot at today's visit.  Past Medical History  Diagnosis Date  . Diabetes mellitus without complication   . Hypertension   . Hyperlipidemia   . Complication of anesthesia     has night terrors after anesthesia but can be violent when waking up  . 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 Pt stated "I forget alot of things"  . Frequent urination at night     when blood sugar runs high  . Psoriasis of scalp     nose and face; occurs mainly in winter   . Seasonal allergies   . CAD (coronary artery disease)   . Headache(784.0)   . Sinus infection 12/16/13  . ADHD (attention deficit hyperactivity disorder)   . Allergy     Current Outpatient Prescriptions on File Prior to Visit  Medication Sig Dispense Refill  . aspirin EC 325 MG tablet Take 1 tablet (325 mg total) by mouth daily.  30 tablet  0  . atorvastatin (LIPITOR) 10 MG tablet TAKE 1 TABLET (10 MG TOTAL) BY MOUTH DAILY.  30 tablet  2  . fluticasone (FLONASE) 50 MCG/ACT nasal spray Place 2 sprays into both nostrils daily.  16 g  6  . gabapentin (NEURONTIN) 300 MG capsule Takes BID      . insulin aspart (NOVOLOG FLEXPEN) 100 UNIT/ML FlexPen Inject 0-12 Units into the skin 3 (three) times daily with meals. sliding scale- check sugar and inject 3 times daily before meals as below:  <150-   Zero units 150-200 2 units 201-250 4 units 251-300 6 units 301-350 8 units 351-400 10 units >400             12 units and contact us.  15 mL  3  . insulin NPH Human (HUMULIN N,NOVOLIN N) 100 UNIT/ML injection Inject into the skin. 30 units in the morning and 30-40  units at bedtime.      . metFORMIN (GLUCOPHAGE) 1000 MG tablet TAKE ONE TABLET BY MOUTH TWICE DAILY WITH A MEAL  60 tablet  3  . Multiple Vitamins-Minerals (ZINC PO) Take 2 tablets by mouth every morning.      . Omega-3 Fatty Acids (FISH OIL) 1000 MG CAPS Take 2,000 mg by mouth 2 (two) times daily.      . sitaGLIPtin (JANUVIA) 100 MG tablet TAKE 1 TABLET BY MOUTH DAILY.  30 tablet  2   No current facility-administered medications on file prior to visit.    Allergies  Allergen Reactions  . Sulfur Itching    Family History  Problem Relation Age of Onset  . Diabetes Mother   . Cancer Mother     history breast cancer  . Hypertension Mother   . Cancer Father     prostate  . Alzheimer's disease Father   . Diabetes Father   . Hypertension Father   . Cancer Maternal Uncle     colon  . Colon cancer Maternal Uncle 78  . Cancer Maternal Grandmother     breast  . Cancer Maternal Grandfather     prostate  . Cancer Maternal Uncle  prostate  . Colon cancer Maternal Uncle 79  . Cancer Maternal Uncle     prostate  . Cancer Cousin 47    metastatic colon cancer?  . Colon polyps Paternal Uncle   . Esophageal cancer Neg Hx   . Stomach cancer Neg Hx   . Rectal cancer Neg Hx     History   Social History  . Marital Status: Married    Spouse Name: N/A    Number of Children: 6  . Years of Education: N/A   Social History Main Topics  . Smoking status: Never Smoker   . Smokeless tobacco: Never Used  . Alcohol Use: 0.6 - 1.2 oz/week    1-2 Cans of beer per week     Comment: rare beer  . Drug Use: No  . Sexual Activity: Yes    Partners: Female   Other Topics Concern  . None   Social History Narrative   Regular exercise: very little   Caffeine use: sweet tea daily   6 children   Works in Architect, owns concession.   Married   Enjoys televition.     Review of Systems - See HPI.  All other ROS are negative.  BP 155/80  Pulse 94  Temp(Src) 98.3 F (36.8 C) (Oral)   Resp 16  Ht $R'6\' 3"'oY$  (1.905 m)  Wt 274 lb 2 oz (124.342 kg)  BMI 34.26 kg/m2  SpO2 100%  Physical Exam  Vitals reviewed. Constitutional: He is oriented to person, place, and time and well-developed, well-nourished, and in no distress.  HENT:  Head: Normocephalic and atraumatic.  Right Ear: External ear normal.  Left Ear: External ear normal.  Nose: Nose normal.  Mouth/Throat: Oropharynx is clear and moist. No oropharyngeal exudate.  TM within normal limits bilaterally.  Eyes: Conjunctivae are normal. Pupils are equal, round, and reactive to light.  Neck: Neck supple. No thyromegaly present.  Cardiovascular: Normal rate, regular rhythm, normal heart sounds and intact distal pulses.   Pulmonary/Chest: Effort normal and breath sounds normal. No respiratory distress. He has no wheezes. He has no rales. He exhibits no tenderness.  Neurological: He is alert and oriented to person, place, and time.  Skin: Skin is warm and dry. No rash noted.  Psychiatric: Affect normal.    Recent Results (from the past 2160 hour(s))  LIPID PANEL     Status: Abnormal   Collection Time    04/28/14 12:19 PM      Result Value Ref Range   Cholesterol 117  0 - 200 mg/dL   Comment: ATP III Classification:           < 200        mg/dL        Desirable          200 - 239     mg/dL        Borderline High          >= 240        mg/dL        High         Triglycerides 271 (*) <150 mg/dL   HDL 33 (*) >39 mg/dL   Total CHOL/HDL Ratio 3.5     VLDL 54 (*) 0 - 40 mg/dL   LDL Cholesterol 30  0 - 99 mg/dL   Comment:       Total Cholesterol/HDL Ratio:CHD Risk  Coronary Heart Disease Risk Table                                            Men       Women              1/2 Average Risk              3.4        3.3                  Average Risk              5.0        4.4               2X Average Risk              9.6        7.1               3X Average Risk             23.4       11.0     Use  the calculated Patient Ratio above and the CHD Risk table      to determine the patient's CHD Risk.     ATP III Classification (LDL):           < 100        mg/dL         Optimal          100 - 129     mg/dL         Near or Above Optimal          130 - 159     mg/dL         Borderline High          160 - 189     mg/dL         High           > 190        mg/dL         Very High        HEPATIC FUNCTION PANEL     Status: None   Collection Time    04/28/14 12:19 PM      Result Value Ref Range   Total Bilirubin 0.4  0.2 - 1.2 mg/dL   Bilirubin, Direct 0.1  0.0 - 0.3 mg/dL   Indirect Bilirubin 0.3  0.2 - 1.2 mg/dL   Alkaline Phosphatase 65  39 - 117 U/L   AST 22  0 - 37 U/L   ALT 29  0 - 53 U/L   Total Protein 6.2  6.0 - 8.3 g/dL   Albumin 3.9  3.5 - 5.2 g/dL  BASIC METABOLIC PANEL WITH GFR     Status: Abnormal   Collection Time    05/05/14 11:48 AM      Result Value Ref Range   Sodium 141  135 - 145 mEq/L   Potassium 4.4  3.5 - 5.3 mEq/L   Chloride 103  96 - 112 mEq/L   CO2 25  19 - 32 mEq/L   Glucose, Bld 130 (*) 70 - 99 mg/dL   BUN 6  6 - 23 mg/dL   Creat 0.75  0.50 - 1.35 mg/dL   Calcium 9.4  8.4 -  10.5 mg/dL   GFR, Est African American >89     GFR, Est Non African American >89     Comment:       The estimated GFR is a calculation valid for adults (>=48 years old)     that uses the CKD-EPI algorithm to adjust for age and sex. It is       not to be used for children, pregnant women, hospitalized patients,        patients on dialysis, or with rapidly changing kidney function.     According to the NKDEP, eGFR >89 is normal, 60-89 shows mild     impairment, 30-59 shows moderate impairment, 15-29 shows severe     impairment and <15 is ESRD.        HEMOGLOBIN A1C     Status: Abnormal   Collection Time    05/05/14 11:48 AM      Result Value Ref Range   Hemoglobin A1C 8.8 (*) <5.7 %   Comment:                                                                            According  to the ADA Clinical Practice Recommendations for 2011, when     HbA1c is used as a screening test:             >=6.5%   Diagnostic of Diabetes Mellitus                (if abnormal result is confirmed)           5.7-6.4%   Increased risk of developing Diabetes Mellitus           References:Diagnosis and Classification of Diabetes Mellitus,Diabetes     OEVO,3500,93(GHWEX 1):S62-S69 and Standards of Medical Care in             Diabetes - 2011,Diabetes Care,2011,34 (Suppl 1):S11-S61.         Mean Plasma Glucose 206 (*) <117 mg/dL  GLUCOSE, CAPILLARY     Status: Abnormal   Collection Time    05/31/14  8:15 AM      Result Value Ref Range   Glucose-Capillary 116 (*) 70 - 99 mg/dL  GLUCOSE, CAPILLARY     Status: None   Collection Time    05/31/14  9:42 AM      Result Value Ref Range   Glucose-Capillary 96  70 - 99 mg/dL    Assessment/Plan: Essential hypertension, benign Increase lisinopril to 5 mg daily. DASH diet given. Monitor BP at home.  Follow-up as scheduled.  Hypogonadism in male Referral to Urology placed per patient request.  Sinusitis, chronic Encouraged patient to follow the ENT MD's advice and proceed with surgery.

## 2014-07-24 NOTE — Progress Notes (Deleted)
History of Present Illness: @NAMEBYAGE @ is a 53 y.o. male who present to the clinic today complaining of sinus pressure, sinus pain and nasal congestion. Patient endorses ***.  Patient denies ***.   History: Past Medical History  Diagnosis Date  . Diabetes mellitus without complication   . Hypertension   . Hyperlipidemia   . Complication of anesthesia     has night terrors after anesthesia but can be violent when waking up  . 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 Pt stated "I forget alot of things"  . Frequent urination at night     when blood sugar runs high  . Psoriasis of scalp     nose and face; occurs mainly in winter   . Seasonal allergies   . CAD (coronary artery disease)   . Headache(784.0)   . Sinus infection 12/16/13  . ADHD (attention deficit hyperactivity disorder)   . Allergy    Current outpatient prescriptions:aspirin EC 325 MG tablet, Take 1 tablet (325 mg total) by mouth daily., Disp: 30 tablet, Rfl: 0;  atorvastatin (LIPITOR) 10 MG tablet, TAKE 1 TABLET (10 MG TOTAL) BY MOUTH DAILY., Disp: 30 tablet, Rfl: 2;  fluticasone (FLONASE) 50 MCG/ACT nasal spray, Place 2 sprays into both nostrils daily., Disp: 16 g, Rfl: 6;  gabapentin (NEURONTIN) 300 MG capsule, Takes BID, Disp: , Rfl:  insulin aspart (NOVOLOG FLEXPEN) 100 UNIT/ML FlexPen, Inject 0-12 Units into the skin 3 (three) times daily with meals. sliding scale- check sugar and inject 3 times daily before meals as below:  <150-   Zero units 150-200 2 units 201-250 4 units 251-300 6 units 301-350 8 units 351-400 10 units >400             12 units and contact us., Disp: 15 mL, Rfl: 3 insulin NPH Human (HUMULIN N,NOVOLIN N) 100 UNIT/ML injection, Inject into the skin. 30 units in the morning and 30-40 units at bedtime., Disp: , Rfl: ;  lisinopril (PRINIVIL,ZESTRIL) 2.5 MG tablet, Take 1 tablet (2.5 mg total) by mouth daily., Disp: 30 tablet, Rfl: 2;  metFORMIN (GLUCOPHAGE) 1000 MG tablet, TAKE ONE TABLET BY  MOUTH TWICE DAILY WITH A MEAL, Disp: 60 tablet, Rfl: 3 Multiple Vitamins-Minerals (ZINC PO), Take 2 tablets by mouth every morning., Disp: , Rfl: ;  Omega-3 Fatty Acids (FISH OIL) 1000 MG CAPS, Take 2,000 mg by mouth 2 (two) times daily., Disp: , Rfl: ;  sitaGLIPtin (JANUVIA) 100 MG tablet, TAKE 1 TABLET BY MOUTH DAILY., Disp: 30 tablet, Rfl: 2 Allergies  Allergen Reactions  . Sulfur Itching   Family History  Problem Relation Age of Onset  . Diabetes Mother   . Cancer Mother     history breast cancer  . Hypertension Mother   . Cancer Father     prostate  . Alzheimer's disease Father   . Diabetes Father   . Hypertension Father   . Cancer Maternal Uncle     colon  . Colon cancer Maternal Uncle 63  . Cancer Maternal Grandmother     breast  . Cancer Maternal Grandfather     prostate  . Cancer Maternal Uncle     prostate  . Colon cancer Maternal Uncle 2  . Cancer Maternal Uncle     prostate  . Cancer Cousin 47    metastatic colon cancer?  . Colon polyps Paternal Uncle   . Esophageal cancer Neg Hx   . Stomach cancer Neg Hx   .  Rectal cancer Neg Hx    History   Social History  . Marital Status: Married    Spouse Name: N/A    Number of Children: 6  . Years of Education: N/A   Social History Main Topics  . Smoking status: Never Smoker   . Smokeless tobacco: Never Used  . Alcohol Use: 0.6 - 1.2 oz/week    1-2 Cans of beer per week     Comment: rare beer  . Drug Use: No  . Sexual Activity: Yes    Partners: Female   Other Topics Concern  . None   Social History Narrative   Regular exercise: very little   Caffeine use: sweet tea daily   6 children   Works in Architect, owns concession.   Married   Enjoys televition.      Review of Systems: See HPI.  All other ROS are negative.  Physical Examination: Ht 6\' 3"  (1.905 m)  Wt 274 lb 2 oz (124.342 kg)  BMI 34.26 kg/m2  General appearance: alert, cooperative and appears stated age Head: Normocephalic,  without obvious abnormality, atraumatic, sinuses tender to percussion Eyes: conjunctivae/corneas clear. PERRL, EOM's intact. Fundi benign. Ears: normal TM's and external ear canals both ears Nose: moderate congestion, turbinates swollen, sinus tenderness {DESC; LEFT/RIGHT/BI:30031} Throat: lips, mucosa, and tongue normal; teeth and gums normal Neck: no adenopathy, no carotid bruit, no JVD, supple, symmetrical, trachea midline and thyroid not enlarged, symmetric, no tenderness/mass/nodules Lungs: clear to auscultation bilaterally Chest wall: no tenderness  Labs/Diagnostics: ***.  Assessment/Plan: No problem-specific assessment & plan notes found for this encounter.

## 2014-07-24 NOTE — Patient Instructions (Addendum)
Please continue allergy medications as directed.  I do recommend the sinus surgery due to your severe and recurrent symptoms. Please follow-up with the ENT as directed.  Schedule a 30-minute appointment for skin tag removals.   Please schedule an appointment in early November for a follow-up of your diabetes and blood work.  Your BP is still elevated on recheck.  I am increasing your lisinopril to 5 mg daily.  Please take as directed.  Limit salt intake.  (Read information below on DASH diet for high BP).  You will be contacted by [facility] for assessment.  DASH Eating Plan DASH stands for "Dietary Approaches to Stop Hypertension." The DASH eating plan is a healthy eating plan that has been shown to reduce high blood pressure (hypertension). Additional health benefits may include reducing the risk of type 2 diabetes mellitus, heart disease, and stroke. The DASH eating plan may also help with weight loss. WHAT DO I NEED TO KNOW ABOUT THE DASH EATING PLAN? For the DASH eating plan, you will follow these general guidelines:  Choose foods with a percent daily value for sodium of less than 5% (as listed on the food label).  Use salt-free seasonings or herbs instead of table salt or sea salt.  Check with your health care provider or pharmacist before using salt substitutes.  Eat lower-sodium products, often labeled as "lower sodium" or "no salt added."  Eat fresh foods.  Eat more vegetables, fruits, and low-fat dairy products.  Choose whole grains. Look for the word "whole" as the first word in the ingredient list.  Choose fish and skinless chicken or [place] more often than red meat. Limit fish, poultry, and meat to 6 oz (170 g) each day.  Limit sweets, desserts, sugars, and sugary drinks.  Choose heart-healthy fats.  Limit cheese to 1 oz (28 g) per day.  Eat more home-cooked food and less restaurant, buffet, and fast food.  Limit fried foods.  Cook foods using methods other  than frying.  Limit canned vegetables. If you do use them, rinse them well to decrease the sodium.  When eating at a restaurant, ask that your food be prepared with less salt, or no salt if possible. WHAT FOODS CAN I EAT? Seek help from a dietitian for individual calorie needs. Grains Whole grain or whole wheat bread. Brown rice. Whole grain or whole wheat pasta. Quinoa, bulgur, and whole grain cereals. Low-sodium cereals. Corn or whole wheat flour tortillas. Whole grain cornbread. Whole grain crackers. Low-sodium crackers. Vegetables Fresh or frozen vegetables (raw, steamed, roasted, or grilled). Low-sodium or reduced-sodium tomato and vegetable juices. Low-sodium or reduced-sodium tomato sauce and paste. Low-sodium or reduced-sodium canned vegetables.  Fruits All fresh, canned (in natural juice), or frozen fruits. Meat and Other Protein Products Ground beef (85% or leaner), grass-fed beef, or beef trimmed of fat. Skinless chicken or [place]. Ground chicken or [place]. Pork trimmed of fat. All fish and seafood. Eggs. Dried beans, peas, or lentils. Unsalted nuts and seeds. Unsalted canned beans. Dairy Low-fat dairy products, such as skim or 1% milk, 2% or reduced-fat cheeses, low-fat ricotta or cottage cheese, or plain low-fat yogurt. Low-sodium or reduced-sodium cheeses. Fats and Oils Tub margarines without trans fats. Light or reduced-fat mayonnaise and salad dressings (reduced sodium). Avocado. Safflower, olive, or canola oils. Natural peanut or almond butter. Other Unsalted popcorn and pretzels. The items listed above may not be a complete list of recommended foods or beverages. Contact your dietitian for more options. WHAT FOODS ARE NOT  RECOMMENDED? Grains White bread. White pasta. White rice. Refined cornbread. Bagels and croissants. Crackers that contain trans fat. Vegetables Creamed or fried vegetables. Vegetables in a cheese sauce. Regular canned vegetables. Regular canned tomato  sauce and paste. Regular tomato and vegetable juices. Fruits Dried fruits. Canned fruit in light or heavy syrup. Fruit juice. Meat and Other Protein Products Fatty cuts of meat. Ribs, chicken wings, bacon, sausage, bologna, salami, chitterlings, fatback, hot dogs, bratwurst, and packaged luncheon meats. Salted nuts and seeds. Canned beans with salt. Dairy Whole or 2% milk, cream, half-and-half, and cream cheese. Whole-fat or sweetened yogurt. Full-fat cheeses or blue cheese. Nondairy creamers and whipped toppings. Processed cheese, cheese spreads, or cheese curds. Condiments Onion and garlic salt, seasoned salt, table salt, and sea salt. Canned and packaged gravies. Worcestershire sauce. Tartar sauce. Barbecue sauce. Teriyaki sauce. Soy sauce, including reduced sodium. Steak sauce. Fish sauce. Oyster sauce. Cocktail sauce. Horseradish. Ketchup and mustard. Meat flavorings and tenderizers. Bouillon cubes. Hot sauce. Tabasco sauce. Marinades. Taco seasonings. Relishes. Fats and Oils Butter, stick margarine, lard, shortening, ghee, and bacon fat. Coconut, palm kernel, or palm oils. Regular salad dressings. Other Pickles and olives. Salted popcorn and pretzels. The items listed above may not be a complete list of foods and beverages to avoid. Contact your dietitian for more information. WHERE CAN I FIND MORE INFORMATION? National Heart, Lung, and Blood Institute: travelstabloid.com Document Released: 09/18/2011 Document Revised: 02/13/2014 Document Reviewed: 08/03/2013 [facility] Patient Information 2015 [place], [place]. This information is not intended to replace advice given to you by your health care provider. Make sure you discuss any questions you have with your health care provider.

## 2014-07-24 NOTE — Assessment & Plan Note (Signed)
Encouraged patient to follow the ENT MD's advice and proceed with surgery.

## 2014-07-24 NOTE — Assessment & Plan Note (Signed)
Increase lisinopril to 5 mg daily. DASH diet given. Monitor BP at home.  Follow-up as scheduled.

## 2014-07-24 NOTE — Progress Notes (Signed)
Pre visit review using our clinic review tool, if applicable. No additional management support is needed unless otherwise documented below in the visit note/SLS  

## 2014-07-24 NOTE — Assessment & Plan Note (Signed)
Referral to Urology placed per patient request.

## 2014-07-28 ENCOUNTER — Other Ambulatory Visit: Payer: Self-pay | Admitting: Family

## 2014-07-28 NOTE — Telephone Encounter (Signed)
Rx request to pharmacy/SLS  

## 2014-08-01 ENCOUNTER — Encounter: Payer: Self-pay | Admitting: Physician Assistant

## 2014-08-01 ENCOUNTER — Ambulatory Visit (INDEPENDENT_AMBULATORY_CARE_PROVIDER_SITE_OTHER): Payer: BC Managed Care – PPO | Admitting: Physician Assistant

## 2014-08-01 VITALS — BP 148/74 | HR 80 | Temp 97.8°F | Resp 16 | Ht 75.0 in | Wt 271.5 lb

## 2014-08-01 DIAGNOSIS — L03116 Cellulitis of left lower limb: Secondary | ICD-10-CM

## 2014-08-01 DIAGNOSIS — Q828 Other specified congenital malformations of skin: Secondary | ICD-10-CM

## 2014-08-01 MED ORDER — HYDROCOD POLST-CHLORPHEN POLST 10-8 MG/5ML PO LQCR: 5.0000 mL | Freq: Two times a day (BID) | ORAL | Status: DC | PRN

## 2014-08-01 MED ORDER — CEPHALEXIN 500 MG PO CAPS: 500.0000 mg | ORAL_CAPSULE | Freq: Four times a day (QID) | ORAL | Status: DC

## 2014-08-01 MED ORDER — SULFAMETHOXAZOLE-TMP DS 800-160 MG PO TABS
1.0000 | ORAL_TABLET | Freq: Two times a day (BID) | ORAL | Status: DC
Start: 2014-08-01 — End: 2014-08-01

## 2014-08-01 NOTE — Assessment & Plan Note (Signed)
Rx Bactrim.  Wound care discussed.  Alarm signs/symptoms discussed.  Follow-up if no symptoms improvement with antibiotic or if symptoms worsen.

## 2014-08-01 NOTE — Progress Notes (Signed)
Pre visit review using our clinic review tool, if applicable. No additional management support is needed unless otherwise documented below in the visit note/SLS  

## 2014-08-01 NOTE — Patient Instructions (Signed)
Please take antibiotic as directed with food.  Monitor the leg wound.  If anything worsens, please come see me.  For the skin tags, the ones that were removed via excision you should keep clean, dry and apply some neosporin.  The tags that were frozen require no management.  Follow-up with me in 2 weeks.

## 2014-08-01 NOTE — Assessment & Plan Note (Signed)
Procedure consent form reviewed with patient and singed before procedure. Area prepped with iodine. Three 2-3 mm based skin tags of anterior neck removed via simple excision with scissors after injection of local anesthesia.  Multiple miniscule skin tags (10) of posterior neck were given cryotherapy treatment. Will remove axillary skin tags at follow-up in 2 weeks.  Wound care discussed with patient.

## 2014-08-01 NOTE — Progress Notes (Signed)
Patient presents to clinic today for removal of multiple skin tags of neck and axilla.  Patient was given Procedural Consent form to review and sign before procedure.  Risks discussed with patient before signature of consent form to include: risk of minor bleeding, mild pain at excision site, etc.  Patient also c/o wound to left lower leg that occurred 4 days ago when he struck leg on a tree branch while mowing. Patient up-to-date on Tetanus.  Endorses pain, tenderness, redness and warmth at site.  Denies drainage.  Denies fever, chills or malaise.  Past Medical History  Diagnosis Date  . Diabetes mellitus without complication   . Hypertension   . Hyperlipidemia   . Complication of anesthesia     has night terrors after anesthesia but can be violent when waking up  . 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 Pt stated "I forget alot of things"  . Frequent urination at night     when blood sugar runs high  . Psoriasis of scalp     nose and face; occurs mainly in winter   . Seasonal allergies   . CAD (coronary artery disease)   . Headache(784.0)   . Sinus infection 12/16/13  . ADHD (attention deficit hyperactivity disorder)   . Allergy     Current Outpatient Prescriptions on File Prior to Visit  Medication Sig Dispense Refill  . aspirin EC 325 MG tablet Take 1 tablet (325 mg total) by mouth daily.  30 tablet  0  . atorvastatin (LIPITOR) 10 MG tablet TAKE 1 TABLET (10 MG TOTAL) BY MOUTH DAILY.  30 tablet  2  . fluticasone (FLONASE) 50 MCG/ACT nasal spray Place 2 sprays into both nostrils daily.  16 g  6  . gabapentin (NEURONTIN) 300 MG capsule Takes BID      . insulin aspart (NOVOLOG FLEXPEN) 100 UNIT/ML FlexPen Inject 0-12 Units into the skin 3 (three) times daily with meals. sliding scale- check sugar and inject 3 times daily before meals as below:  <150-   Zero units 150-200 2 units 201-250 4 units 251-300 6 units 301-350 8 units 351-400 10 units >400              12 units and contact us.  15 mL  3  . insulin NPH Human (HUMULIN N,NOVOLIN N) 100 UNIT/ML injection Inject into the skin. 30 units in the morning and 30-40 units at bedtime.      Marland Kitchen lisinopril (PRINIVIL,ZESTRIL) 5 MG tablet Take 1 tablet (5 mg total) by mouth daily.  90 tablet  0  . metFORMIN (GLUCOPHAGE) 1000 MG tablet TAKE ONE TABLET BY MOUTH TWICE DAILY WITH FOOD  60 tablet  0  . Multiple Vitamins-Minerals (ZINC PO) Take 2 tablets by mouth every morning.      . Omega-3 Fatty Acids (FISH OIL) 1000 MG CAPS Take 2,000 mg by mouth 2 (two) times daily.      . sitaGLIPtin (JANUVIA) 100 MG tablet TAKE 1 TABLET BY MOUTH DAILY.  30 tablet  2   No current facility-administered medications on file prior to visit.    Allergies  Allergen Reactions  . Sulfur Itching    Family History  Problem Relation Age of Onset  . Diabetes Mother   . Cancer Mother     history breast cancer  . Hypertension Mother   . Cancer Father     prostate  . Alzheimer's disease Father   . Diabetes Father   .  Hypertension Father   . Cancer Maternal Uncle     colon  . Colon cancer Maternal Uncle 45  . Cancer Maternal Grandmother     breast  . Cancer Maternal Grandfather     prostate  . Cancer Maternal Uncle     prostate  . Colon cancer Maternal Uncle 14  . Cancer Maternal Uncle     prostate  . Cancer Cousin 47    metastatic colon cancer?  . Colon polyps Paternal Uncle   . Esophageal cancer Neg Hx   . Stomach cancer Neg Hx   . Rectal cancer Neg Hx     History   Social History  . Marital Status: Married    Spouse Name: N/A    Number of Children: 6  . Years of Education: N/A   Social History Main Topics  . Smoking status: Never Smoker   . Smokeless tobacco: Never Used  . Alcohol Use: 0.6 - 1.2 oz/week    1-2 Cans of beer per week     Comment: rare beer  . Drug Use: No  . Sexual Activity: Yes    Partners: Female   Other Topics Concern  . None   Social History Narrative   Regular exercise:  very little   Caffeine use: sweet tea daily   6 children   Works in Architect, owns concession.   Married   Enjoys televition.     Review of Systems - See HPI.  All other ROS are negative.  BP 148/74  Pulse 80  Temp(Src) 97.8 F (36.6 C) (Oral)  Resp 16  Ht _0  (1.905 m)  Wt 271 lb 8 oz (123.152 kg)  BMI 33.94 kg/m2  SpO2 98%  Physical Exam  Vitals reviewed. Constitutional: He is oriented to person, place, and time and well-developed, well-nourished, and in no distress.  HENT:  Head: Normocephalic and atraumatic.  Eyes: Conjunctivae are normal.  Neck: Neck supple.  Cardiovascular: Normal rate, regular rhythm, normal heart sounds and intact distal pulses.   Pulmonary/Chest: Effort normal and breath sounds normal. No respiratory distress. He has no wheezes. He has no rales. He exhibits no tenderness.  Neurological: He is alert and oriented to person, place, and time.  Skin: Skin is warm and dry.     Psychiatric: Affect normal.   Recent Results (from the past 2160 hour(s))  BASIC METABOLIC PANEL WITH GFR     Status: Abnormal   Collection Time    05/05/14 11:48 AM      Result Value Ref Range   Sodium 141  135 - 145 mEq/L   Potassium 4.4  3.5 - 5.3 mEq/L   Chloride 103  96 - 112 mEq/L   CO2 25  19 - 32 mEq/L   Glucose, Bld 130 (*) 70 - 99 mg/dL   BUN 6  6 - 23 mg/dL   Creat 0.75  0.50 - 1.35 mg/dL   Calcium 9.4  8.4 - 10.5 mg/dL   GFR, Est African American >89     GFR, Est Non African American >89     Comment:       The estimated GFR is a calculation valid for adults (>=43 years old)     that uses the CKD-EPI algorithm to adjust for age and sex. It is       not to be used for children, pregnant women, hospitalized patients,        patients on dialysis, or with rapidly changing kidney function.  According to the NKDEP, eGFR >89 is normal, 60-89 shows mild     impairment, 30-59 shows moderate impairment, 15-29 shows severe     impairment and <15 is ESRD.         HEMOGLOBIN A1C     Status: Abnormal   Collection Time    05/05/14 11:48 AM      Result Value Ref Range   Hemoglobin A1C 8.8 (*) <5.7 %   Comment:                                                                            According to the ADA Clinical Practice Recommendations for 2011, when     HbA1c is used as a screening test:             >=6.5%   Diagnostic of Diabetes Mellitus                (if abnormal result is confirmed)           5.7-6.4%   Increased risk of developing Diabetes Mellitus           References:Diagnosis and Classification of Diabetes Mellitus,Diabetes     BJYN,8295,62(ZHYQM 1):S62-S69 and Standards of Medical Care in             Diabetes - 2011,Diabetes Care,2011,34 (Suppl 1):S11-S61.         Mean Plasma Glucose 206 (*) <117 mg/dL  GLUCOSE, CAPILLARY     Status: Abnormal   Collection Time    05/31/14  8:15 AM      Result Value Ref Range   Glucose-Capillary 116 (*) 70 - 99 mg/dL  GLUCOSE, CAPILLARY     Status: None   Collection Time    05/31/14  9:42 AM      Result Value Ref Range   Glucose-Capillary 96  70 - 99 mg/dL    Assessment/Plan: Cellulitis of leg, left Rx Bactrim.  Wound care discussed.  Alarm signs/symptoms discussed.  Follow-up if no symptoms improvement with antibiotic or if symptoms worsen.  Accessory skin tags Procedure consent form reviewed with patient and singed before procedure. Area prepped with iodine. Three 2-3 mm based skin tags of anterior neck removed via simple excision with scissors after injection of local anesthesia.  Multiple miniscule skin tags (10) of posterior neck were given cryotherapy treatment. Will remove axillary skin tags at follow-up in 2 weeks.  Wound care discussed with patient.

## 2014-08-01 NOTE — Addendum Note (Signed)
Addended by: Raiford Noble on: 08/01/2014 09:25 AM   Modules accepted: Orders

## 2014-08-15 ENCOUNTER — Encounter: Payer: Self-pay | Admitting: Physician Assistant

## 2014-08-15 ENCOUNTER — Ambulatory Visit (INDEPENDENT_AMBULATORY_CARE_PROVIDER_SITE_OTHER): Payer: BC Managed Care – PPO | Admitting: Physician Assistant

## 2014-08-15 VITALS — BP 130/78 | HR 79 | Temp 97.7°F | Resp 16 | Ht 75.0 in | Wt 270.1 lb

## 2014-08-15 DIAGNOSIS — Q828 Other specified congenital malformations of skin: Secondary | ICD-10-CM

## 2014-08-15 MED ORDER — GABAPENTIN 300 MG PO CAPS: 300.0000 mg | ORAL_CAPSULE | Freq: Two times a day (BID) | ORAL | Status: DC

## 2014-08-15 NOTE — Progress Notes (Signed)
Patient presents to clinic today for skin tag removal.  Patient with skin tags of axilla bilaterally and one large remnant tag of left posterior neck.  Patient gave verbal consent prior to procedure. Total of 3 tags will be removed via simple excision today.  Past Medical History  Diagnosis Date  . Diabetes mellitus without complication   . Hypertension   . Hyperlipidemia   . Complication of anesthesia     has night terrors after anesthesia but can be violent when waking up  . 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 Pt stated "I forget alot of things"  . Frequent urination at night     when blood sugar runs high  . Psoriasis of scalp     nose and face; occurs mainly in winter   . Seasonal allergies   . CAD (coronary artery disease)   . Headache(784.0)   . Sinus infection 12/16/13  . ADHD (attention deficit hyperactivity disorder)   . Allergy     Current Outpatient Prescriptions on File Prior to Visit  Medication Sig Dispense Refill  . aspirin EC 325 MG tablet Take 1 tablet (325 mg total) by mouth daily. 30 tablet 0  . atorvastatin (LIPITOR) 10 MG tablet TAKE 1 TABLET (10 MG TOTAL) BY MOUTH DAILY. 30 tablet 2  . fluticasone (FLONASE) 50 MCG/ACT nasal spray Place 2 sprays into both nostrils daily. 16 g 6  . insulin aspart (NOVOLOG FLEXPEN) 100 UNIT/ML FlexPen Inject 0-12 Units into the skin 3 (three) times daily with meals. sliding scale- check sugar and inject 3 times daily before meals as below:  <150-   Zero units 150-200 2 units 201-250 4 units 251-300 6 units 301-350 8 units 351-400 10 units >400             12 units and contact us. 15 mL 3  . insulin NPH Human (HUMULIN N,NOVOLIN N) 100 UNIT/ML injection Inject into the skin. 30 units in the morning and 30-40 units at bedtime.    Marland Kitchen lisinopril (PRINIVIL,ZESTRIL) 5 MG tablet Take 1 tablet (5 mg total) by mouth daily. 90 tablet 0  . metFORMIN (GLUCOPHAGE) 1000 MG tablet TAKE ONE TABLET BY MOUTH TWICE DAILY WITH  FOOD 60 tablet 0  . Multiple Vitamins-Minerals (ZINC PO) Take 2 tablets by mouth every morning.    . Omega-3 Fatty Acids (FISH OIL) 1000 MG CAPS Take 2,000 mg by mouth 2 (two) times daily.    . sitaGLIPtin (JANUVIA) 100 MG tablet TAKE 1 TABLET BY MOUTH DAILY. 30 tablet 2   No current facility-administered medications on file prior to visit.    Allergies  Allergen Reactions  . Sulfur Itching  . Sulfa Antibiotics Itching    Family History  Problem Relation Age of Onset  . Diabetes Mother   . Cancer Mother     history breast cancer  . Hypertension Mother   . Cancer Father     prostate  . Alzheimer's disease Father   . Diabetes Father   . Hypertension Father   . Cancer Maternal Uncle     colon  . Colon cancer Maternal Uncle 2  . Cancer Maternal Grandmother     breast  . Cancer Maternal Grandfather     prostate  . Cancer Maternal Uncle     prostate  . Colon cancer Maternal Uncle 25  . Cancer Maternal Uncle     prostate  . Cancer Cousin 47    metastatic colon  cancer?  . Colon polyps Paternal Uncle   . Esophageal cancer Neg Hx   . Stomach cancer Neg Hx   . Rectal cancer Neg Hx     History   Social History  . Marital Status: Married    Spouse Name: N/A    Number of Children: 6  . Years of Education: N/A   Social History Main Topics  . Smoking status: Never Smoker   . Smokeless tobacco: Never Used  . Alcohol Use: 0.6 - 1.2 oz/week    1-2 Cans of beer per week     Comment: rare beer  . Drug Use: No  . Sexual Activity:    Partners: Female   Other Topics Concern  . None   Social History Narrative   Regular exercise: very little   Caffeine use: sweet tea daily   6 children   Works in Architect, owns concession.   Married   Enjoys televition.     Review of Systems - See HPI.  All other ROS are negative.  BP 130/78 mmHg  Pulse 79  Temp(Src) 97.7 F (36.5 C) (Oral)  Resp 16  Ht 6\' 3"  (1.905 m)  Wt 270 lb 2 oz (122.528 kg)  BMI 33.76 kg/m2  SpO2  99%  Physical Exam  Constitutional: He is oriented to person, place, and time and well-developed, well-nourished, and in no distress.  HENT:  Head: Normocephalic and atraumatic.  Eyes: Conjunctivae are normal.  Cardiovascular: Normal rate, regular rhythm, normal heart sounds and intact distal pulses.   Pulmonary/Chest: Effort normal and breath sounds normal. No respiratory distress. He has no wheezes. He has no rales. He exhibits no tenderness.  Neurological: He is alert and oriented to person, place, and time.  Skin: Skin is warm and dry. No rash noted.  Psychiatric: Affect normal.  Vitals reviewed.  Recent Results (from the past 2160 hour(s))  Glucose, capillary     Status: Abnormal   Collection Time: 05/31/14  8:15 AM  Result Value Ref Range   Glucose-Capillary 116 (H) 70 - 99 mg/dL  Glucose, capillary     Status: None   Collection Time: 05/31/14  9:42 AM  Result Value Ref Range   Glucose-Capillary 96 70 - 99 mg/dL    Assessment/Plan: Accessory skin tags 3 tags of concern -- left anterior axilla, right lower axilla and left posterior neck were removed via simple excision.  Areas were prepped with Betadine.  Local anesthesia with 1% lidocaine with epinephrine injected to the base of each tag.  Tags removed via excision with 11 blade scalpel.  Minimal bleeding.  Bandages applied.  Home care instructions given to patient.

## 2014-08-15 NOTE — Progress Notes (Signed)
Pre visit review using our clinic review tool, if applicable. No additional management support is needed unless otherwise documented below in the visit note/SLS  

## 2014-08-15 NOTE — Patient Instructions (Addendum)
Please keep areas clean and dry.  You may also apply some topical neosporin.  Continue other medications as directed.  Follow-up with me in December regarding your diabetes and blood pressure.

## 2014-08-16 NOTE — Assessment & Plan Note (Signed)
3 tags of concern -- left anterior axilla, right lower axilla and left posterior neck were removed via simple excision.  Areas were prepped with Betadine.  Local anesthesia with 1% lidocaine with epinephrine injected to the base of each tag.  Tags removed via excision with 11 blade scalpel.  Minimal bleeding.  Bandages applied.  Home care instructions given to patient.

## 2014-08-25 ENCOUNTER — Telehealth: Payer: Self-pay | Admitting: Physician Assistant

## 2014-08-25 ENCOUNTER — Ambulatory Visit (INDEPENDENT_AMBULATORY_CARE_PROVIDER_SITE_OTHER): Payer: BC Managed Care – PPO | Admitting: Physician Assistant

## 2014-08-25 ENCOUNTER — Other Ambulatory Visit: Payer: Self-pay | Admitting: Physician Assistant

## 2014-08-25 NOTE — Telephone Encounter (Signed)
Pt came in early hoping to be seen early.  No earlier appt available, pt rescheduled.

## 2014-08-30 ENCOUNTER — Other Ambulatory Visit: Payer: Self-pay | Admitting: Family

## 2014-08-30 ENCOUNTER — Other Ambulatory Visit: Payer: Self-pay | Admitting: Physician Assistant

## 2014-08-30 NOTE — Telephone Encounter (Signed)
Medication Detail      Disp Refills Start End     metFORMIN (GLUCOPHAGE) 1000 MG tablet 60 tablet 0 08/25/2014     Sig: TAKE ONE TABLET BY MOUTH TWICE DAILY WITH FOOD    E-Prescribing Status: Receipt confirmed by pharmacy (08/25/2014 9:31 AM EST)     Pharmacy    [facility] [place] - [address]   Phoned pharmacy, per [place] this request was sent in error; advised will cancel as they have prior r\Rx sent ready for patient pick-up/SLS

## 2014-09-01 ENCOUNTER — Ambulatory Visit: Payer: BC Managed Care – PPO | Admitting: Physician Assistant

## 2014-09-01 ENCOUNTER — Telehealth: Payer: Self-pay | Admitting: *Deleted

## 2014-09-01 NOTE — Telephone Encounter (Signed)
Pt did not show for appointment 09/01/2014 at 10:45 for 1 month follow up.  Pt has another appointment scheduled 09/15/2014 for "recheck diabetes and BP check"

## 2014-09-15 ENCOUNTER — Ambulatory Visit: Payer: BC Managed Care – PPO | Admitting: Physician Assistant

## 2014-09-28 ENCOUNTER — Other Ambulatory Visit: Payer: Self-pay | Admitting: Physician Assistant

## 2014-09-28 ENCOUNTER — Other Ambulatory Visit: Payer: Self-pay | Admitting: Family

## 2014-09-28 NOTE — Telephone Encounter (Signed)
Metformin Last filled:  08/25/14 Amt: 60, 0 refills Last OV:  07/24/14  Med filled.

## 2014-10-26 ENCOUNTER — Encounter (HOSPITAL_COMMUNITY): Payer: Self-pay | Admitting: Orthopedic Surgery

## 2014-11-01 ENCOUNTER — Ambulatory Visit (INDEPENDENT_AMBULATORY_CARE_PROVIDER_SITE_OTHER): Payer: 59 | Admitting: Physician Assistant

## 2014-11-01 ENCOUNTER — Encounter: Payer: Self-pay | Admitting: Physician Assistant

## 2014-11-01 VITALS — BP 154/88 | HR 108 | Temp 98.0°F | Resp 16 | Ht 75.0 in | Wt 268.2 lb

## 2014-11-01 DIAGNOSIS — E119 Type 2 diabetes mellitus without complications: Secondary | ICD-10-CM

## 2014-11-01 DIAGNOSIS — J329 Chronic sinusitis, unspecified: Secondary | ICD-10-CM

## 2014-11-01 DIAGNOSIS — J019 Acute sinusitis, unspecified: Secondary | ICD-10-CM

## 2014-11-01 DIAGNOSIS — I10 Essential (primary) hypertension: Secondary | ICD-10-CM

## 2014-11-01 DIAGNOSIS — M170 Bilateral primary osteoarthritis of knee: Secondary | ICD-10-CM

## 2014-11-01 DIAGNOSIS — B9689 Other specified bacterial agents as the cause of diseases classified elsewhere: Secondary | ICD-10-CM

## 2014-11-01 MED ORDER — ATORVASTATIN CALCIUM 10 MG PO TABS: ORAL_TABLET | ORAL | Status: DC

## 2014-11-01 MED ORDER — INSULIN NPH (HUMAN) (ISOPHANE) 100 UNIT/ML ~~LOC~~ SUSP: SUBCUTANEOUS | Status: DC

## 2014-11-01 MED ORDER — METFORMIN HCL 1000 MG PO TABS: 1000.0000 mg | ORAL_TABLET | Freq: Two times a day (BID) | ORAL | Status: DC

## 2014-11-01 MED ORDER — LISINOPRIL 5 MG PO TABS: 5.0000 mg | ORAL_TABLET | Freq: Every day | ORAL | Status: DC

## 2014-11-01 MED ORDER — HYDROCOD POLST-CHLORPHEN POLST 10-8 MG/5ML PO LQCR: 5.0000 mL | Freq: Two times a day (BID) | ORAL | Status: DC | PRN

## 2014-11-01 MED ORDER — SITAGLIPTIN PHOSPHATE 100 MG PO TABS: ORAL_TABLET | ORAL | Status: DC

## 2014-11-01 MED ORDER — INSULIN ASPART 100 UNIT/ML FLEXPEN: 0.0000 [IU] | PEN_INJECTOR | Freq: Three times a day (TID) | SUBCUTANEOUS | Status: DC

## 2014-11-01 MED ORDER — AMOXICILLIN-POT CLAVULANATE 875-125 MG PO TABS: 1.0000 | ORAL_TABLET | Freq: Two times a day (BID) | ORAL | Status: DC

## 2014-11-01 MED ORDER — GABAPENTIN 300 MG PO CAPS: 300.0000 mg | ORAL_CAPSULE | Freq: Two times a day (BID) | ORAL | Status: DC

## 2014-11-01 NOTE — Progress Notes (Signed)
Pre visit review using our clinic review tool, if applicable. No additional management support is needed unless otherwise documented below in the visit note/SLS  

## 2014-11-01 NOTE — Progress Notes (Signed)
History of Present Illness: Steve [name] is a 54 y.o. male who present to the clinic today complaining of sinus pressure x 6 days. Now with significant sinus pain, ear pain, tooth pain and nasal congestion worsening over the past 2 days. Patient endorses ST and cough now productive of yellow phlegm.  Patient denies fever or aches.  Patient with history of chronic sinusitis with frequent acute bacterial sinusitis.  Patient is followed by ENT for this.  History: Past Medical History  Diagnosis Date  . Diabetes mellitus without complication   . Hypertension   . Hyperlipidemia   . Complication of anesthesia     has night terrors after anesthesia but can be violent when waking up  . 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 Pt stated "I forget alot of things"  . Frequent urination at night     when blood sugar runs high  . Psoriasis of scalp     nose and face; occurs mainly in winter   . Seasonal allergies   . CAD (coronary artery disease)   . Headache(784.0)   . Sinus infection 12/16/13  . ADHD (attention deficit hyperactivity disorder)   . Allergy     Current outpatient prescriptions:  .  aspirin EC 325 MG tablet, Take 1 tablet (325 mg total) by mouth daily., Disp: 30 tablet, Rfl: 0 .  atorvastatin (LIPITOR) 10 MG tablet, TAKE 1 TABLET (10 MG TOTAL) BY MOUTH DAILY., Disp: 30 tablet, Rfl: 2 .  fluticasone (FLONASE) 50 MCG/ACT nasal spray, Place 2 sprays into both nostrils daily., Disp: 16 g, Rfl: 6 .  gabapentin (NEURONTIN) 300 MG capsule, Take 1 capsule (300 mg total) by mouth 2 (two) times daily. Takes BID, Disp: 60 capsule, Rfl: 1 .  insulin aspart (NOVOLOG FLEXPEN) 100 UNIT/ML FlexPen, Inject 0-12 Units into the skin 3 (three) times daily with meals. sliding scale- check sugar and inject 3 times daily before meals as below:  <150-   Zero units 150-200 2 units 201-250 4 units 251-300 6 units 301-350 8 units 351-400 10 units >400             12 units and contact us., Disp:  15 mL, Rfl: 3 .  insulin NPH Human (HUMULIN N,NOVOLIN N) 100 UNIT/ML injection, 30 units in the morning and 30-40 units at bedtime., Disp: 10 mL, Rfl: 2 .  lisinopril (PRINIVIL,ZESTRIL) 5 MG tablet, Take 1 tablet (5 mg total) by mouth daily., Disp: 90 tablet, Rfl: 0 .  metFORMIN (GLUCOPHAGE) 1000 MG tablet, Take 1 tablet (1,000 mg total) by mouth 2 (two) times daily with a meal., Disp: 60 tablet, Rfl: 3 .  Multiple Vitamins-Minerals (ZINC PO), Take 2 tablets by mouth every morning., Disp: , Rfl:  .  Omega-3 Fatty Acids (FISH OIL) 1000 MG CAPS, Take 2,000 mg by mouth 2 (two) times daily., Disp: , Rfl:  .  sitaGLIPtin (JANUVIA) 100 MG tablet, TAKE 1 TABLET BY MOUTH DAILY., Disp: 30 tablet, Rfl: 2 .  amoxicillin-clavulanate (AUGMENTIN) 875-125 MG per tablet, Take 1 tablet by mouth 2 (two) times daily., Disp: 20 tablet, Rfl: 0 .  chlorpheniramine-HYDROcodone (TUSSIONEX PENNKINETIC ER) 10-8 MG/5ML LQCR, Take 5 mLs by mouth every 12 (twelve) hours as needed., Disp: 115 mL, Rfl: 0 Allergies  Allergen Reactions  . Sulfur Itching  . Sulfa Antibiotics Itching   Family History  Problem Relation Age of Onset  . Diabetes Mother   . Cancer Mother  history breast cancer  . Hypertension Mother   . Cancer Father     prostate  . Alzheimer's disease Father   . Diabetes Father   . Hypertension Father   . Cancer Maternal Uncle     colon  . Colon cancer Maternal Uncle 83  . Cancer Maternal Grandmother     breast  . Cancer Maternal Grandfather     prostate  . Cancer Maternal Uncle     prostate  . Colon cancer Maternal Uncle 7  . Cancer Maternal Uncle     prostate  . Cancer Cousin 47    metastatic colon cancer?  . Colon polyps Paternal Uncle   . Esophageal cancer Neg Hx   . Stomach cancer Neg Hx   . Rectal cancer Neg Hx    History   Social History  . Marital Status: Married    Spouse Name: N/A    Number of Children: 6  . Years of Education: N/A   Social History Main Topics  . Smoking  status: Never Smoker   . Smokeless tobacco: Never Used  . Alcohol Use: 0.6 - 1.2 oz/week    1-2 Cans of beer per week     Comment: rare beer  . Drug Use: No  . Sexual Activity:    Partners: Female   Other Topics Concern  . None   Social History Narrative   Regular exercise: very little   Caffeine use: sweet tea daily   6 children   Works in Architect, owns concession.   Married   Enjoys televition.     Review of Systems: See HPI.  All other ROS are negative.  Physical Examination: BP 154/88 mmHg  Pulse 108  Temp(Src) 98 F (36.7 C) (Oral)  Resp 16  Ht 6\' 3"  (1.905 m)  Wt 268 lb 4 oz (121.677 kg)  BMI 33.53 kg/m2  SpO2 99%  General appearance: alert, cooperative and appears stated age Head: Normocephalic, without obvious abnormality, atraumatic, sinuses tender to percussion Eyes: conjunctivae/corneas clear. PERRL, EOM's intact. Fundi benign. Ears: normal TM's and external ear canals both ears Nose: moderate congestion, turbinates swollen, sinus tenderness bilateral Throat: lips, mucosa, and tongue normal; teeth and gums normal Neck: no adenopathy, no carotid bruit, no JVD, supple, symmetrical, trachea midline and thyroid not enlarged, symmetric, no tenderness/mass/nodules Lungs: clear to auscultation bilaterally Chest wall: no tenderness  Assessment/Plan: Acute bacterial sinusitis Rx Augmentin.  Increase fluids.  Rest.  Saline nasal spray.  Probiotic.  Mucinex as directed.  Humidifier in bedroom. .  Call or return to clinic if symptoms are not improving.

## 2014-11-01 NOTE — Patient Instructions (Signed)
Please take antibiotic as directed.  Increase fluid intake.  Use Saline nasal spray.  Take a daily multivitamin. Plain Mucinex.  Place a humidifier in the bedroom.  Please call or return clinic if symptoms are not improving.  Sinusitis Sinusitis is redness, soreness, and swelling (inflammation) of the paranasal sinuses. Paranasal sinuses are air pockets within the bones of your face (beneath the eyes, the middle of the forehead, or above the eyes). In healthy paranasal sinuses, mucus is able to drain out, and air is able to circulate through them by way of your nose. However, when your paranasal sinuses are inflamed, mucus and air can become trapped. This can allow bacteria and other germs to grow and cause infection. Sinusitis can develop quickly and last only a short time (acute) or continue over a long period (chronic). Sinusitis that lasts for more than 12 weeks is considered chronic.  CAUSES  Causes of sinusitis include:  Allergies.  Structural abnormalities, such as displacement of the cartilage that separates your nostrils (deviated septum), which can decrease the air flow through your nose and sinuses and affect sinus drainage.  Functional abnormalities, such as when the small hairs (cilia) that line your sinuses and help remove mucus do not work properly or are not present. SYMPTOMS  Symptoms of acute and chronic sinusitis are the same. The primary symptoms are pain and pressure around the affected sinuses. Other symptoms include:  Upper toothache.  Earache.  Headache.  Bad breath.  Decreased sense of smell and taste.  A cough, which worsens when you are lying flat.  Fatigue.  Fever.  Thick drainage from your nose, which often is green and may contain pus (purulent).  Swelling and warmth over the affected sinuses. DIAGNOSIS  Your caregiver will perform a physical exam. During the exam, your caregiver may:  Look in your nose for signs of abnormal growths in your nostrils  (nasal polyps).  Tap over the affected sinus to check for signs of infection.  View the inside of your sinuses (endoscopy) with a special imaging device with a light attached (endoscope), which is inserted into your sinuses. If your caregiver suspects that you have chronic sinusitis, one or more of the following tests may be recommended:  Allergy tests.  Nasal culture A sample of mucus is taken from your nose and sent to a lab and screened for bacteria.  Nasal cytology A sample of mucus is taken from your nose and examined by your caregiver to determine if your sinusitis is related to an allergy. TREATMENT  Most cases of acute sinusitis are related to a viral infection and will resolve on their own within 10 days. Sometimes medicines are prescribed to help relieve symptoms (pain medicine, decongestants, nasal steroid sprays, or saline sprays).  However, for sinusitis related to a bacterial infection, your caregiver will prescribe antibiotic medicines. These are medicines that will help kill the bacteria causing the infection.  Rarely, sinusitis is caused by a fungal infection. In theses cases, your caregiver will prescribe antifungal medicine. For some cases of chronic sinusitis, surgery is needed. Generally, these are cases in which sinusitis recurs more than 3 times per year, despite other treatments. HOME CARE INSTRUCTIONS   Drink plenty of water. Water helps thin the mucus so your sinuses can drain more easily.  Use a humidifier.  Inhale steam 3 to 4 times a day (for example, sit in the bathroom with the shower running).  Apply a warm, moist washcloth to your face 3 to 4 times  a day, or as directed by your caregiver.  Use saline nasal sprays to help moisten and clean your sinuses.  Take over-the-counter or prescription medicines for pain, discomfort, or fever only as directed by your caregiver. SEEK IMMEDIATE MEDICAL CARE IF:  You have increasing pain or severe headaches.  You  have nausea, vomiting, or drowsiness.  You have swelling around your face.  You have vision problems.  You have a stiff neck.  You have difficulty breathing. MAKE SURE YOU:   Understand these instructions.  Will watch your condition.  Will get help right away if you are not doing well or get worse. Document Released: 09/29/2005 Document Revised: 12/22/2011 Document Reviewed: 10/14/2011 [facility] Patient Information 2014 [place], [place].

## 2014-11-01 NOTE — Assessment & Plan Note (Signed)
Rx Augmentin.  Increase fluids.  Rest.  Saline nasal spray.  Probiotic.  Mucinex as directed.  Humidifier in bedroom.  Call or return to clinic if symptoms are not improving.  

## 2014-11-06 LAB — HM DIABETES EYE EXAM

## 2014-11-15 ENCOUNTER — Ambulatory Visit (INDEPENDENT_AMBULATORY_CARE_PROVIDER_SITE_OTHER): Payer: 59 | Admitting: Physician Assistant

## 2014-11-15 ENCOUNTER — Encounter: Payer: Self-pay | Admitting: Physician Assistant

## 2014-11-15 VITALS — BP 134/80 | HR 108 | Temp 98.5°F | Resp 16 | Ht 75.0 in | Wt 276.4 lb

## 2014-11-15 DIAGNOSIS — J0111 Acute recurrent frontal sinusitis: Secondary | ICD-10-CM

## 2014-11-15 MED ORDER — DOXYCYCLINE MONOHYDRATE 100 MG PO CAPS: 100.0000 mg | ORAL_CAPSULE | Freq: Two times a day (BID) | ORAL | Status: DC

## 2014-11-15 MED ORDER — HYDROCOD POLST-CHLORPHEN POLST 10-8 MG/5ML PO LQCR
5.0000 mL | Freq: Two times a day (BID) | ORAL | Status: DC | PRN
Start: 2014-11-15 — End: 2015-03-23

## 2014-11-15 NOTE — Progress Notes (Signed)
Pre visit review using our clinic review tool, if applicable. No additional management support is needed unless otherwise documented below in the visit note/SLS  

## 2014-11-15 NOTE — Progress Notes (Signed)
  Subjective:     Steve [name] is a 54 y.o. male who presents for evaluation of symptoms of a URI, follow up on sinusitis. Symptoms include bilateral ear pressure/pain, congestion, facial pain, low grade fever, nasal congestion, productive cough with  yellow colored sputum, sinus pressure, sore throat and tooth pain. Onset of symptoms was 3 weeks ago, and has been gradually worsening since that time. Treatment to date: antibiotics.  The following portions of the patient's history were reviewed and updated as appropriate: allergies, current medications, past family history, past medical history, past social history, past surgical history and problem list.  Review of Systems Pertinent items are noted in HPI.   Objective:    BP 134/80 mmHg  Pulse 108  Temp(Src) 98.5 F (36.9 C) (Oral)  Resp 16  Ht 6\' 3"  (1.905 m)  Wt 276 lb 6 oz (125.363 kg)  BMI 34.54 kg/m2  SpO2 96% General appearance: alert, cooperative, appears stated age and no distress Head: Normocephalic, without obvious abnormality, atraumatic Ears: normal TM's and external ear canals both ears Nose: green discharge, moderate congestion, sinus tenderness bilateral Throat: lips, mucosa, and tongue normal; teeth and gums normal Lungs: clear to auscultation bilaterally Heart: regular rate and rhythm, S1, S2 normal, no murmur, click, rub or gallop   Assessment:    sinusitis   Plan:    Discussed the diagnosis and treatment of sinusitis. Nasal saline spray for congestion. Doxycycline per orders. Nasal steroids per orders. Follow up as needed.

## 2014-11-15 NOTE — Patient Instructions (Signed)
Please take antibiotic as directed.  Increase fluid intake.  Use Saline nasal spray.  Take a daily multivitamin. Use Tussionex for cough and FLonase for ear pressure.  Place a humidifier in the bedroom.  Please call or return clinic if symptoms are not improving.  Sinusitis Sinusitis is redness, soreness, and swelling (inflammation) of the paranasal sinuses. Paranasal sinuses are air pockets within the bones of your face (beneath the eyes, the middle of the forehead, or above the eyes). In healthy paranasal sinuses, mucus is able to drain out, and air is able to circulate through them by way of your nose. However, when your paranasal sinuses are inflamed, mucus and air can become trapped. This can allow bacteria and other germs to grow and cause infection. Sinusitis can develop quickly and last only a short time (acute) or continue over a long period (chronic). Sinusitis that lasts for more than 12 weeks is considered chronic.  CAUSES  Causes of sinusitis include:  Allergies.  Structural abnormalities, such as displacement of the cartilage that separates your nostrils (deviated septum), which can decrease the air flow through your nose and sinuses and affect sinus drainage.  Functional abnormalities, such as when the small hairs (cilia) that line your sinuses and help remove mucus do not work properly or are not present. SYMPTOMS  Symptoms of acute and chronic sinusitis are the same. The primary symptoms are pain and pressure around the affected sinuses. Other symptoms include:  Upper toothache.  Earache.  Headache.  Bad breath.  Decreased sense of smell and taste.  A cough, which worsens when you are lying flat.  Fatigue.  Fever.  Thick drainage from your nose, which often is green and may contain pus (purulent).  Swelling and warmth over the affected sinuses. DIAGNOSIS  Your caregiver will perform a physical exam. During the exam, your caregiver may:  Look in your nose for  signs of abnormal growths in your nostrils (nasal polyps).  Tap over the affected sinus to check for signs of infection.  View the inside of your sinuses (endoscopy) with a special imaging device with a light attached (endoscope), which is inserted into your sinuses. If your caregiver suspects that you have chronic sinusitis, one or more of the following tests may be recommended:  Allergy tests.  Nasal culture A sample of mucus is taken from your nose and sent to a lab and screened for bacteria.  Nasal cytology A sample of mucus is taken from your nose and examined by your caregiver to determine if your sinusitis is related to an allergy. TREATMENT  Most cases of acute sinusitis are related to a viral infection and will resolve on their own within 10 days. Sometimes medicines are prescribed to help relieve symptoms (pain medicine, decongestants, nasal steroid sprays, or saline sprays).  However, for sinusitis related to a bacterial infection, your caregiver will prescribe antibiotic medicines. These are medicines that will help kill the bacteria causing the infection.  Rarely, sinusitis is caused by a fungal infection. In theses cases, your caregiver will prescribe antifungal medicine. For some cases of chronic sinusitis, surgery is needed. Generally, these are cases in which sinusitis recurs more than 3 times per year, despite other treatments. HOME CARE INSTRUCTIONS   Drink plenty of water. Water helps thin the mucus so your sinuses can drain more easily.  Use a humidifier.  Inhale steam 3 to 4 times a day (for example, sit in the bathroom with the shower running).  Apply a warm, moist washcloth  to your face 3 to 4 times a day, or as directed by your caregiver.  Use saline nasal sprays to help moisten and clean your sinuses.  Take over-the-counter or prescription medicines for pain, discomfort, or fever only as directed by your caregiver. SEEK IMMEDIATE MEDICAL CARE IF:  You have  increasing pain or severe headaches.  You have nausea, vomiting, or drowsiness.  You have swelling around your face.  You have vision problems.  You have a stiff neck.  You have difficulty breathing. MAKE SURE YOU:   Understand these instructions.  Will watch your condition.  Will get help right away if you are not doing well or get worse. Document Released: 09/29/2005 Document Revised: 12/22/2011 Document Reviewed: 10/14/2011 [facility] Patient Information 2014 [place], [place].

## 2014-11-23 ENCOUNTER — Telehealth: Payer: Self-pay | Admitting: Physician Assistant

## 2014-11-23 NOTE — Telephone Encounter (Signed)
Unable to do referral pt was seen on 11/07/13. Can not back date referral

## 2014-11-23 NOTE — Telephone Encounter (Signed)
Caller name: kim from [place] Relation to pt: Call back number: [phone] Pharmacy:  Reason for call:     [facility] is calling and needing a referral for patient. [facility]

## 2014-12-04 ENCOUNTER — Telehealth: Payer: Self-pay | Admitting: Physician Assistant

## 2014-12-04 NOTE — Telephone Encounter (Signed)
LMOM with contact name and number RE: requested refills; verified with pharmacy that all Rx have available refills and they will get those ready for pick-up/SLS

## 2014-12-04 NOTE — Telephone Encounter (Signed)
PLEASE SEND REFILLS TO [facility] ON [address]  FOR jANUVIA 100 MG LIPITOR 10MG  NEURONTIN 300 MG PRINIVIL ZESTRIL 1000MG  AND GLUCOPHAGE 1000 MG

## 2014-12-07 ENCOUNTER — Telehealth: Payer: Self-pay | Admitting: *Deleted

## 2014-12-07 NOTE — Telephone Encounter (Signed)
Prior authorization for Januvia initiated. Awaiting determination. JG//CMA

## 2014-12-26 ENCOUNTER — Other Ambulatory Visit: Payer: Self-pay | Admitting: Physician Assistant

## 2014-12-26 ENCOUNTER — Encounter: Payer: Self-pay | Admitting: Physician Assistant

## 2015-01-11 ENCOUNTER — Other Ambulatory Visit: Payer: Self-pay | Admitting: *Deleted

## 2015-01-11 DIAGNOSIS — I10 Essential (primary) hypertension: Secondary | ICD-10-CM

## 2015-01-11 MED ORDER — LISINOPRIL 5 MG PO TABS: 5.0000 mg | ORAL_TABLET | Freq: Every day | ORAL | Status: DC

## 2015-01-11 NOTE — Telephone Encounter (Signed)
Rx sent to the pharmacy by e-script.//AB/CMA 

## 2015-02-05 ENCOUNTER — Other Ambulatory Visit: Payer: Self-pay | Admitting: Physician Assistant

## 2015-03-12 ENCOUNTER — Other Ambulatory Visit: Payer: Self-pay | Admitting: Physician Assistant

## 2015-03-23 ENCOUNTER — Ambulatory Visit (INDEPENDENT_AMBULATORY_CARE_PROVIDER_SITE_OTHER): Payer: 59 | Admitting: Family Medicine

## 2015-03-23 ENCOUNTER — Encounter: Payer: Self-pay | Admitting: Family Medicine

## 2015-03-23 VITALS — BP 146/90 | HR 92 | Temp 97.6°F | Ht 75.0 in | Wt 274.6 lb

## 2015-03-23 DIAGNOSIS — R208 Other disturbances of skin sensation: Secondary | ICD-10-CM | POA: Diagnosis not present

## 2015-03-23 DIAGNOSIS — E1140 Type 2 diabetes mellitus with diabetic neuropathy, unspecified: Secondary | ICD-10-CM | POA: Diagnosis not present

## 2015-03-23 DIAGNOSIS — I10 Essential (primary) hypertension: Secondary | ICD-10-CM

## 2015-03-23 DIAGNOSIS — R200 Anesthesia of skin: Secondary | ICD-10-CM

## 2015-03-23 LAB — BASIC METABOLIC PANEL
BUN: 11 mg/dL (ref 6–23)
CO2: 21 mEq/L (ref 19–32)
Calcium: 9.1 mg/dL (ref 8.4–10.5)
Chloride: 104 mEq/L (ref 96–112)
Creatinine, Ser: 0.86 mg/dL (ref 0.40–1.50)
GFR: 99.27 mL/min (ref >60.00)
GLUCOSE: 205 mg/dL — AB (ref 70–99)
Potassium: 4.2 mEq/L (ref 3.5–5.1)
Sodium: 139 mEq/L (ref 135–145)

## 2015-03-23 LAB — CBC WITH DIFFERENTIAL/PLATELET
BASOS ABS: 0 10*3/uL (ref 0.0–0.1)
Basophils Relative: 0.3 % (ref 0.0–3.0)
EOS PCT: 1.7 % (ref 0.0–5.0)
Eosinophils Absolute: 0.1 10*3/uL (ref 0.0–0.7)
HCT: 40.4 % (ref 39.0–52.0)
Hemoglobin: 13.2 g/dL (ref 13.0–17.0)
Lymphocytes Relative: 28.5 % (ref 12.0–46.0)
Lymphs Abs: 1.6 10*3/uL (ref 0.7–4.0)
MCHC: 32.8 g/dL (ref 30.0–36.0)
MCV: 79.3 fl (ref 78.0–100.0)
Monocytes Absolute: 0.3 10*3/uL (ref 0.1–1.0)
Monocytes Relative: 6 % (ref 3.0–12.0)
NEUTROS ABS: 3.6 10*3/uL (ref 1.4–7.7)
Neutrophils Relative %: 63.5 % (ref 43.0–77.0)
Platelets: 263 10*3/uL (ref 150.0–400.0)
RBC: 5.09 Mil/uL (ref 4.22–5.81)
RDW: 14.1 % (ref 11.5–15.5)
WBC: 5.6 10*3/uL (ref 4.0–10.5)

## 2015-03-23 LAB — LIPID PANEL
Cholesterol: 139 mg/dL (ref 0–200)
HDL: 32.6 mg/dL — ABNORMAL LOW (ref >39.00)
TRIGLYCERIDES: 504 mg/dL — AB (ref 0.0–149.0)
Total CHOL/HDL Ratio: 4

## 2015-03-23 LAB — HEPATIC FUNCTION PANEL
ALT: 36 U/L (ref 0–53)
AST: 29 U/L (ref 0–37)
Albumin: 4.2 g/dL (ref 3.5–5.2)
Alkaline Phosphatase: 82 U/L (ref 39–117)
BILIRUBIN DIRECT: 0 mg/dL (ref 0.0–0.3)
Total Bilirubin: 0.4 mg/dL (ref 0.2–1.2)
Total Protein: 6.7 g/dL (ref 6.0–8.3)

## 2015-03-23 LAB — B12 AND FOLATE PANEL
FOLATE: 24.4 ng/mL (ref >5.9)
Vitamin B-12: 206 pg/mL — ABNORMAL LOW (ref 211–911)

## 2015-03-23 LAB — HEMOGLOBIN A1C: Hgb A1c MFr Bld: 9.4 % — ABNORMAL HIGH (ref 4.6–6.5)

## 2015-03-23 LAB — LDL CHOLESTEROL, DIRECT: LDL DIRECT: 52 mg/dL

## 2015-03-23 LAB — TSH: TSH: 1.2 u[IU]/mL (ref 0.35–4.50)

## 2015-03-23 MED ORDER — LISINOPRIL 5 MG PO TABS: 5.0000 mg | ORAL_TABLET | Freq: Every day | ORAL | Status: DC

## 2015-03-23 MED ORDER — SITAGLIPTIN PHOSPHATE 100 MG PO TABS: ORAL_TABLET | ORAL | Status: DC

## 2015-03-23 MED ORDER — GABAPENTIN 300 MG PO CAPS: 300.0000 mg | ORAL_CAPSULE | Freq: Three times a day (TID) | ORAL | Status: DC

## 2015-03-23 NOTE — Patient Instructions (Signed)
Follow up w/ Steve [name] in 3-4 weeks We'll notify you of your lab results and make any changes if needed RESTART the Lisinopril and Januvia daily RESTART the gabapentin for the burning in the foot- start with 1 pill nightly for 3 days and then increase to twice daily for 3 days and then increase to 3x/day Start Claritin or Zyrtec daily for allergies Call with any questions or concerns Hang in there!

## 2015-03-23 NOTE — Progress Notes (Signed)
   Subjective:    Patient ID: Steve [name], male    DOB: [birth year], 54 y.o.   MRN: [id number]  HPI L foot numbness- sxs started ~1 week ago.  No change in footwear.  No known injury.  Numbness has been constant, worse at night.  No pain in foot but does have burning.  DM- chronic problem, currently on Novolog sliding scale insulin.  Taking Novolin twice daily.  Still on Metformin but out of Januvia x6 weeks.  Denies abd pain, N/V, myalgias.  HTN- chronic problem, has been out of Lisinopril x6 weeks.  No CP, SOB, HAs, visual changes, edema.   Review of Systems For ROS see HPI     Objective:   Physical Exam  Constitutional: He is oriented to person, place, and time. He appears well-developed and well-nourished. No distress.  HENT:  Head: Normocephalic and atraumatic.  Eyes: Conjunctivae and EOM are normal. Pupils are equal, round, and reactive to light.  Neck: Normal range of motion. Neck supple. No thyromegaly present.  Cardiovascular: Normal rate, regular rhythm, normal heart sounds and intact distal pulses.   No murmur heard. Pulmonary/Chest: Effort normal and breath sounds normal. No respiratory distress.  Abdominal: Soft. Bowel sounds are normal. He exhibits no distension.  Musculoskeletal: He exhibits no edema or tenderness.  Lymphadenopathy:    He has no cervical adenopathy.  Neurological: He is alert and oriented to person, place, and time. He has normal reflexes. No cranial nerve deficit.  Skin: Skin is warm and dry. No rash noted. No erythema.  Psychiatric: He has a normal mood and affect. His behavior is normal.  Vitals reviewed.         Assessment & Plan:

## 2015-03-23 NOTE — Assessment & Plan Note (Signed)
New.  May be due to uncontrolled diabetes.  Will check labs to r/o other possible metabolic causes and address abnormalities as needed.  Pt expressed understanding and is in agreement w/ plan.

## 2015-03-23 NOTE — Assessment & Plan Note (Signed)
Deteriorated.  Ran out of lisinopril 6 weeks ago.  Refill provided.  Pt to f/u w/ PCP to recheck BMP and assess BP.  Pt expressed understanding and is in agreement w/ plan.

## 2015-03-23 NOTE — Progress Notes (Signed)
Pre visit review using our clinic review tool, if applicable. No additional management support is needed unless otherwise documented below in the visit note. 

## 2015-03-23 NOTE — Assessment & Plan Note (Signed)
Chronic problem.  Pt has not followed up as directed.  Pt ran out of meds ~6 weeks ago.  Refill Januvia.  Still taking insulin and metformin.  Check labs.  Instructed pt to f/u w/ PCP to adjust medication.  Pt expressed understanding and is in agreement w/ plan.

## 2015-03-26 ENCOUNTER — Encounter: Payer: Self-pay | Admitting: General Practice

## 2015-04-20 ENCOUNTER — Ambulatory Visit: Payer: 59 | Admitting: Physician Assistant

## 2015-04-23 ENCOUNTER — Telehealth: Payer: Self-pay | Admitting: Physician Assistant

## 2015-04-23 ENCOUNTER — Encounter: Payer: Self-pay | Admitting: Physician Assistant

## 2015-04-23 NOTE — Telephone Encounter (Signed)
Pt was no show 04/20/15 9:15am, follow up 15 appt, pt has not rescheduled, mailing letter, do you want to charge no show fee?

## 2015-04-23 NOTE — Telephone Encounter (Signed)
Charge. 

## 2015-04-25 ENCOUNTER — Emergency Department (HOSPITAL_COMMUNITY): Payer: 59

## 2015-04-25 ENCOUNTER — Emergency Department (HOSPITAL_COMMUNITY)
Admission: EM | Admit: 2015-04-25 | Discharge: 2015-04-25 | Disposition: A | Discharge status: Home / Self Care | Payer: 59 | Attending: Emergency Medicine | Admitting: Emergency Medicine

## 2015-04-25 ENCOUNTER — Encounter (HOSPITAL_COMMUNITY): Payer: Self-pay | Admitting: Family Medicine

## 2015-04-25 DIAGNOSIS — I2510 Atherosclerotic heart disease of native coronary artery without angina pectoris: Secondary | ICD-10-CM | POA: Insufficient documentation

## 2015-04-25 DIAGNOSIS — E785 Hyperlipidemia, unspecified: Secondary | ICD-10-CM | POA: Diagnosis not present

## 2015-04-25 DIAGNOSIS — Z23 Encounter for immunization: Secondary | ICD-10-CM | POA: Insufficient documentation

## 2015-04-25 DIAGNOSIS — Z794 Long term (current) use of insulin: Secondary | ICD-10-CM | POA: Insufficient documentation

## 2015-04-25 DIAGNOSIS — Z7951 Long term (current) use of inhaled steroids: Secondary | ICD-10-CM | POA: Insufficient documentation

## 2015-04-25 DIAGNOSIS — I10 Essential (primary) hypertension: Secondary | ICD-10-CM | POA: Diagnosis not present

## 2015-04-25 DIAGNOSIS — S81811A Laceration without foreign body, right lower leg, initial encounter: Secondary | ICD-10-CM | POA: Insufficient documentation

## 2015-04-25 DIAGNOSIS — Z7982 Long term (current) use of aspirin: Secondary | ICD-10-CM | POA: Diagnosis not present

## 2015-04-25 DIAGNOSIS — Y9289 Other specified places as the place of occurrence of the external cause: Secondary | ICD-10-CM | POA: Diagnosis not present

## 2015-04-25 DIAGNOSIS — Z9889 Other specified postprocedural states: Secondary | ICD-10-CM | POA: Diagnosis not present

## 2015-04-25 DIAGNOSIS — E119 Type 2 diabetes mellitus without complications: Secondary | ICD-10-CM | POA: Insufficient documentation

## 2015-04-25 DIAGNOSIS — Y998 Other external cause status: Secondary | ICD-10-CM | POA: Insufficient documentation

## 2015-04-25 DIAGNOSIS — Z872 Personal history of diseases of the skin and subcutaneous tissue: Secondary | ICD-10-CM | POA: Diagnosis not present

## 2015-04-25 DIAGNOSIS — Z8619 Personal history of other infectious and parasitic diseases: Secondary | ICD-10-CM | POA: Insufficient documentation

## 2015-04-25 DIAGNOSIS — W25XXXA Contact with sharp glass, initial encounter: Secondary | ICD-10-CM | POA: Diagnosis not present

## 2015-04-25 DIAGNOSIS — S61411A Laceration without foreign body of right hand, initial encounter: Secondary | ICD-10-CM | POA: Insufficient documentation

## 2015-04-25 DIAGNOSIS — Z79899 Other long term (current) drug therapy: Secondary | ICD-10-CM | POA: Insufficient documentation

## 2015-04-25 DIAGNOSIS — Y9389 Activity, other specified: Secondary | ICD-10-CM | POA: Diagnosis not present

## 2015-04-25 DIAGNOSIS — Z8659 Personal history of other mental and behavioral disorders: Secondary | ICD-10-CM | POA: Insufficient documentation

## 2015-04-25 MED ORDER — CEPHALEXIN 500 MG PO CAPS: 500.0000 mg | ORAL_CAPSULE | Freq: Four times a day (QID) | ORAL | Status: DC

## 2015-04-25 MED ORDER — LIDOCAINE-EPINEPHRINE-TETRACAINE (LET) SOLUTION
3.0000 mL | Freq: Once | NASAL | Status: AC
  Administered 2015-04-25: 3 mL via TOPICAL
  Filled 2015-04-25: qty 3

## 2015-04-25 MED ORDER — LIDOCAINE-EPINEPHRINE (PF) 2 %-1:200000 IJ SOLN
20.0000 mL | Freq: Once | INTRAMUSCULAR | Status: AC
  Administered 2015-04-25: 20 mL
  Filled 2015-04-25: qty 20

## 2015-04-25 MED ORDER — OXYCODONE-ACETAMINOPHEN 5-325 MG PO TABS: 1.0000 | ORAL_TABLET | ORAL | Status: DC | PRN

## 2015-04-25 MED ORDER — ACETAMINOPHEN 500 MG PO TABS
1000.0000 mg | ORAL_TABLET | Freq: Once | ORAL | Status: AC
  Administered 2015-04-25: 1000 mg via ORAL
  Filled 2015-04-25: qty 2

## 2015-04-25 MED ORDER — TETANUS-DIPHTH-ACELL PERTUSSIS 5-2.5-18.5 LF-MCG/0.5 IM SUSP
0.5000 mL | Freq: Once | INTRAMUSCULAR | Status: AC
  Administered 2015-04-25: 0.5 mL via INTRAMUSCULAR
  Filled 2015-04-25: qty 0.5

## 2015-04-25 NOTE — ED Provider Notes (Signed)
MSE was initiated and I personally evaluated the patient and placed orders (if any) at  2:41 PM on April 25, 2015.  The patient appears stable so that the remainder of the MSE may be completed by another provider.  Blood pressure 129/76, pulse 86, temperature 98 F (36.7 C), resp. rate 18, SpO2 99 %.  Steve [name] is a 54 y.o. male complaining of aspiration to right lower shin and right hand occurring just prior to arrival when a window the patient was having on a house fell and shattered. Patient reports his pain is minimal, 1 out of 10, the hand is more painful than the foot. His last tetanus shot was in 2002. He denies weakness, numbness, reduced range of motion but endorses a numbness proximal and distal to the leg laceration.   Patient is a small half centimeter laceration to the dorsum of the right hand, full range of motion to fingers and grip strength is 5 out of 5, he is distally neurovascular intact and able to differentiate between pinprick and light touch  She has a 8 cm flap-like laceration to the shin of the distal right leg. Bleeding is well controlled, no gross contamination. DP  pulses are 2+ with excellent range of motion to the toes and intact distal sensation.  Monico Blitz, PA-C 04/25/15 [place], MD 04/27/15 [phone]

## 2015-04-25 NOTE — ED Notes (Signed)
Patient transported to X-ray 

## 2015-04-25 NOTE — ED Provider Notes (Signed)
CSN: [id number]     Arrival date & time 04/25/15  1204 History   First MD Initiated Contact with Patient 04/25/15 1502     Chief Complaint  Patient presents with  . Laceration     (Consider location/radiation/quality/duration/timing/severity/associated sxs/prior Treatment) Patient is a 54 y.o. male presenting with skin laceration. The history is provided by the patient and medical records.  Laceration  This is a 54 y.o. M with hx of HTN, DM, HLP, CAD, headaches, presenting to the ED for laceration.  Patient reports he was changing a window and glass broke cutting his right leg and right hand.  Patient has 8cm semi-circular laceration to right distal shin and 1cm superficial laceration to dorsal right hand.  Patient remains ambulatory without difficulty.  He initially stated a "pins and needles" sensation which resolved prior to my evaluation.  No weakness.  Date of last tetanus 2002.  Patient is diabetic.  No hx of MRSA, HIV.  Patient did clean wound PTA.  He declined pain medication on arrival to ED. VSS.  Past Medical History  Diagnosis Date  . Diabetes mellitus without complication   . Hypertension   . Hyperlipidemia   . Complication of anesthesia     has night terrors after anesthesia but can be violent when waking up  . 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 Pt stated "I forget alot of things"  . Frequent urination at night     when blood sugar runs high  . Psoriasis of scalp     nose and face; occurs mainly in winter   . Seasonal allergies   . CAD (coronary artery disease)   . Headache(784.0)   . Sinus infection 12/16/13  . ADHD (attention deficit hyperactivity disorder)   . Allergy    Past Surgical History  Procedure Laterality Date  . Hernia repair  2010    abdominal hernia  . Knee arthroscopy  2012    left knee  . Cardiac catheterization      no PCI approx 10 years ago - [facility]      right ring finger  . Knee surgery    .  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 Diabetes Mother   . Cancer Mother     history breast cancer  . Hypertension Mother   . Cancer Father     prostate  . Alzheimer's disease Father   . Diabetes Father   . Hypertension Father   . Cancer Maternal Uncle     colon  . Colon cancer Maternal Uncle 89  . Cancer Maternal Grandmother     breast  . Cancer Maternal Grandfather     prostate  . Cancer Maternal Uncle     prostate  . Colon cancer Maternal Uncle 79  . Cancer Maternal Uncle     prostate  . Cancer Cousin 47    metastatic colon cancer?  . Colon polyps Paternal Uncle   . Esophageal cancer Neg Hx   . Stomach cancer Neg Hx   . Rectal cancer Neg Hx    History  Substance Use Topics  . Smoking status: Never Smoker   . Smokeless tobacco: Never Used  . Alcohol Use: 0.6 - 1.2 oz/week    1-2 Cans  of beer per week     Comment: rare beer    Review of Systems  Skin: Positive for wound.  All other systems reviewed and are negative.     Allergies  Sulfa antibiotics  Home Medications   Prior to Admission medications   Medication Sig Start Date End Date Taking? Authorizing Provider  atorvastatin (LIPITOR) 10 MG tablet TAKE ONE TABLET BY MOUTH ONCE DAILY 02/05/15  Yes Brunetta Jeans, PA-C  fluticasone [facility]) 50 MCG/ACT nasal spray Place 2 sprays into both nostrils daily. Patient taking differently: Place 2 sprays into both nostrils as needed for allergies.  10/20/13  Yes Brunetta Jeans, PA-C  gabapentin (NEURONTIN) 300 MG capsule Take 1 capsule (300 mg total) by mouth 3 (three) times daily. 03/23/15  Yes Midge Minium, MD  insulin aspart (NOVOLOG FLEXPEN) 100 UNIT/ML FlexPen Inject 0-12 Units into the skin 3 (three) times daily with meals. sliding scale- check sugar and inject 3 times daily  before meals as below:  <150-   Zero units 150-200 2 units 201-250 4 units 251-300 6 units 301-350 8 units 351-400 10 units >400             12 units and contact us. 11/01/14  Yes Brunetta Jeans, PA-C  insulin NPH Human (HUMULIN N,NOVOLIN N) 100 UNIT/ML injection 30 units in the morning and 30-40 units at bedtime. 11/01/14  Yes Brunetta Jeans, PA-C  lisinopril (PRINIVIL,ZESTRIL) 5 MG tablet Take 1 tablet (5 mg total) by mouth daily. 03/23/15  Yes Midge Minium, MD  metFORMIN (GLUCOPHAGE) 1000 MG tablet TAKE ONE TABLET BY MOUTH TWICE DAILY WITH MEALS 03/13/15  Yes Brunetta Jeans, PA-C  Multiple Vitamins-Minerals (ZINC PO) Take 1 tablet by mouth daily.    Yes Historical Provider, MD  sitaGLIPtin (JANUVIA) 100 MG tablet TAKE 1 TABLET BY MOUTH DAILY. 03/23/15  Yes Midge Minium, MD  aspirin EC 325 MG tablet Take 1 tablet (325 mg total) by mouth daily. 12/29/13   Mary L Stanbery, PA-C   BP 144/93 mmHg  Pulse 88  Temp(Src) 98 F (36.7 C)  Resp 18  SpO2 99%   Physical Exam  Constitutional: He is oriented to person, place, and time. He appears well-developed and well-nourished. No distress.  HENT:  Head: Normocephalic and atraumatic.  Mouth/Throat: Oropharynx is clear and moist.  Eyes: Conjunctivae and EOM are normal. Pupils are equal, round, and reactive to light.  Neck: Normal range of motion. Neck supple.  Cardiovascular: Normal rate, regular rhythm and normal heart sounds.   Pulmonary/Chest: Effort normal and breath sounds normal. No respiratory distress. He has no wheezes.  Abdominal: Soft. Bowel sounds are normal. There is no tenderness. There is no guarding.  Musculoskeletal: Normal range of motion.  8cm deep, semi-circular laceration of right shin with flap; bleeding well controlled at this time with clot present; full ROM of right ankle, foot, and all toes; normal strength; DP pulse and cap refill intact; normal sensation to dull and sharp touch  Small 0.5 superficial  laceration of right dorsal hand in webbed space between thumb and index finger; no bleeding; full ROM of all fingers and wrist; hand is NVI  Neurological: He is alert and oriented to person, place, and time.  Skin: Skin is warm and dry. He is not diaphoretic.  Psychiatric: He has a normal mood and affect.  Nursing note and vitals reviewed.   ED Course  Procedures (including critical care time)  LACERATION REPAIR Performed by: Baird Cancer,  Alasdair Kleve M Authorized by: Larene Pickett Consent: Verbal consent obtained. Risks and benefits: risks, benefits and alternatives were discussed Consent given by: patient Patient identity confirmed: provided demographic data Prepped and Draped in normal sterile fashion Wound explored  Laceration Location: right anterior distal shin  Laceration Length: 8 cm, semi-circular  No Foreign Bodies seen or palpated  Anesthesia: local infiltration  Local anesthetic: lidocaine 2% with epinephrine  Anesthetic total: 8 ml  Irrigation method: syringe Amount of cleaning: standard  Skin closure: 3-0 prolene  Number of sutures: 11  Technique: simple interrupted  Patient tolerance: Patient tolerated the procedure well with no immediate complications.  Labs Review Labs Reviewed - No data to display  Imaging Review Dg Tibia/fibula Right  04/25/2015   CLINICAL DATA:  Glass fell on patient.  Wound on the anterior tibia.  EXAM: RIGHT TIBIA AND FIBULA - 2 VIEW  COMPARISON:  None.  FINDINGS: Deformity in the distal fibula suggests an old fracture. Negative for an acute fracture or dislocation. No evidence for a radiopaque foreign body. There is a bandage along the anterior lower shin with underlying soft tissue irregularity. Mild spurring at the calcaneus.  IMPRESSION: No acute bone abnormality to the right tibia or fibula. No evidence for a radiopaque foreign body.   Electronically Signed   By: Markus Daft M.D.   On: 04/25/2015 15:29   Dg Hand Complete  Right  04/25/2015   CLINICAL DATA:  Cut hand with glass. Injury between the thumb and index finger.  EXAM: RIGHT HAND - COMPLETE 3+ VIEW  COMPARISON:  None.  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 No evidence for radiopaque foreign bodies in the soft tissues. Negative for an acute fracture or dislocation.  IMPRESSION: No acute bone abnormality in the right hand.  No evidence for a radiopaque foreign body.   Electronically Signed   By: Markus Daft M.D.   On: 04/25/2015 15:22     EKG Interpretation None      MDM   Final diagnoses:  Leg laceration, right, initial encounter  Hand laceration, right, initial encounter   54 y.o. F here with right leg and hand laceration while replacing a window.  He sustained a deep laceration to right distal shin with flap noted. Superficial laceration of right dorsal hand as well. Last tetanus was 2002. Patient initially endorsed a pins and needles type sensation in his right leg and hand, however this resolved prior to my evaluation. Patient's extremities are neurovascularly intact and x-rays were obtained, no acute findings or retained foreign body.  Tetanus updated.  Leg wound repaired as above, patient tolerated well.  Hand laceration superficial and not requiring sutures.  Given deep wound of leg and patient's history of diabetes, will start on prophylactic antibiotics. Percocet also given for pain. Patient will follow with his PCP in one week for suture removal.  Discussed plan with patient, he/she acknowledged understanding and agreed with plan of care.  Return precautions given for new or worsening symptoms.  Larene Pickett, PA-C 04/25/15 1656  Milton Ferguson, MD 04/25/15 2220

## 2015-04-25 NOTE — ED Notes (Signed)
Pt here for laceration to RLE. St she was changing a window out and the glass broke cutting leg and right hand. sts his whole right leg is numb and right hand. Pt diabetic.

## 2015-04-25 NOTE — Discharge Instructions (Signed)
Take the prescribed medication as directed.  Use caution when moving around not to pop stitches loose.  Try to stay off right leg for the next 24-48 hours. Follow-up with your primary care physician for suture removal in 1 week. Return to the ED for new or worsening symptoms.

## 2015-04-26 ENCOUNTER — Other Ambulatory Visit: Payer: Self-pay | Admitting: Physician Assistant

## 2015-04-26 DIAGNOSIS — I10 Essential (primary) hypertension: Secondary | ICD-10-CM

## 2015-04-26 MED ORDER — LISINOPRIL 5 MG PO TABS: 5.0000 mg | ORAL_TABLET | Freq: Every day | ORAL | Status: DC

## 2015-04-26 MED ORDER — INSULIN ASPART 100 UNIT/ML FLEXPEN: 0.0000 [IU] | PEN_INJECTOR | Freq: Three times a day (TID) | SUBCUTANEOUS | Status: DC

## 2015-04-26 MED ORDER — SITAGLIPTIN PHOSPHATE 100 MG PO TABS: ORAL_TABLET | ORAL | Status: DC

## 2015-04-26 NOTE — Telephone Encounter (Signed)
Caller name: Naquan Garman Relationship to patient: self Can be reached:  Pharmacy: [facility] ([place] [place] [address]  Reason for call: Pt needing refills on januvia, metformin, lisinopril, and novolog. Pt states out of all meds. Missed appt 04/20/15 with Einar Pheasant. Pt rescheduled for 05/02/15. Please fill today.

## 2015-04-26 NOTE — Telephone Encounter (Signed)
Januvia Last filled:  03/23/15 Amt: 30, 0 refills  Metformin Last filled: 03/13/15 Amt: 60, 0   Lisinopril Last filled: 03/23/15 Amt: 30, 0  Novolog Last filled:  11/01/14 Amt: 15 ml, 3 refills  Last OV: 03/23/15 [place]  Meds filled. Novolog Rx printed for signature.

## 2015-05-02 ENCOUNTER — Ambulatory Visit (INDEPENDENT_AMBULATORY_CARE_PROVIDER_SITE_OTHER): Payer: 59 | Admitting: Physician Assistant

## 2015-05-02 ENCOUNTER — Encounter: Payer: Self-pay | Admitting: Physician Assistant

## 2015-05-02 VITALS — BP 122/82 | HR 91 | Temp 98.3°F | Ht 76.0 in | Wt 272.2 lb

## 2015-05-02 DIAGNOSIS — T798XXD Other early complications of trauma, subsequent encounter: Secondary | ICD-10-CM | POA: Diagnosis not present

## 2015-05-02 DIAGNOSIS — I10 Essential (primary) hypertension: Secondary | ICD-10-CM | POA: Diagnosis not present

## 2015-05-02 MED ORDER — INSULIN ASPART 100 UNIT/ML FLEXPEN: 0.0000 [IU] | PEN_INJECTOR | Freq: Three times a day (TID) | SUBCUTANEOUS | Status: DC

## 2015-05-02 MED ORDER — DOXYCYCLINE HYCLATE 100 MG PO CAPS: 100.0000 mg | ORAL_CAPSULE | Freq: Two times a day (BID) | ORAL | Status: DC

## 2015-05-02 NOTE — Progress Notes (Signed)
Pre visit review using our clinic review tool, if applicable. No additional management support is needed unless otherwise documented below in the visit note. 

## 2015-05-02 NOTE — Patient Instructions (Signed)
Please continue blood pressure medications and diabetic medications. Make sure to resume the mealtime insulin following sliding scale instructions given.  Stop the keflex and begin the Doxycycline as directed. Keep area clean and dry.  Follow-up Friday for stitch removal -- too early to take stitches out due to delayed healing from diabetes.  If anything worsens or you develop fever, go directly to the ER.

## 2015-05-04 ENCOUNTER — Encounter: Payer: Self-pay | Admitting: Physician Assistant

## 2015-05-04 ENCOUNTER — Ambulatory Visit (INDEPENDENT_AMBULATORY_CARE_PROVIDER_SITE_OTHER): Payer: 59 | Admitting: Physician Assistant

## 2015-05-04 VITALS — BP 132/80 | HR 92 | Temp 97.7°F | Ht 76.0 in | Wt 269.0 lb

## 2015-05-04 DIAGNOSIS — T798XXD Other early complications of trauma, subsequent encounter: Secondary | ICD-10-CM

## 2015-05-04 NOTE — Progress Notes (Signed)
Pre visit review using our clinic review tool, if applicable. No additional management support is needed unless otherwise documented below in the visit note. 

## 2015-05-06 DIAGNOSIS — L089 Local infection of the skin and subcutaneous tissue, unspecified: Secondary | ICD-10-CM | POA: Insufficient documentation

## 2015-05-06 DIAGNOSIS — T148XXA Other injury of unspecified body region, initial encounter: Secondary | ICD-10-CM

## 2015-05-06 NOTE — Assessment & Plan Note (Signed)
BP stable. Asymptomatic. Will continue current regimen.

## 2015-05-06 NOTE — Progress Notes (Signed)
Patient presents to clinic today c/o for stitch removal and follow-up of hypertension.  Patient endorses taking BP medications as directed with good BP measurements at home. Patient denies chest pain, palpitations, lightheadedness, dizziness, vision changes or frequent headaches.  BP Readings from Last 3 Encounters:  05/04/15 132/80  05/02/15 122/82  04/25/15 129/73   Patient also needing wound assessment for stitch removal. Stitches placed 7 days ago by ER MD after patient presented with laceration to R lower extremity. Patient started on Keflex giving his diabetes and given strict follow-up. Patient notes redness, tenderness and drainage around wound. Denies dehiscence of wound. Denies fever, chills, malaise.  Past Medical History  Diagnosis Date  . Diabetes mellitus without complication   . Hypertension   . Hyperlipidemia   . Complication of anesthesia     has night terrors after anesthesia but can be violent when waking up  . 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 Pt stated "I forget alot of things"  . Frequent urination at night     when blood sugar runs high  . Psoriasis of scalp     nose and face; occurs mainly in winter   . Seasonal allergies   . CAD (coronary artery disease)   . Headache(784.0)   . Sinus infection 12/16/13  . ADHD (attention deficit hyperactivity disorder)   . Allergy     Current Outpatient Prescriptions on File Prior to Visit  Medication Sig Dispense Refill  . aspirin EC 325 MG tablet Take 1 tablet (325 mg total) by mouth daily. 30 tablet 0  . atorvastatin (LIPITOR) 10 MG tablet TAKE ONE TABLET BY MOUTH ONCE DAILY 30 tablet 2  . fluticasone (FLONASE) 50 MCG/ACT nasal spray Place 2 sprays into both nostrils daily. (Patient taking differently: Place 2 sprays into both nostrils as needed for allergies. ) 16 g 6  . gabapentin (NEURONTIN) 300 MG capsule Take 1 capsule (300 mg total) by mouth 3 (three) times daily. 90 capsule 0  . insulin  NPH Human (HUMULIN N,NOVOLIN N) 100 UNIT/ML injection 30 units in the morning and 30-40 units at bedtime. 10 mL 2  . lisinopril (PRINIVIL,ZESTRIL) 5 MG tablet Take 1 tablet (5 mg total) by mouth daily. 30 tablet 0  . metFORMIN (GLUCOPHAGE) 1000 MG tablet TAKE ONE TABLET BY MOUTH TWICE DAILY WITH MEALS 60 tablet 0  . Multiple Vitamins-Minerals (ZINC PO) Take 1 tablet by mouth daily.     . sitaGLIPtin (JANUVIA) 100 MG tablet TAKE 1 TABLET BY MOUTH DAILY. 30 tablet 0   No current facility-administered medications on file prior to visit.    Allergies  Allergen Reactions  . Sulfa Antibiotics Itching and Other (See Comments)    Burning also    Family History  Problem Relation Age of Onset  . Diabetes Mother   . Cancer Mother     history breast cancer  . Hypertension Mother   . Cancer Father     prostate  . Alzheimer's disease Father   . Diabetes Father   . Hypertension Father   . Cancer Maternal Uncle     colon  . Colon cancer Maternal Uncle 88  . Cancer Maternal Grandmother     breast  . Cancer Maternal Grandfather     prostate  . Cancer Maternal Uncle     prostate  . Colon cancer Maternal Uncle 7  . Cancer Maternal Uncle     prostate  . Cancer Cousin 64  metastatic colon cancer?  . Colon polyps Paternal Uncle   . Esophageal cancer Neg Hx   . Stomach cancer Neg Hx   . Rectal cancer Neg Hx     History   Social History  . Marital Status: Married    Spouse Name: N/A  . Number of Children: 6  . Years of Education: N/A   Social History Main Topics  . Smoking status: Never Smoker   . Smokeless tobacco: Never Used  . Alcohol Use: 0.6 - 1.2 oz/week    1-2 Cans of beer per week     Comment: rare beer  . Drug Use: No  . Sexual Activity:    Partners: Female   Other Topics Concern  . None   Social History Narrative   Regular exercise: very little   Caffeine use: sweet tea daily   6 children   Works in [employer], owns concession.   Married   Enjoys  televition.      Review of Systems - See HPI.  All other ROS are negative.  BP 122/82 mmHg  Pulse 91  Temp(Src) 98.3 F (36.8 C) (Oral)  Ht 6\' 4"  (1.93 m)  Wt 272 lb 4 oz (123.492 kg)  BMI 33.15 kg/m2  SpO2 97%  Physical Exam  Constitutional: He is oriented to person, place, and time and well-developed, well-nourished, and in no distress.  HENT:  Head: Normocephalic and atraumatic.  Eyes: Conjunctivae are normal.  Neck: Neck supple.  Cardiovascular: Normal rate, regular rhythm, normal heart sounds and intact distal pulses.   Pulmonary/Chest: Effort normal and breath sounds normal. No respiratory distress. He has no wheezes. He has no rales. He exhibits no tenderness.  Neurological: He is alert and oriented to person, place, and time.  Skin: Skin is warm.     Vitals reviewed.   Recent Results (from the past 2160 hour(s))  Hemoglobin A1c     Status: Abnormal   Collection Time: 03/23/15 11:45 AM  Result Value Ref Range   Hgb A1c MFr Bld 9.4 (H) 4.6 - 6.5 %    Comment: Glycemic Control Guidelines for People with Diabetes:Non Diabetic:  <6%Goal of Therapy: <7%Additional Action Suggested:  >8%   Lipid panel     Status: Abnormal   Collection Time: 03/23/15 11:45 AM  Result Value Ref Range   Cholesterol 139 0 - 200 mg/dL    Comment: ATP III Classification       Desirable:  < 200 mg/dL               Borderline High:  200 - 239 mg/dL          High:  > = 240 mg/dL   Triglycerides 504.0 (H) 0.0 - 149.0 mg/dL    Comment: Normal:  <150 mg/dLBorderline High:  150 - 199 mg/dLTriglyceride is over 400; calculations on Lipids are invalid.   HDL 32.60 (L) >39.00 mg/dL   Total CHOL/HDL Ratio 4     Comment:                Men          Women1/2 Average Risk     3.4          3.3Average Risk          5.0          4.42X Average Risk          9.6          7.13X Average Risk  15.0          11.0                      Basic metabolic panel     Status: Abnormal   Collection Time: 03/23/15 11:45  AM  Result Value Ref Range   Sodium 139 135 - 145 mEq/L   Potassium 4.2 3.5 - 5.1 mEq/L   Chloride 104 96 - 112 mEq/L   CO2 21 19 - 32 mEq/L   Glucose, Bld 205 (H) 70 - 99 mg/dL   BUN 11 6 - 23 mg/dL   Creatinine, Ser 0.86 0.40 - 1.50 mg/dL   Calcium 9.1 8.4 - 10.5 mg/dL   GFR 99.27 >60.00 mL/min  Hepatic function panel     Status: None   Collection Time: 03/23/15 11:45 AM  Result Value Ref Range   Total Bilirubin 0.4 0.2 - 1.2 mg/dL   Bilirubin, Direct 0.0 0.0 - 0.3 mg/dL   Alkaline Phosphatase 82 39 - 117 U/L   AST 29 0 - 37 U/L   ALT 36 0 - 53 U/L   Total Protein 6.7 6.0 - 8.3 g/dL   Albumin 4.2 3.5 - 5.2 g/dL  TSH     Status: None   Collection Time: 03/23/15 11:45 AM  Result Value Ref Range   TSH 1.20 0.35 - 4.50 uIU/mL  CBC with Differential/Platelet     Status: None   Collection Time: 03/23/15 11:45 AM  Result Value Ref Range   WBC 5.6 4.0 - 10.5 K/uL   RBC 5.09 4.22 - 5.81 Mil/uL   Hemoglobin 13.2 13.0 - 17.0 g/dL   HCT 40.4 39.0 - 52.0 %   MCV 79.3 78.0 - 100.0 fl   MCHC 32.8 30.0 - 36.0 g/dL   RDW 14.1 11.5 - 15.5 %   Platelets 263.0 150.0 - 400.0 K/uL   Neutrophils Relative % 63.5 43.0 - 77.0 %   Lymphocytes Relative 28.5 12.0 - 46.0 %   Monocytes Relative 6.0 3.0 - 12.0 %   Eosinophils Relative 1.7 0.0 - 5.0 %   Basophils Relative 0.3 0.0 - 3.0 %   Neutro Abs 3.6 1.4 - 7.7 K/uL   Lymphs Abs 1.6 0.7 - 4.0 K/uL   Monocytes Absolute 0.3 0.1 - 1.0 K/uL   Eosinophils Absolute 0.1 0.0 - 0.7 K/uL   Basophils Absolute 0.0 0.0 - 0.1 K/uL  B12 and Folate Panel     Status: Abnormal   Collection Time: 03/23/15 11:45 AM  Result Value Ref Range   Vitamin B-12 206 (L) 211 - 911 pg/mL   Folate 24.4 >5.9 ng/mL  LDL cholesterol, direct     Status: None   Collection Time: 03/23/15 11:45 AM  Result Value Ref Range   Direct LDL 52.0 mg/dL    Comment: Optimal:  <100 mg/dLNear or Above Optimal:  100-129 mg/dLBorderline High:  130-159 mg/dLHigh:  160-189 mg/dLVery High:   >190 mg/dL    Assessment/Plan: Infected wound Wound with delayed healing secondary to diabetes mellitus and infection. Wound is most definitely not ready for suture removal today. Will stop Keflex and begin Doxycycline BID. Wound care instructions given to patient. He is to follow-up in 2 days for reassessment. Alarm signs/symptoms reviewed. He is to go directly to ER if any of these were to occur.  Essential hypertension, benign BP stable. Asymptomatic. Will continue current regimen.

## 2015-05-06 NOTE — Assessment & Plan Note (Signed)
Infection resolved. Finish course of doxycycline. Sutures removed without complication. Steri-Strips applied to wound. Wound care discussed with patient. Follow-up on Monday for final assessment. We are being very cautious giving his uncontrolled diabetes and recent infection.

## 2015-05-06 NOTE — Assessment & Plan Note (Signed)
Wound with delayed healing secondary to diabetes mellitus and infection. Wound is most definitely not ready for suture removal today. Will stop Keflex and begin Doxycycline BID. Wound care instructions given to patient. He is to follow-up in 2 days for reassessment. Alarm signs/symptoms reviewed. He is to go directly to ER if any of these were to occur.

## 2015-05-06 NOTE — Progress Notes (Signed)
Patient presents to clinic today for follow-up of infected laceration and for reassessment for suture removal. Patient endorses taking the doxycycline as directed. Denies side effect of medication. Endorses redness around wound and drainage have resolved. Swelling has reduced. Patient denies dehiscence of wound. Denies fever or chills.  Past Medical History  Diagnosis Date  . Diabetes mellitus without complication   . Hypertension   . Hyperlipidemia   . Complication of anesthesia     has night terrors after anesthesia but can be violent when waking up  . 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 Pt stated "I forget alot of things"  . Frequent urination at night     when blood sugar runs high  . Psoriasis of scalp     nose and face; occurs mainly in winter   . Seasonal allergies   . CAD (coronary artery disease)   . Headache(784.0)   . Sinus infection 12/16/13  . ADHD (attention deficit hyperactivity disorder)   . Allergy     Current Outpatient Prescriptions on File Prior to Visit  Medication Sig Dispense Refill  . aspirin EC 325 MG tablet Take 1 tablet (325 mg total) by mouth daily. 30 tablet 0  . atorvastatin (LIPITOR) 10 MG tablet TAKE ONE TABLET BY MOUTH ONCE DAILY 30 tablet 2  . doxycycline (VIBRAMYCIN) 100 MG capsule Take 1 capsule (100 mg total) by mouth 2 (two) times daily. 14 capsule 0  . fluticasone (FLONASE) 50 MCG/ACT nasal spray Place 2 sprays into both nostrils daily. (Patient taking differently: Place 2 sprays into both nostrils as needed for allergies. ) 16 g 6  . gabapentin (NEURONTIN) 300 MG capsule Take 1 capsule (300 mg total) by mouth 3 (three) times daily. 90 capsule 0  . insulin aspart (NOVOLOG FLEXPEN) 100 UNIT/ML FlexPen Inject 0-12 Units into the skin 3 (three) times daily with meals. sliding scale- check sugar and inject 3 times daily before meals as below:  <150-   Zero units 150-200 2 units 201-250 4 units 251-300 6 units 301-350 8  units 351-400 10 units >400             12 units and contact us. 15 mL 0  . insulin NPH Human (HUMULIN N,NOVOLIN N) 100 UNIT/ML injection 30 units in the morning and 30-40 units at bedtime. 10 mL 2  . lisinopril (PRINIVIL,ZESTRIL) 5 MG tablet Take 1 tablet (5 mg total) by mouth daily. 30 tablet 0  . metFORMIN (GLUCOPHAGE) 1000 MG tablet TAKE ONE TABLET BY MOUTH TWICE DAILY WITH MEALS 60 tablet 0  . Multiple Vitamins-Minerals (ZINC PO) Take 1 tablet by mouth daily.     . sitaGLIPtin (JANUVIA) 100 MG tablet TAKE 1 TABLET BY MOUTH DAILY. 30 tablet 0   No current facility-administered medications on file prior to visit.    Allergies  Allergen Reactions  . Sulfa Antibiotics Itching and Other (See Comments)    Burning also    Family History  Problem Relation Age of Onset  . Diabetes Mother   . Cancer Mother     history breast cancer  . Hypertension Mother   . Cancer Father     prostate  . Alzheimer's disease Father   . Diabetes Father   . Hypertension Father   . Cancer Maternal Uncle     colon  . Colon cancer Maternal Uncle 78  . Cancer Maternal Grandmother     breast  . Cancer Maternal Grandfather  prostate  . Cancer Maternal Uncle     prostate  . Colon cancer Maternal Uncle 24  . Cancer Maternal Uncle     prostate  . Cancer Cousin 47    metastatic colon cancer?  . Colon polyps Paternal Uncle   . Esophageal cancer Neg Hx   . Stomach cancer Neg Hx   . Rectal cancer Neg Hx     History   Social History  . Marital Status: Married    Spouse Name: N/A  . Number of Children: 6  . Years of Education: N/A   Social History Main Topics  . Smoking status: Never Smoker   . Smokeless tobacco: Never Used  . Alcohol Use: 0.6 - 1.2 oz/week    1-2 Cans of beer per week     Comment: rare beer  . Drug Use: No  . Sexual Activity:    Partners: Female   Other Topics Concern  . None   Social History Narrative   Regular exercise: very little   Caffeine use: sweet tea  daily   6 children   Works in Architect, owns concession.   Married   Enjoys televition.      Review of Systems - See HPI.  All other ROS are negative.  BP 132/80 mmHg  Pulse 92  Temp(Src) 97.7 F (36.5 C) (Oral)  Ht 6\' 4"  (1.93 m)  Wt 269 lb (122.018 kg)  BMI 32.76 kg/m2  SpO2 97%  Physical Exam  Constitutional: He is oriented to person, place, and time and well-developed, well-nourished, and in no distress.  HENT:  Head: Normocephalic and atraumatic.  Eyes: Conjunctivae are normal.  Neck: Neck supple.  Cardiovascular: Normal rate, regular rhythm, normal heart sounds and intact distal pulses.   Pulmonary/Chest: Effort normal and breath sounds normal. No respiratory distress. He has no wheezes. He has no rales. He exhibits no tenderness.  Neurological: He is alert and oriented to person, place, and time.  Skin: Skin is warm and dry.  Prior cellulitis has resolved. Swelling minimal. No drainage noted. Wound is healing nicely.  Vitals reviewed.   Recent Results (from the past 2160 hour(s))  Hemoglobin A1c     Status: Abnormal   Collection Time: 03/23/15 11:45 AM  Result Value Ref Range   Hgb A1c MFr Bld 9.4 (H) 4.6 - 6.5 %    Comment: Glycemic Control Guidelines for People with Diabetes:Non Diabetic:  <6%Goal of Therapy: <7%Additional Action Suggested:  >8%   Lipid panel     Status: Abnormal   Collection Time: 03/23/15 11:45 AM  Result Value Ref Range   Cholesterol 139 0 - 200 mg/dL    Comment: ATP III Classification       Desirable:  < 200 mg/dL               Borderline High:  200 - 239 mg/dL          High:  > = 240 mg/dL   Triglycerides 504.0 (H) 0.0 - 149.0 mg/dL    Comment: Normal:  <150 mg/dLBorderline High:  150 - 199 mg/dLTriglyceride is over 400; calculations on Lipids are invalid.   HDL 32.60 (L) >39.00 mg/dL   Total CHOL/HDL Ratio 4     Comment:                Men          Women1/2 Average Risk     3.4          3.3Average Risk  5.0          4.42X  Average Risk          9.6          7.13X Average Risk          15.0          11.0                      Basic metabolic panel     Status: Abnormal   Collection Time: 03/23/15 11:45 AM  Result Value Ref Range   Sodium 139 135 - 145 mEq/L   Potassium 4.2 3.5 - 5.1 mEq/L   Chloride 104 96 - 112 mEq/L   CO2 21 19 - 32 mEq/L   Glucose, Bld 205 (H) 70 - 99 mg/dL   BUN 11 6 - 23 mg/dL   Creatinine, Ser 0.86 0.40 - 1.50 mg/dL   Calcium 9.1 8.4 - 10.5 mg/dL   GFR 99.27 >60.00 mL/min  Hepatic function panel     Status: None   Collection Time: 03/23/15 11:45 AM  Result Value Ref Range   Total Bilirubin 0.4 0.2 - 1.2 mg/dL   Bilirubin, Direct 0.0 0.0 - 0.3 mg/dL   Alkaline Phosphatase 82 39 - 117 U/L   AST 29 0 - 37 U/L   ALT 36 0 - 53 U/L   Total Protein 6.7 6.0 - 8.3 g/dL   Albumin 4.2 3.5 - 5.2 g/dL  TSH     Status: None   Collection Time: 03/23/15 11:45 AM  Result Value Ref Range   TSH 1.20 0.35 - 4.50 uIU/mL  CBC with Differential/Platelet     Status: None   Collection Time: 03/23/15 11:45 AM  Result Value Ref Range   WBC 5.6 4.0 - 10.5 K/uL   RBC 5.09 4.22 - 5.81 Mil/uL   Hemoglobin 13.2 13.0 - 17.0 g/dL   HCT 40.4 39.0 - 52.0 %   MCV 79.3 78.0 - 100.0 fl   MCHC 32.8 30.0 - 36.0 g/dL   RDW 14.1 11.5 - 15.5 %   Platelets 263.0 150.0 - 400.0 K/uL   Neutrophils Relative % 63.5 43.0 - 77.0 %   Lymphocytes Relative 28.5 12.0 - 46.0 %   Monocytes Relative 6.0 3.0 - 12.0 %   Eosinophils Relative 1.7 0.0 - 5.0 %   Basophils Relative 0.3 0.0 - 3.0 %   Neutro Abs 3.6 1.4 - 7.7 K/uL   Lymphs Abs 1.6 0.7 - 4.0 K/uL   Monocytes Absolute 0.3 0.1 - 1.0 K/uL   Eosinophils Absolute 0.1 0.0 - 0.7 K/uL   Basophils Absolute 0.0 0.0 - 0.1 K/uL  B12 and Folate Panel     Status: Abnormal   Collection Time: 03/23/15 11:45 AM  Result Value Ref Range   Vitamin B-12 206 (L) 211 - 911 pg/mL   Folate 24.4 >5.9 ng/mL  LDL cholesterol, direct     Status: None   Collection Time: 03/23/15 11:45 AM   Result Value Ref Range   Direct LDL 52.0 mg/dL    Comment: Optimal:  <100 mg/dLNear or Above Optimal:  100-129 mg/dLBorderline High:  130-159 mg/dLHigh:  160-189 mg/dLVery High:  >190 mg/dL    Assessment/Plan: Infected wound Infection resolved. Finish course of doxycycline. Sutures removed without complication. Steri-Strips applied to wound. Wound care discussed with patient. Follow-up on Monday for final assessment. We are being very cautious giving his uncontrolled diabetes and recent infection.

## 2015-05-07 ENCOUNTER — Ambulatory Visit (INDEPENDENT_AMBULATORY_CARE_PROVIDER_SITE_OTHER): Payer: 59 | Admitting: Physician Assistant

## 2015-05-07 ENCOUNTER — Encounter: Payer: Self-pay | Admitting: Physician Assistant

## 2015-05-07 NOTE — Progress Notes (Signed)
Erroneous encounter. Disregard.

## 2015-05-07 NOTE — Progress Notes (Signed)
Pre visit review using our clinic review tool, if applicable. No additional management support is needed unless otherwise documented below in the visit note. 

## 2015-05-15 ENCOUNTER — Other Ambulatory Visit: Payer: Self-pay | Admitting: Physician Assistant

## 2015-05-16 NOTE — Telephone Encounter (Signed)
Last filled: 02/05/15 Amt: 30, 2 Last OV:  05/04/15  Med filled.

## 2015-05-23 ENCOUNTER — Emergency Department
Admission: EM | Admit: 2015-05-23 | Discharge: 2015-05-23 | Disposition: A | Discharge status: Home / Self Care | Payer: 59 | Attending: Emergency Medicine | Admitting: Emergency Medicine

## 2015-05-23 ENCOUNTER — Encounter: Payer: Self-pay | Admitting: Emergency Medicine

## 2015-05-23 ENCOUNTER — Emergency Department: Payer: 59

## 2015-05-23 DIAGNOSIS — Z792 Long term (current) use of antibiotics: Secondary | ICD-10-CM | POA: Insufficient documentation

## 2015-05-23 DIAGNOSIS — R600 Localized edema: Secondary | ICD-10-CM

## 2015-05-23 DIAGNOSIS — M79671 Pain in right foot: Secondary | ICD-10-CM | POA: Diagnosis present

## 2015-05-23 DIAGNOSIS — F909 Attention-deficit hyperactivity disorder, unspecified type: Secondary | ICD-10-CM | POA: Diagnosis not present

## 2015-05-23 DIAGNOSIS — Z7951 Long term (current) use of inhaled steroids: Secondary | ICD-10-CM | POA: Diagnosis not present

## 2015-05-23 DIAGNOSIS — E785 Hyperlipidemia, unspecified: Secondary | ICD-10-CM | POA: Diagnosis not present

## 2015-05-23 DIAGNOSIS — R609 Edema, unspecified: Secondary | ICD-10-CM

## 2015-05-23 DIAGNOSIS — E1140 Type 2 diabetes mellitus with diabetic neuropathy, unspecified: Secondary | ICD-10-CM | POA: Insufficient documentation

## 2015-05-23 DIAGNOSIS — I10 Essential (primary) hypertension: Secondary | ICD-10-CM | POA: Insufficient documentation

## 2015-05-23 DIAGNOSIS — L03115 Cellulitis of right lower limb: Secondary | ICD-10-CM | POA: Diagnosis not present

## 2015-05-23 DIAGNOSIS — Z794 Long term (current) use of insulin: Secondary | ICD-10-CM | POA: Insufficient documentation

## 2015-05-23 DIAGNOSIS — Z79899 Other long term (current) drug therapy: Secondary | ICD-10-CM | POA: Diagnosis not present

## 2015-05-23 DIAGNOSIS — Z7982 Long term (current) use of aspirin: Secondary | ICD-10-CM | POA: Diagnosis not present

## 2015-05-23 LAB — CBC WITH DIFFERENTIAL/PLATELET
Basophils Absolute: 0 10*3/uL (ref 0–0.1)
Basophils Relative: 1 %
Eosinophils Absolute: 0.1 10*3/uL (ref 0–0.7)
Eosinophils Relative: 2 %
HCT: 40 % (ref 40.0–52.0)
HEMOGLOBIN: 12.9 g/dL — AB (ref 13.0–18.0)
LYMPHS ABS: 1.7 10*3/uL (ref 1.0–3.6)
Lymphocytes Relative: 30 %
MCH: 25.5 pg — ABNORMAL LOW (ref 26.0–34.0)
MCHC: 32.2 g/dL (ref 32.0–36.0)
MCV: 79.2 fL — ABNORMAL LOW (ref 80.0–100.0)
MONOS PCT: 7 %
Monocytes Absolute: 0.4 10*3/uL (ref 0.2–1.0)
NEUTROS ABS: 3.5 10*3/uL (ref 1.4–6.5)
NEUTROS PCT: 60 %
Platelets: 294 10*3/uL (ref 150–440)
RBC: 5.05 MIL/uL (ref 4.40–5.90)
RDW: 14.6 % — ABNORMAL HIGH (ref 11.5–14.5)
WBC: 5.7 10*3/uL (ref 3.8–10.6)

## 2015-05-23 LAB — BASIC METABOLIC PANEL
ANION GAP: 11 (ref 5–15)
BUN: 10 mg/dL (ref 6–20)
CALCIUM: 9 mg/dL (ref 8.9–10.3)
CO2: 24 mmol/L (ref 22–32)
CREATININE: 0.92 mg/dL (ref 0.61–1.24)
Chloride: 101 mmol/L (ref 101–111)
GFR calc Af Amer: >60 mL/min (ref >60)
Glucose, Bld: 182 mg/dL — ABNORMAL HIGH (ref 65–99)
Potassium: 4.2 mmol/L (ref 3.5–5.1)
Sodium: 136 mmol/L (ref 135–145)

## 2015-05-23 MED ORDER — CLINDAMYCIN HCL 150 MG PO CAPS: 150.0000 mg | ORAL_CAPSULE | Freq: Four times a day (QID) | ORAL | Status: DC

## 2015-05-23 MED ORDER — CLINDAMYCIN PHOSPHATE 600 MG/50ML IV SOLN
600.0000 mg | Freq: Once | INTRAVENOUS | Status: AC
  Administered 2015-05-23: 600 mg via INTRAVENOUS
  Filled 2015-05-23: qty 50

## 2015-05-23 NOTE — ED Notes (Signed)
States he had foot infection and was placed on antibiotics.. Now foot is swollen

## 2015-05-23 NOTE — ED Notes (Signed)
Pt has swelling and redness to RLE. Pt has wound noted to RLE and states he was recently treated for an infection.

## 2015-05-23 NOTE — ED Provider Notes (Signed)
[facility] Emergency Department Provider Note  ____________________________________________  Time seen: Approximately 2:35 PM  I have reviewed the triage vital signs and the nursing notes.   HISTORY  Chief Complaint Foot Pain    HPI Steve [name] is a 54 y.o. male patient complain of redness and swelling to the right foot. Patient had a slow healing wound to right lower leg which is complicated by resistance to Keflex and after 7 days the patient was changed to doxycycline. Patient state half of his sutures removed status post 10 days of closure and the rest of the suture removal on day 13. Patient states continue having pain and swelling distal to the wound site as consolidated into his right foot.Patient stated after he finished the doxycycline and no center was no improvement he finished the last 3 days of the Keflex which he had left from the previous prescription.   Past Medical History  Diagnosis Date  . Diabetes mellitus without complication   . Hypertension   . Hyperlipidemia   . Complication of anesthesia     has night terrors after anesthesia but can be violent when waking up  . 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 Pt stated "I forget alot of things"  . Frequent urination at night     when blood sugar runs high  . Psoriasis of scalp     nose and face; occurs mainly in winter   . Seasonal allergies   . CAD (coronary artery disease)   . Headache(784.0)   . Sinus infection 12/16/13  . ADHD (attention deficit hyperactivity disorder)   . Allergy     Patient Active Problem List   Diagnosis Date Noted  . Infected wound 05/06/2015  . Numbness of left foot 03/23/2015  . Acute bacterial sinusitis 11/01/2014  . Cellulitis of leg, left 08/01/2014  . Accessory skin tags 08/01/2014  . Hypogonadism in male 07/24/2014  . Essential hypertension, benign 07/24/2014  . Sinusitis, chronic 07/24/2014  . Post herpetic neuralgia 05/05/2014   . Skin lesion 04/09/2014  . Cough 12/21/2013  . Gout 12/06/2013  . Allergic rhinitis 09/07/2013  . Tachycardia 08/05/2013  . Atypical chest pain 07/15/2013  . Left ankle pain 06/14/2013  . Rib injury 06/14/2013  . Pain in left ankle w/ effusion 05/24/2013  . Routine general medical examination at a health care facility 04/11/2013  . DM neuropathy, type II diabetes mellitus 07/26/2012  . Hyperlipidemia 07/26/2012  . 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 ED (erectile dysfunction) 07/26/2012  . Hypogonadism male 07/26/2012    Past Surgical History  Procedure Laterality Date  . Hernia repair  2010    abdominal hernia  . Knee arthroscopy  2012    left knee  . Cardiac catheterization      no PCI approx 10 years ago - Bushyhead surgery      right ring finger  . Knee surgery    . 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 aspirin EC 325 MG tablet   Oral   Take 1 tablet (325 mg total) by mouth daily.   30 tablet   0   . atorvastatin (LIPITOR) 10 MG tablet  TAKE ONE TABLET BY MOUTH ONCE DAILY   30 tablet   2     Please schedule follow up appointment with PCP soo ...   . doxycycline (VIBRAMYCIN) 100 MG capsule   Oral   Take 1 capsule (100 mg total) by mouth 2 (two) times daily.   14 capsule   0   . fluticasone (FLONASE) 50 MCG/ACT nasal spray   Each Nare   Place 2 sprays into both nostrils daily. Patient taking differently: Place 2 sprays into both nostrils as needed for allergies.    16 g   6   . gabapentin (NEURONTIN) 300 MG capsule   Oral   Take 1 capsule (300 mg total) by mouth 3 (three) times daily.   90 capsule   0   . insulin aspart (NOVOLOG FLEXPEN) 100 UNIT/ML FlexPen   Subcutaneous   Inject 0-12 Units into the skin 3 (three)  times daily with meals. sliding scale- check sugar and inject 3 times daily before meals as below:  <150-   Zero units 150-200 2 units 201-250 4 units 251-300 6 units 301-350 8 units 351-400 10 units >400             12 units and contact us.   15 mL   0   . insulin NPH Human (HUMULIN N,NOVOLIN N) 100 UNIT/ML injection      30 units in the morning and 30-40 units at bedtime.   10 mL   2   . lisinopril (PRINIVIL,ZESTRIL) 5 MG tablet   Oral   Take 1 tablet (5 mg total) by mouth daily.   30 tablet   0   . metFORMIN (GLUCOPHAGE) 1000 MG tablet      TAKE ONE TABLET BY MOUTH TWICE DAILY WITH MEALS   60 tablet   0   . Multiple Vitamins-Minerals (ZINC PO)   Oral   Take 1 tablet by mouth daily.          . sitaGLIPtin (JANUVIA) 100 MG tablet      TAKE 1 TABLET BY MOUTH DAILY.   30 tablet   0     Allergies Sulfa antibiotics  Family History  Problem Relation Age of Onset  . Diabetes Mother   . Cancer Mother     history breast cancer  . Hypertension Mother   . Cancer Father     prostate  . Alzheimer's disease Father   . Diabetes Father   . Hypertension Father   . Cancer Maternal Uncle     colon  . Colon cancer Maternal Uncle 6  . Cancer Maternal Grandmother     breast  . Cancer Maternal Grandfather     prostate  . Cancer Maternal Uncle     prostate  . Colon cancer Maternal Uncle 16  . Cancer Maternal Uncle     prostate  . Cancer Cousin 47    metastatic colon cancer?  . Colon polyps Paternal Uncle   . Esophageal cancer Neg Hx   . Stomach cancer Neg Hx   . Rectal cancer Neg Hx     Social History Social History  Substance Use Topics  . Smoking status: Never Smoker   . Smokeless tobacco: Never Used  . Alcohol Use: 0.6 - 1.2 oz/week    1-2 Cans of beer per week     Comment: rare beer    Review of Systems Constitutional: No fever/chills Eyes: No visual changes. ENT: No sore throat. Cardiovascular: Denies chest pain. Respiratory: Denies  shortness of breath. Gastrointestinal: No abdominal pain.  No nausea, no vomiting.  No diarrhea.  No constipation. Genitourinary: Negative for dysuria. Musculoskeletal: Negative for back pain. Skin: Negative for rash. Neurological: Negative for headaches, focal weakness or numbness. Psychiatric:ADHD Endocrine:Diabetes, hypertension, and hyperlipidemia. Hematological/Lymphatic: Allergic/Immunilogical: Sulfa medications 10-point ROS otherwise negative.  ____________________________________________   PHYSICAL EXAM:  VITAL SIGNS: ED Triage Vitals  Enc Vitals Group     BP 05/23/15 1336 151/73 mmHg     Pulse Rate 05/23/15 1336 89     Resp 05/23/15 1336 16     Temp 05/23/15 1336 97.5 F (36.4 C)     Temp Source 05/23/15 1336 Oral     SpO2 05/23/15 1336 98 %     Weight 05/23/15 1336 270 lb (122.471 kg)     Height 05/23/15 1336 6\' 4"  (1.93 m)     Head Cir --      Peak Flow --      Pain Score --      Pain Loc --      Pain Edu? --      Excl. in [place]? --     Constitutional: Alert and oriented. Well appearing and in no acute distress. Eyes: Conjunctivae are normal. PERRL. EOMI. Head: Atraumatic. Nose: No congestion/rhinnorhea. Mouth/Throat: Mucous membranes are moist.  Oropharynx non-erythematous. Neck: No stridor.  No cervical spine tenderness to palpation. Hematological/Lymphatic/Immunilogical: No cervical lymphadenopathy. Cardiovascular: Normal rate, regular rhythm. Grossly normal heart sounds.  Good peripheral circulation. Respiratory: Normal respiratory effort.  No retractions. Lungs CTAB. Gastrointestinal: Soft and nontender. No distention. No abdominal bruits. No CVA tenderness. Musculoskeletal: Moderate edema to the right foot. Moderate guarding with palpation of the dorsal aspect of the right foot.  No joint effusions. Neurologic:  Normal speech and language. No gross focal neurologic deficits are appreciated. No gait instability. Skin:  Edema and erythema.   Psychiatric: Mood and affect are normal. Speech and behavior are normal.  ____________________________________________   LABS (all labs ordered are listed, but only abnormal results are displayed)  Labs Reviewed  BASIC METABOLIC PANEL  CBC WITH DIFFERENTIAL/PLATELET   ____________________________________________  EKG   ____________________________________________  RADIOLOGY  Ultrasound was negative for any DVT. ____________________________________________   PROCEDURES  Procedure(s) performed: None  Critical Care performed: No  ____________________________________________   INITIAL IMPRESSION / ASSESSMENT AND PLAN / ED COURSE  Pertinent labs & imaging results that were available during my care of the patient were reviewed by me and considered in my medical decision making (see chart for details).  Cellulitis right lower extremity. Patient given IV clindamycin and prescription for oral clindamycin upon discharge. Patient given home care instructions. Patient advised follow-up with scheduled family doctor appointment in 2 days. Patient advised return to ER if condition worsens. ____________________________________________   FINAL CLINICAL IMPRESSION(S) / ED DIAGNOSES  Final diagnoses:  Edema      Sable Feil, PA-C 05/23/15 1635  Lisa Roca, MD 05/25/15 1311

## 2015-05-25 ENCOUNTER — Ambulatory Visit: Payer: 59 | Admitting: Physician Assistant

## 2015-05-25 ENCOUNTER — Telehealth: Payer: Self-pay | Admitting: Physician Assistant

## 2015-05-25 NOTE — Telephone Encounter (Signed)
Patient left voicemail 05/24/15 canceling appointment for 05/25/15- charge or no charge

## 2015-05-25 NOTE — Telephone Encounter (Signed)
No charge. 

## 2015-05-28 ENCOUNTER — Other Ambulatory Visit: Payer: Self-pay | Admitting: Physician Assistant

## 2015-06-13 ENCOUNTER — Ambulatory Visit (INDEPENDENT_AMBULATORY_CARE_PROVIDER_SITE_OTHER): Payer: 59 | Admitting: Physician Assistant

## 2015-06-13 ENCOUNTER — Other Ambulatory Visit: Payer: Self-pay | Admitting: Physician Assistant

## 2015-06-13 ENCOUNTER — Ambulatory Visit (HOSPITAL_BASED_OUTPATIENT_CLINIC_OR_DEPARTMENT_OTHER)
Admission: RE | Admit: 2015-06-13 | Discharge: 2015-06-13 | Disposition: A | Discharge status: Home / Self Care | Payer: 59 | Source: Ambulatory Visit | Attending: Physician Assistant | Admitting: Physician Assistant

## 2015-06-13 ENCOUNTER — Encounter: Payer: Self-pay | Admitting: Physician Assistant

## 2015-06-13 VITALS — BP 124/84 | HR 109 | Temp 98.1°F | Resp 16 | Ht 76.0 in | Wt 268.4 lb

## 2015-06-13 DIAGNOSIS — R2241 Localized swelling, mass and lump, right lower limb: Secondary | ICD-10-CM

## 2015-06-13 DIAGNOSIS — Z23 Encounter for immunization: Secondary | ICD-10-CM | POA: Diagnosis not present

## 2015-06-13 MED ORDER — INFLUENZA VAC SPLIT QUAD 0.5 ML IM SUSY
0.5000 mL | PREFILLED_SYRINGE | Freq: Once | INTRAMUSCULAR | Status: AC
  Administered 2015-06-13: 0.5 mL via INTRAMUSCULAR

## 2015-06-13 NOTE — Progress Notes (Signed)
Pre visit review using our clinic review tool, if applicable. No additional management support is needed unless otherwise documented below in the visit note/SLS  

## 2015-06-13 NOTE — Patient Instructions (Signed)
Please continue medications as directed. Your diet is the main issue with your blood sugar levels.  Stop the [employer] trips!!! Eat a healthy meal or snack every 4 hours to keep sugar stable and at a reasonable range. Increase exercise.  Keep checking sugar daily. Return to lab on or after 06/24/15 for repeat blood work.  You will be contacted for an [place]. I will call you with your results.

## 2015-06-13 NOTE — Progress Notes (Signed)
Patient presents to clinic today c/o continued swelling distally to site of previous laceration. Was seen in ER 3 weeks ago due to recurrent cellulitis of extremity at site of laceration, treated with IV clindamycin followed by PO course. Patient endorses completing course as directed. Denies residual tenderness, fever, chills, redness or warmth. Notes swelling has remind and is like a soft lump that is tender only if "pressed hard". To review, patient acquired laceration due to a glass window pane falling and shearing skin before breaking.  Past Medical History  Diagnosis Date  . Diabetes mellitus without complication   . Hypertension   . Hyperlipidemia   . Complication of anesthesia     has night terrors after anesthesia but can be violent when waking up  . 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 Pt stated "I forget alot of things"  . Frequent urination at night     when blood sugar runs high  . Psoriasis of scalp     nose and face; occurs mainly in winter   . Seasonal allergies   . CAD (coronary artery disease)   . Headache(784.0)   . Sinus infection 12/16/13  . ADHD (attention deficit hyperactivity disorder)   . Allergy     Current Outpatient Prescriptions on File Prior to Visit  Medication Sig Dispense Refill  . aspirin EC 325 MG tablet Take 1 tablet (325 mg total) by mouth daily. 30 tablet 0  . atorvastatin (LIPITOR) 10 MG tablet TAKE ONE TABLET BY MOUTH ONCE DAILY 30 tablet 2  . fluticasone (FLONASE) 50 MCG/ACT nasal spray Place 2 sprays into both nostrils daily. (Patient taking differently: Place 2 sprays into both nostrils as needed for allergies. ) 16 g 6  . gabapentin (NEURONTIN) 300 MG capsule Take 1 capsule (300 mg total) by mouth 3 (three) times daily. 90 capsule 0  . insulin aspart (NOVOLOG FLEXPEN) 100 UNIT/ML FlexPen Inject 0-12 Units into the skin 3 (three) times daily with meals. sliding scale- check sugar and inject 3 times daily before meals as  below:  <150-   Zero units 150-200 2 units 201-250 4 units 251-300 6 units 301-350 8 units 351-400 10 units >400             12 units and contact us. 15 mL 0  . insulin NPH Human (HUMULIN N,NOVOLIN N) 100 UNIT/ML injection 30 units in the morning and 30-40 units at bedtime. 10 mL 2  . lisinopril (PRINIVIL,ZESTRIL) 5 MG tablet Take 1 tablet (5 mg total) by mouth daily. 30 tablet 0  . metFORMIN (GLUCOPHAGE) 1000 MG tablet TAKE ONE TABLET BY MOUTH TWICE DAILY WITH MEALS 60 tablet 3  . Multiple Vitamins-Minerals (ZINC PO) Take 1 tablet by mouth daily.     . sitaGLIPtin (JANUVIA) 100 MG tablet TAKE 1 TABLET BY MOUTH DAILY. 30 tablet 0   No current facility-administered medications on file prior to visit.    Allergies  Allergen Reactions  . Sulfa Antibiotics Itching and Other (See Comments)    Burning also    Family History  Problem Relation Age of Onset  . Diabetes Mother   . Cancer Mother     history breast cancer  . Hypertension Mother   . Cancer Father     prostate  . Alzheimer's disease Father   . Diabetes Father   . Hypertension Father   . Cancer Maternal Uncle     colon  . Colon cancer Maternal Uncle  40  . Cancer Maternal Grandmother     breast  . Cancer Maternal Grandfather     prostate  . Cancer Maternal Uncle     prostate  . Colon cancer Maternal Uncle 25  . Cancer Maternal Uncle     prostate  . Cancer Cousin 47    metastatic colon cancer?  . Colon polyps Paternal Uncle   . Esophageal cancer Neg Hx   . Stomach cancer Neg Hx   . Rectal cancer Neg Hx     Social History   Social History  . Marital Status: Married    Spouse Name: N/A  . Number of Children: 6  . Years of Education: N/A   Social History Main Topics  . Smoking status: Never Smoker   . Smokeless tobacco: Never Used  . Alcohol Use: 0.6 - 1.2 oz/week    1-2 Cans of beer per week     Comment: rare beer  . Drug Use: No  . Sexual Activity:    Partners: Female   Other Topics Concern  .  None   Social History Narrative   Regular exercise: very little   Caffeine use: sweet tea daily   6 children   Works in Architect, owns concession.   Married   Enjoys televition.      Review of Systems - See HPI.  All other ROS are negative.  BP 124/84 mmHg  Pulse 109  Temp(Src) 98.1 F (36.7 C) (Oral)  Resp 16  Ht _0  (1.93 m)  Wt 268 lb 6 oz (121.734 kg)  BMI 32.68 kg/m2  SpO2 97%  Physical Exam  Constitutional: He is oriented to person, place, and time and well-developed, well-nourished, and in no distress.  HENT:  Head: Normocephalic and atraumatic.  Eyes: Conjunctivae are normal.  Cardiovascular: Normal rate, regular rhythm, normal heart sounds and intact distal pulses.   Pulmonary/Chest: Effort normal and breath sounds normal. No respiratory distress. He has no wheezes. He has no rales. He exhibits no tenderness.  Neurological: He is alert and oriented to person, place, and time.  Skin:     Vitals reviewed.   Recent Results (from the past 2160 hour(s))  Hemoglobin A1c     Status: Abnormal   Collection Time: 03/23/15 11:45 AM  Result Value Ref Range   Hgb A1c MFr Bld 9.4 (H) 4.6 - 6.5 %    Comment: Glycemic Control Guidelines for People with Diabetes:Non Diabetic:  <6%Goal of Therapy: <7%Additional Action Suggested:  >8%   Lipid panel     Status: Abnormal   Collection Time: 03/23/15 11:45 AM  Result Value Ref Range   Cholesterol 139 0 - 200 mg/dL    Comment: ATP III Classification       Desirable:  < 200 mg/dL               Borderline High:  200 - 239 mg/dL          High:  > = 240 mg/dL   Triglycerides 504.0 (H) 0.0 - 149.0 mg/dL    Comment: Normal:  <150 mg/dLBorderline High:  150 - 199 mg/dLTriglyceride is over 400; calculations on Lipids are invalid.   HDL 32.60 (L) >39.00 mg/dL   Total CHOL/HDL Ratio 4     Comment:                Men          Women1/2 Average Risk     3.4  3.3Average Risk          5.0          4.42X Average Risk          9.6           7.13X Average Risk          15.0          11.0                      Basic metabolic panel     Status: Abnormal   Collection Time: 03/23/15 11:45 AM  Result Value Ref Range   Sodium 139 135 - 145 mEq/L   Potassium 4.2 3.5 - 5.1 mEq/L   Chloride 104 96 - 112 mEq/L   CO2 21 19 - 32 mEq/L   Glucose, Bld 205 (H) 70 - 99 mg/dL   BUN 11 6 - 23 mg/dL   Creatinine, Ser 0.86 0.40 - 1.50 mg/dL   Calcium 9.1 8.4 - 10.5 mg/dL   GFR 99.27 >60.00 mL/min  Hepatic function panel     Status: None   Collection Time: 03/23/15 11:45 AM  Result Value Ref Range   Total Bilirubin 0.4 0.2 - 1.2 mg/dL   Bilirubin, Direct 0.0 0.0 - 0.3 mg/dL   Alkaline Phosphatase 82 39 - 117 U/L   AST 29 0 - 37 U/L   ALT 36 0 - 53 U/L   Total Protein 6.7 6.0 - 8.3 g/dL   Albumin 4.2 3.5 - 5.2 g/dL  TSH     Status: None   Collection Time: 03/23/15 11:45 AM  Result Value Ref Range   TSH 1.20 0.35 - 4.50 uIU/mL  CBC with Differential/Platelet     Status: None   Collection Time: 03/23/15 11:45 AM  Result Value Ref Range   WBC 5.6 4.0 - 10.5 K/uL   RBC 5.09 4.22 - 5.81 Mil/uL   Hemoglobin 13.2 13.0 - 17.0 g/dL   HCT 40.4 39.0 - 52.0 %   MCV 79.3 78.0 - 100.0 fl   MCHC 32.8 30.0 - 36.0 g/dL   RDW 14.1 11.5 - 15.5 %   Platelets 263.0 150.0 - 400.0 K/uL   Neutrophils Relative % 63.5 43.0 - 77.0 %   Lymphocytes Relative 28.5 12.0 - 46.0 %   Monocytes Relative 6.0 3.0 - 12.0 %   Eosinophils Relative 1.7 0.0 - 5.0 %   Basophils Relative 0.3 0.0 - 3.0 %   Neutro Abs 3.6 1.4 - 7.7 K/uL   Lymphs Abs 1.6 0.7 - 4.0 K/uL   Monocytes Absolute 0.3 0.1 - 1.0 K/uL   Eosinophils Absolute 0.1 0.0 - 0.7 K/uL   Basophils Absolute 0.0 0.0 - 0.1 K/uL  B12 and Folate Panel     Status: Abnormal   Collection Time: 03/23/15 11:45 AM  Result Value Ref Range   Vitamin B-12 206 (L) 211 - 911 pg/mL   Folate 24.4 >5.9 ng/mL  LDL cholesterol, direct     Status: None   Collection Time: 03/23/15 11:45 AM  Result Value Ref Range    Direct LDL 52.0 mg/dL    Comment: Optimal:  <100 mg/dLNear or Above Optimal:  100-129 mg/dLBorderline High:  130-159 mg/dLHigh:  160-189 mg/dLVery High:  >190 mg/dL  Basic metabolic panel     Status: Abnormal   Collection Time: 05/23/15  2:56 PM  Result Value Ref Range   Sodium 136 135 - 145 mmol/L   Potassium 4.2 3.5 - 5.1  mmol/L   Chloride 101 101 - 111 mmol/L   CO2 24 22 - 32 mmol/L   Glucose, Bld 182 (H) 65 - 99 mg/dL   BUN 10 6 - 20 mg/dL   Creatinine, Ser 0.92 0.61 - 1.24 mg/dL   Calcium 9.0 8.9 - 10.3 mg/dL   GFR calc non Af Amer >60 >60 mL/min   GFR calc Af Amer >60 >60 mL/min    Comment: (NOTE) The eGFR has been calculated using the CKD EPI equation. This calculation has not been validated in all clinical situations. eGFR's persistently <60 mL/min signify possible Chronic Kidney Disease.    Anion gap 11 5 - 15  CBC with Differential     Status: Abnormal   Collection Time: 05/23/15  2:56 PM  Result Value Ref Range   WBC 5.7 3.8 - 10.6 K/uL   RBC 5.05 4.40 - 5.90 MIL/uL   Hemoglobin 12.9 (L) 13.0 - 18.0 g/dL   HCT 40.0 40.0 - 52.0 %   MCV 79.2 (L) 80.0 - 100.0 fL   MCH 25.5 (L) 26.0 - 34.0 pg   MCHC 32.2 32.0 - 36.0 g/dL   RDW 14.6 (H) 11.5 - 14.5 %   Platelets 294 150 - 440 K/uL   Neutrophils Relative % 60 %   Neutro Abs 3.5 1.4 - 6.5 K/uL   Lymphocytes Relative 30 %   Lymphs Abs 1.7 1.0 - 3.6 K/uL   Monocytes Relative 7 %   Monocytes Absolute 0.4 0.2 - 1.0 K/uL   Eosinophils Relative 2 %   Eosinophils Absolute 0.1 0 - 0.7 K/uL   Basophils Relative 1 %   Basophils Absolute 0.0 0 - 0.1 K/uL    Assessment/Plan: Mass of leg Hematoma although giving persistence needs imaging. Will check [place] to assess mass. If hematoma not found, will proceed with further imaging or general surgery referral as there is some concern of a foreign body within tissue.

## 2015-06-18 DIAGNOSIS — R2240 Localized swelling, mass and lump, unspecified lower limb: Secondary | ICD-10-CM | POA: Insufficient documentation

## 2015-06-18 NOTE — Assessment & Plan Note (Signed)
Hematoma although giving persistence needs imaging. Will check [place] to assess mass. If hematoma not found, will proceed with further imaging or general surgery referral as there is some concern of a foreign body within tissue.

## 2015-06-25 ENCOUNTER — Other Ambulatory Visit: Payer: Self-pay | Admitting: Physician Assistant

## 2015-06-25 DIAGNOSIS — S80859S Superficial foreign body, unspecified lower leg, sequela: Secondary | ICD-10-CM

## 2015-06-26 ENCOUNTER — Telehealth: Payer: Self-pay | Admitting: Physician Assistant

## 2015-06-26 NOTE — Telephone Encounter (Signed)
Discussed results with patient. Will be contacted by General surgery.

## 2015-06-26 NOTE — Telephone Encounter (Signed)
Please call patient to review [place] results and recommendations. A General Surgery referral already placed for assessment of possible foreign body in the swollen area of leg

## 2015-06-26 NOTE — Telephone Encounter (Signed)
Pt called in to see if his results are back from his x-ray.    Pt call back number: [phone]

## 2015-06-29 ENCOUNTER — Other Ambulatory Visit: Payer: 59

## 2015-07-10 ENCOUNTER — Telehealth: Payer: Self-pay | Admitting: Physician Assistant

## 2015-07-10 DIAGNOSIS — E119 Type 2 diabetes mellitus without complications: Secondary | ICD-10-CM

## 2015-07-10 DIAGNOSIS — Z794 Long term (current) use of insulin: Principal | ICD-10-CM

## 2015-07-10 NOTE — Telephone Encounter (Signed)
Relation to pt: self  Call back number: [phone]   Reason for call:   Patient would like labs done to check his diabetes requesting orders. In addition patient wanted to inform Dr. Luetta Nutting general surgeon suggested patient to wear leg stockings. Please advise

## 2015-07-10 NOTE — Telephone Encounter (Signed)
Patient informed, understood & agreed lab orders have been placed; lab appt scheduled for Wed, 07/11/15 at 9:15a/SLS

## 2015-07-11 ENCOUNTER — Other Ambulatory Visit (INDEPENDENT_AMBULATORY_CARE_PROVIDER_SITE_OTHER): Payer: 59

## 2015-07-11 ENCOUNTER — Telehealth: Payer: Self-pay | Admitting: Physician Assistant

## 2015-07-11 DIAGNOSIS — E119 Type 2 diabetes mellitus without complications: Secondary | ICD-10-CM | POA: Diagnosis not present

## 2015-07-11 DIAGNOSIS — Z794 Long term (current) use of insulin: Secondary | ICD-10-CM | POA: Diagnosis not present

## 2015-07-11 LAB — COMPREHENSIVE METABOLIC PANEL
ALBUMIN: 4.1 g/dL (ref 3.5–5.2)
ALT: 32 U/L (ref 0–53)
AST: 23 U/L (ref 0–37)
Alkaline Phosphatase: 60 U/L (ref 39–117)
BILIRUBIN TOTAL: 0.5 mg/dL (ref 0.2–1.2)
BUN: 8 mg/dL (ref 6–23)
CALCIUM: 9.6 mg/dL (ref 8.4–10.5)
CHLORIDE: 102 meq/L (ref 96–112)
CO2: 29 meq/L (ref 19–32)
Creatinine, Ser: 0.77 mg/dL (ref 0.40–1.50)
GFR: 112.64 mL/min (ref >60.00)
Glucose, Bld: 167 mg/dL — ABNORMAL HIGH (ref 70–99)
Potassium: 4.3 mEq/L (ref 3.5–5.1)
SODIUM: 139 meq/L (ref 135–145)
Total Protein: 6.7 g/dL (ref 6.0–8.3)

## 2015-07-11 LAB — HEMOGLOBIN A1C: HEMOGLOBIN A1C: 8.4 % — AB (ref 4.6–6.5)

## 2015-07-11 NOTE — Addendum Note (Signed)
Addended by: Rockwell Germany on: 07/11/2015 09:05 AM   Modules accepted: Orders

## 2015-07-11 NOTE — Telephone Encounter (Signed)
Relation to KP:QAES Call back number: [phone] Pharmacy: [facility] ([facility] [place] - [address] [phone] (Phone) [phone] (Fax)         Reason for call:   Patient requesting a refill of the following medication:  insulin NPH Human (HUMULIN N,NOVOLIN N) 100 UNIT/ML injection  insulin aspart (NOVOLOG FLEXPEN) 100 UNIT/ML FlexPen  gabapentin (NEURONTIN) 300 MG capsule  atorvastatin (LIPITOR) 10 MG tablet  lisinopril (PRINIVIL,ZESTRIL) 5 MG tablet

## 2015-07-11 NOTE — Telephone Encounter (Signed)
Corrected lab orders for future/SLS

## 2015-07-12 ENCOUNTER — Other Ambulatory Visit: Payer: Self-pay

## 2015-07-12 DIAGNOSIS — I10 Essential (primary) hypertension: Secondary | ICD-10-CM

## 2015-07-12 MED ORDER — GABAPENTIN 300 MG PO CAPS: 300.0000 mg | ORAL_CAPSULE | Freq: Three times a day (TID) | ORAL | Status: DC

## 2015-07-12 MED ORDER — ATORVASTATIN CALCIUM 10 MG PO TABS: 10.0000 mg | ORAL_TABLET | Freq: Every day | ORAL | Status: DC

## 2015-07-12 MED ORDER — INSULIN ASPART 100 UNIT/ML FLEXPEN: 0.0000 [IU] | PEN_INJECTOR | Freq: Three times a day (TID) | SUBCUTANEOUS | Status: DC

## 2015-07-12 MED ORDER — LISINOPRIL 5 MG PO TABS: 5.0000 mg | ORAL_TABLET | Freq: Every day | ORAL | Status: DC

## 2015-07-12 MED ORDER — INSULIN NPH (HUMAN) (ISOPHANE) 100 UNIT/ML ~~LOC~~ SUSP: SUBCUTANEOUS | Status: DC

## 2015-07-12 NOTE — Telephone Encounter (Signed)
Sent refill requests to Dr. Etter Sjogren covering for Raiford Noble, PA

## 2015-07-13 ENCOUNTER — Telehealth: Payer: Self-pay | Admitting: *Deleted

## 2015-07-13 NOTE — Telephone Encounter (Signed)
PA for NovoLog FlexPen initiated. Awaiting determination. JG//CMA

## 2015-07-20 ENCOUNTER — Telehealth: Payer: Self-pay | Admitting: Physician Assistant

## 2015-07-20 DIAGNOSIS — Z9109 Other allergy status, other than to drugs and biological substances: Secondary | ICD-10-CM

## 2015-07-20 DIAGNOSIS — J329 Chronic sinusitis, unspecified: Secondary | ICD-10-CM

## 2015-07-20 NOTE — Telephone Encounter (Signed)
Referral placed.

## 2015-07-20 NOTE — Telephone Encounter (Signed)
Relation to KF:MMCR Call back number:[phone]   Reason for call:  Patient requesting a referral ENT due to allergies

## 2015-07-25 MED ORDER — INSULIN LISPRO 100 UNIT/ML (KWIKPEN): PEN_INJECTOR | SUBCUTANEOUS | Status: DC

## 2015-07-25 NOTE — Telephone Encounter (Signed)
Humalog Kwikpen sent to [facility] on [address]. Pt aware. No further questions/concerns at this time. JG//CMA

## 2015-07-25 NOTE — Telephone Encounter (Signed)
PA denied and can be approved if pt has tried and failed Humalog. Covered alternative is Humalog. Please advise. JG//CMA

## 2015-07-25 NOTE — Telephone Encounter (Signed)
Ok to send in Rx for Humalog at same dose and sig. Please inform patient.

## 2015-07-25 NOTE — Addendum Note (Signed)
Addended by: Murtis Sink A on: 07/25/2015 10:52 AM   Modules accepted: Orders, Medications

## 2015-07-26 ENCOUNTER — Ambulatory Visit (HOSPITAL_BASED_OUTPATIENT_CLINIC_OR_DEPARTMENT_OTHER)
Admission: RE | Admit: 2015-07-26 | Discharge: 2015-07-26 | Disposition: A | Discharge status: Home / Self Care | Payer: 59 | Source: Ambulatory Visit | Attending: Family Medicine | Admitting: Family Medicine

## 2015-07-26 ENCOUNTER — Ambulatory Visit (INDEPENDENT_AMBULATORY_CARE_PROVIDER_SITE_OTHER): Payer: 59 | Admitting: Family Medicine

## 2015-07-26 ENCOUNTER — Encounter: Payer: Self-pay | Admitting: Family Medicine

## 2015-07-26 VITALS — BP 130/82 | HR 118 | Temp 97.8°F | Wt 265.8 lb

## 2015-07-26 DIAGNOSIS — K760 Fatty (change of) liver, not elsewhere classified: Secondary | ICD-10-CM | POA: Diagnosis not present

## 2015-07-26 DIAGNOSIS — R112 Nausea with vomiting, unspecified: Secondary | ICD-10-CM | POA: Diagnosis not present

## 2015-07-26 DIAGNOSIS — I10 Essential (primary) hypertension: Secondary | ICD-10-CM | POA: Diagnosis not present

## 2015-07-26 DIAGNOSIS — R1114 Bilious vomiting: Secondary | ICD-10-CM

## 2015-07-26 DIAGNOSIS — R1010 Upper abdominal pain, unspecified: Secondary | ICD-10-CM

## 2015-07-26 DIAGNOSIS — R109 Unspecified abdominal pain: Secondary | ICD-10-CM | POA: Insufficient documentation

## 2015-07-26 DIAGNOSIS — E119 Type 2 diabetes mellitus without complications: Secondary | ICD-10-CM | POA: Insufficient documentation

## 2015-07-26 LAB — COMPREHENSIVE METABOLIC PANEL
ALBUMIN: 4.3 g/dL (ref 3.5–5.2)
ALK PHOS: 71 U/L (ref 39–117)
ALT: 35 U/L (ref 0–53)
AST: 27 U/L (ref 0–37)
BUN: 9 mg/dL (ref 6–23)
CO2: 27 mEq/L (ref 19–32)
CREATININE: 0.79 mg/dL (ref 0.40–1.50)
Calcium: 9.5 mg/dL (ref 8.4–10.5)
Chloride: 104 mEq/L (ref 96–112)
GFR: 109.34 mL/min (ref >60.00)
Glucose, Bld: 98 mg/dL (ref 70–99)
Potassium: 4 mEq/L (ref 3.5–5.1)
SODIUM: 140 meq/L (ref 135–145)
TOTAL PROTEIN: 7.3 g/dL (ref 6.0–8.3)
Total Bilirubin: 0.5 mg/dL (ref 0.2–1.2)

## 2015-07-26 LAB — CBC WITH DIFFERENTIAL/PLATELET
Basophils Absolute: 0 10*3/uL (ref 0.0–0.1)
Basophils Relative: 0.3 % (ref 0.0–3.0)
EOS ABS: 0.2 10*3/uL (ref 0.0–0.7)
Eosinophils Relative: 2 % (ref 0.0–5.0)
HCT: 43.2 % (ref 39.0–52.0)
HEMOGLOBIN: 13.9 g/dL (ref 13.0–17.0)
Lymphocytes Relative: 21.6 % (ref 12.0–46.0)
Lymphs Abs: 2 10*3/uL (ref 0.7–4.0)
MCHC: 32.2 g/dL (ref 30.0–36.0)
MCV: 80.3 fl (ref 78.0–100.0)
MONO ABS: 0.7 10*3/uL (ref 0.1–1.0)
Monocytes Relative: 7.3 % (ref 3.0–12.0)
Neutro Abs: 6.5 10*3/uL (ref 1.4–7.7)
Neutrophils Relative %: 68.8 % (ref 43.0–77.0)
Platelets: 337 10*3/uL (ref 150.0–400.0)
RBC: 5.38 Mil/uL (ref 4.22–5.81)
RDW: 15 % (ref 11.5–15.5)
WBC: 9.4 10*3/uL (ref 4.0–10.5)

## 2015-07-26 LAB — AMYLASE: AMYLASE: 34 U/L (ref 27–131)

## 2015-07-26 LAB — LIPASE: Lipase: 30 U/L (ref 11.0–59.0)

## 2015-07-26 MED ORDER — ONDANSETRON HCL 4 MG PO TABS: 4.0000 mg | ORAL_TABLET | Freq: Three times a day (TID) | ORAL | Status: DC | PRN

## 2015-07-26 MED ORDER — OMEPRAZOLE 40 MG PO CPDR: 40.0000 mg | DELAYED_RELEASE_CAPSULE | Freq: Every day | ORAL | Status: DC

## 2015-07-26 NOTE — Progress Notes (Signed)
Patient ID: Steve [name], male    DOB: 1960-12-12  Age: 54 y.o. MRN: [id number]    Subjective:  Subjective HPI [name] presents for abd pain x 3-4 days.  + vomiting today x 1.  He has been able to hold down fluids.  No fever/ chills.     Review of Systems  Constitutional: Negative for diaphoresis, appetite change, fatigue and unexpected weight change.  Eyes: Negative for pain, redness and visual disturbance.  Respiratory: Negative for cough, chest tightness, shortness of breath and wheezing.   Cardiovascular: Negative for chest pain, palpitations and leg swelling.  Gastrointestinal: Positive for nausea, vomiting and abdominal pain. Negative for diarrhea, constipation and blood in stool.  Endocrine: Negative for cold intolerance, heat intolerance, polydipsia, polyphagia and polyuria.  Genitourinary: Negative for dysuria, frequency and difficulty urinating.  Neurological: Negative for dizziness, light-headedness, numbness and headaches.    History Past Medical History  Diagnosis Date  . Diabetes mellitus without complication ([place])   . Hypertension   . Hyperlipidemia   . Complication of anesthesia     has night terrors after anesthesia but can be violent when waking up  . 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 Pt stated "I forget alot of things"  . Frequent urination at night     when blood sugar runs high  . Psoriasis of scalp     nose and face; occurs mainly in winter   . Seasonal allergies   . CAD (coronary artery disease)   . Headache(784.0)   . Sinus infection 12/16/13  . ADHD (attention deficit hyperactivity disorder)   . 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   His family history includes Alzheimer's disease in his father; Cancer in his father, maternal grandfather, maternal grandmother,  maternal uncle, maternal uncle, maternal uncle, and mother; Cancer (age of onset: 82) in his cousin; Colon cancer (age of onset: 54) in his maternal uncle; Colon cancer (age of onset: 61) in his maternal uncle; Colon polyps in his paternal uncle; Diabetes in his father and mother; Hypertension in his father and mother. There is no history of Esophageal cancer, Stomach cancer, or Rectal cancer.He reports that he has never smoked. He has never used smokeless tobacco. He reports that he drinks about 0.6 - 1.2 oz of alcohol per week. He reports that he does not use illicit drugs.  Current Outpatient Prescriptions on File Prior to Visit  Medication Sig Dispense Refill  . aspirin EC 325 MG tablet Take 1 tablet (325 mg total) by mouth daily. 30 tablet 0  . atorvastatin (LIPITOR) 10 MG tablet Take 1 tablet (10 mg total) by mouth daily. 30 tablet 2  . fluticasone (FLONASE) 50 MCG/ACT nasal spray Place 2 sprays into both nostrils daily. (Patient taking differently: Place 2 sprays into both nostrils as needed for allergies. ) 16 g 6  . gabapentin (NEURONTIN) 300 MG capsule Take 1 capsule (300 mg total) by mouth 3 (three) times daily. 90 capsule 0  . insulin lispro (HUMALOG KWIKPEN) 100 UNIT/ML KiwkPen Inject 0-12 Units into the skin 3 (three) times daily with meals. sliding scale- check sugar and inject 3 times daily before meals as below: <150- Zero units 150-200 2 units 201-250 4 units 251-300 6 units 301-350 8 units 351-400 10 units >400 12 units and contact us. 15 mL  11  . insulin NPH Human (HUMULIN N,NOVOLIN N) 100 UNIT/ML injection 30 units in the morning and 30-40 units at bedtime. 10 mL 2  . lisinopril (PRINIVIL,ZESTRIL) 5 MG tablet Take 1 tablet (5 mg total) by mouth daily. 30 tablet 0  . metFORMIN (GLUCOPHAGE) 1000 MG tablet TAKE ONE TABLET BY MOUTH TWICE DAILY WITH MEALS 60 tablet 3  . Multiple Vitamins-Minerals (ZINC PO) Take 1 tablet by mouth daily.     . sitaGLIPtin (JANUVIA) 100 MG tablet TAKE 1  TABLET BY MOUTH DAILY. 30 tablet 0   No current facility-administered medications on file prior to visit.     Objective:  Objective Physical Exam  Constitutional: He is oriented to person, place, and time. Vital signs are normal. He appears well-developed and well-nourished. He is sleeping.  HENT:  Head: Normocephalic and atraumatic.  Mouth/Throat: Oropharynx is clear and moist.  Eyes: EOM are normal. Pupils are equal, round, and reactive to light.  Neck: Normal range of motion. Neck supple. No thyromegaly present.  Cardiovascular: Normal rate and regular rhythm.   No murmur heard. Pulmonary/Chest: Effort normal and breath sounds normal. No respiratory distress. He has no wheezes. He has no rales. He exhibits no tenderness.  Abdominal: Soft. Bowel sounds are normal. There is tenderness. There is guarding. There is no rebound. No hernia.    Musculoskeletal: He exhibits no edema or tenderness.  Neurological: He is alert and oriented to person, place, and time.  Skin: Skin is warm and dry.  Psychiatric: He has a normal mood and affect. His behavior is normal. Judgment and thought content normal.  Nursing note and vitals reviewed.  BP 130/82 mmHg  Pulse 118  Temp(Src) 97.8 F (36.6 C) (Oral)  Wt 265 lb 12.8 oz (120.566 kg)  SpO2 95% Wt Readings from Last 3 Encounters:  07/26/15 265 lb 12.8 oz (120.566 kg)  06/13/15 268 lb 6 oz (121.734 kg)  05/23/15 270 lb (122.471 kg)     Lab Results  Component Value Date   WBC 9.4 07/26/2015   HGB 13.9 07/26/2015   HCT 43.2 07/26/2015   PLT 337.0 07/26/2015   GLUCOSE 98 07/26/2015   CHOL 139 03/23/2015   TRIG 504.0* 03/23/2015   HDL 32.60* 03/23/2015   LDLDIRECT 52.0 03/23/2015   LDLCALC 30 04/28/2014   ALT 35 07/26/2015   AST 27 07/26/2015   NA 140 07/26/2015   K 4.0 07/26/2015   CL 104 07/26/2015   CREATININE 0.79 07/26/2015   BUN 9 07/26/2015   CO2 27 07/26/2015   TSH 1.20 03/23/2015   PSA 1.27 04/11/2013   INR 0.95  12/20/2013   HGBA1C 8.4* 07/11/2015   MICROALBUR 4.16* 01/30/2014    [place] Extrem Low Right Ltd  06/14/2015  CLINICAL DATA:  Anterior distal right lower extremity laceration 1 month prior, with soft mass at the site of the laceration refractory to antibiotic therapy. EXAM: ULTRASOUND RIGHT LOWER EXTREMITY LIMITED TECHNIQUE: Ultrasound examination of the lower extremity soft tissues was performed in the area of clinical concern. COMPARISON:  04/25/2015 right tibia/ fibula radiographs. FINDINGS: Targeted ultrasound of the area of symptomatic concern as indicated by the patient in the anterior distal right lower extremity demonstrates a subcutaneous 3.2 x 0.8 x 2.4 cm mass with heterogeneously hypoechoic internal echoes, overlying skin thickening and mild surrounding hyperemia and subcutaneous edema. On the sagittal cine sequence, there is a tiny 2 mm echogenic curvilinear structure within the mass, which could represent a tiny foreign body. IMPRESSION: Nonspecific subcutaneous 3.2  x 0.8 x 2.4 cm mass is identified at the area of symptomatic concern as indicated by the patient in the anterior distal right lower extremity, which demonstrates heterogeneous internal echoes. Given the history of trauma to this location, the most likely etiology is a hematoma. Given the overlying skin thickening and mild surrounding hyperemia, infection cannot be excluded. A tiny 2 mm echogenic structure within the mass could represent a tiny foreign body. If the mass persists, MRI with and without intravenous contrast and/or surgical exploration may be warranted. Electronically Signed   By: Ilona Sorrel M.D.   On: 06/14/2015 08:35     Assessment & Plan:  Plan I am having Mr. Celestin start on omeprazole and ondansetron. I am also having him maintain his Multiple Vitamins-Minerals (ZINC PO), fluticasone, aspirin EC, sitaGLIPtin, metFORMIN, lisinopril, atorvastatin, gabapentin, insulin NPH Human, and insulin lispro.  Meds ordered this  encounter  Medications  . omeprazole (PRILOSEC) 40 MG capsule    Sig: Take 1 capsule (40 mg total) by mouth daily.    Dispense:  30 capsule    Refill:  3  . ondansetron (ZOFRAN) 4 MG tablet    Sig: Take 1 tablet (4 mg total) by mouth every 8 (eight) hours as needed for nausea or vomiting.    Dispense:  20 tablet    Refill:  0    Problem List Items Addressed This Visit    Abdominal pain - Primary    Check [place] abd--- r/o gb Omeprazole Check labs zofran for nausea  go to ER if symptoms worsen If [place] neg -- refer to GI       Relevant Medications   omeprazole (PRILOSEC) 40 MG capsule   Other Relevant Orders   Comp Met (CMET) (Completed)   CBC with Differential/Platelet (Completed)   Amylase (Completed)   Lipase (Completed)   US Abdomen Complete (Completed)    Other Visit Diagnoses    Bilious vomiting with nausea        Relevant Medications    ondansetron (ZOFRAN) 4 MG tablet       Follow-up: Return if symptoms worsen or fail to improve.  Garnet Koyanagi, DO

## 2015-07-26 NOTE — Patient Instructions (Signed)

## 2015-07-26 NOTE — Progress Notes (Signed)
Pre visit review using our clinic review tool, if applicable. No additional management support is needed unless otherwise documented below in the visit note. 

## 2015-07-26 NOTE — Assessment & Plan Note (Signed)
Check [place] abd--- r/o gb Omeprazole Check labs zofran for nausea  go to ER if symptoms worsen If [place] neg -- refer to GI

## 2015-07-27 ENCOUNTER — Other Ambulatory Visit: Payer: Self-pay

## 2015-07-27 DIAGNOSIS — R109 Unspecified abdominal pain: Secondary | ICD-10-CM

## 2015-08-17 ENCOUNTER — Ambulatory Visit (INDEPENDENT_AMBULATORY_CARE_PROVIDER_SITE_OTHER): Payer: 59 | Admitting: Medical

## 2015-08-17 ENCOUNTER — Other Ambulatory Visit: Payer: Self-pay | Admitting: Medical

## 2015-08-17 ENCOUNTER — Encounter: Payer: Self-pay | Admitting: Medical

## 2015-08-17 ENCOUNTER — Ambulatory Visit (HOSPITAL_BASED_OUTPATIENT_CLINIC_OR_DEPARTMENT_OTHER)
Admission: RE | Admit: 2015-08-17 | Discharge: 2015-08-17 | Disposition: A | Discharge status: Home / Self Care | Payer: 59 | Source: Ambulatory Visit | Attending: Medical | Admitting: Medical

## 2015-08-17 VITALS — BP 128/80 | HR 88 | Temp 97.8°F | Ht 76.0 in | Wt 270.0 lb

## 2015-08-17 DIAGNOSIS — M79675 Pain in left toe(s): Secondary | ICD-10-CM

## 2015-08-17 DIAGNOSIS — M25571 Pain in right ankle and joints of right foot: Secondary | ICD-10-CM | POA: Diagnosis not present

## 2015-08-17 DIAGNOSIS — M7989 Other specified soft tissue disorders: Secondary | ICD-10-CM | POA: Insufficient documentation

## 2015-08-17 DIAGNOSIS — M79674 Pain in right toe(s): Secondary | ICD-10-CM | POA: Insufficient documentation

## 2015-08-17 DIAGNOSIS — M25572 Pain in left ankle and joints of left foot: Secondary | ICD-10-CM

## 2015-08-17 MED ORDER — HYDROCODONE-ACETAMINOPHEN 5-325 MG PO TABS: 1.0000 | ORAL_TABLET | Freq: Four times a day (QID) | ORAL | Status: DC | PRN

## 2015-08-17 NOTE — Patient Instructions (Addendum)
For foot and toe pain get xray of foot stat.  Will rx post op shoe and crutches in event there is fracture.  Will refer to ortho depending of if fracture of metatarsal.  Follow up here will be dependant on if fracture of the foot.  norco rx for pain.

## 2015-08-17 NOTE — Progress Notes (Addendum)
Subjective:    Patient ID: Steve [name], male    DOB: 10/06/1961, 54 y.o.   MRN: [id number]  HPI  Pt dropped plywood on his rt foot yesterday. But most of injury occurred to his rt great toe. Toe hurts a lot. The foot top aspect hurts some.  Also 2 weeks ago his rt heal started to hurt. He has hx of plantar fascitis and has seen podiatrist in the past. Pt saw podiatrist 6 yrs ago and they gave injection and orthotics. Pain was resolved up until 2 wks ago when re-occured.   Review of Systems  Constitutional: Negative for fever, chills and fatigue.  Respiratory: Negative for chest tightness, shortness of breath and wheezing.   Cardiovascular: Negative for chest pain and palpitations.  Musculoskeletal:       See hpi  Skin:       Slight bruise to toe.  Hematological: Negative for adenopathy. Does not bruise/bleed easily.    Past Medical History  Diagnosis Date  . Diabetes mellitus without complication ([place])   . Hypertension   . Hyperlipidemia   . Complication of anesthesia     has night terrors after anesthesia but can be violent when waking up  . 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 Pt stated "I forget alot of things"  . Frequent urination at night     when blood sugar runs high  . Psoriasis of scalp     nose and face; occurs mainly in winter   . Seasonal allergies   . CAD (coronary artery disease)   . Headache(784.0)   . Sinus infection 12/16/13  . ADHD (attention deficit hyperactivity disorder)   . Allergy     Social History   Social History  . Marital Status: Married    Spouse Name: N/A  . Number of Children: 6  . Years of Education: N/A   Occupational History  . Not on file.   Social History Main Topics  . Smoking status: Never Smoker   . Smokeless tobacco: Never Used  . Alcohol Use: 0.6 - 1.2 oz/week    1-2 Cans of beer per week     Comment: rare beer  . Drug Use: No  . Sexual Activity:    Partners: Female   Other Topics Concern  .  Not on file   Social History Narrative   Regular exercise: very little   Caffeine use: sweet tea daily   6 children   Works in Architect, owns concession.   Married   Enjoys televition.      Past Surgical History  Procedure Laterality Date  . Hernia repair  2010    abdominal hernia  . Knee arthroscopy  2012    left knee  . Cardiac catheterization      no PCI approx 10 years ago - [place] surgery      right ring finger  . Knee surgery    . Total knee arthroplasty Left 12/28/2013    Procedure: TOTAL KNEE ARTHROPLASTY;  Surgeon: Ninetta Lights, MD;  Location: [facility];  Service: Orthopedics;  Laterality: Left;    Family History  Problem Relation Age of Onset  . Diabetes Mother   . Cancer Mother     history breast cancer  . Hypertension Mother   . Cancer Father     prostate  . Alzheimer's disease Father   . Diabetes Father   . Hypertension Father   .  Cancer Maternal Uncle     colon  . Colon cancer Maternal Uncle 19  . Cancer Maternal Grandmother     breast  . Cancer Maternal Grandfather     prostate  . Cancer Maternal Uncle     prostate  . Colon cancer Maternal Uncle 65  . Cancer Maternal Uncle     prostate  . Cancer Cousin 47    metastatic colon cancer?  . Colon polyps Paternal Uncle   . Esophageal cancer Neg Hx   . Stomach cancer Neg Hx   . Rectal cancer Neg Hx     Allergies  Allergen Reactions  . Sulfa Antibiotics Itching and Other (See Comments)    Burning also    Current Outpatient Prescriptions on File Prior to Visit  Medication Sig Dispense Refill  . aspirin EC 325 MG tablet Take 1 tablet (325 mg total) by mouth daily. 30 tablet 0  . atorvastatin (LIPITOR) 10 MG tablet Take 1 tablet (10 mg total) by mouth daily. 30 tablet 2  . fluticasone (FLONASE) 50 MCG/ACT nasal spray Place 2 sprays into both nostrils daily. (Patient taking differently: Place 2 sprays into both nostrils as needed for allergies. ) 16 g 6  . gabapentin  (NEURONTIN) 300 MG capsule Take 1 capsule (300 mg total) by mouth 3 (three) times daily. 90 capsule 0  . insulin lispro (HUMALOG KWIKPEN) 100 UNIT/ML KiwkPen Inject 0-12 Units into the skin 3 (three) times daily with meals. sliding scale- check sugar and inject 3 times daily before meals as below: <150- Zero units 150-200 2 units 201-250 4 units 251-300 6 units 301-350 8 units 351-400 10 units >400 12 units and contact us. 15 mL 11  . insulin NPH Human (HUMULIN N,NOVOLIN N) 100 UNIT/ML injection 30 units in the morning and 30-40 units at bedtime. 10 mL 2  . lisinopril (PRINIVIL,ZESTRIL) 5 MG tablet Take 1 tablet (5 mg total) by mouth daily. 30 tablet 0  . metFORMIN (GLUCOPHAGE) 1000 MG tablet TAKE ONE TABLET BY MOUTH TWICE DAILY WITH MEALS 60 tablet 3  . Multiple Vitamins-Minerals (ZINC PO) Take 1 tablet by mouth daily.     Marland Kitchen omeprazole (PRILOSEC) 40 MG capsule Take 1 capsule (40 mg total) by mouth daily. 30 capsule 3  . ondansetron (ZOFRAN) 4 MG tablet Take 1 tablet (4 mg total) by mouth every 8 (eight) hours as needed for nausea or vomiting. 20 tablet 0  . sitaGLIPtin (JANUVIA) 100 MG tablet TAKE 1 TABLET BY MOUTH DAILY. 30 tablet 0   No current facility-administered medications on file prior to visit.    BP 128/80 mmHg  Pulse 88  Temp(Src) 97.8 F (36.6 C) (Oral)  Ht 6\' 4"  (1.93 m)  Wt 270 lb (122.471 kg)  BMI 32.88 kg/m2  SpO2 98%       Objective:   Physical Exam  General- No acute distress. Pleasant patient.  Lt foot- top and proximal aspect mild tender but no swollen. Lt great toe- mild swollen bruised and very tender. Tender at base and distal 1st metatarsal area. Heal tenderness as well.      Assessment & Plan:  For foot and toe pain get xray of foot stat.  Will rx post op shoe and crutches in event there is fracture.  Will refer to ortho depending of if fracture of metatarsal.  Follow up here will be dependant on if fracture of the foot.  For foot and toe pain get  xray of foot stat.  Will rx  post op shoe and crutches in event there is fracture.  Will refer to ortho depending of if fracture of metatarsal.  Follow up here will be dependant on if fracture of the foot.

## 2015-08-17 NOTE — Progress Notes (Signed)
Pre visit review using our clinic review tool, if applicable. No additional management support is needed unless otherwise documented below in the visit note. 

## 2015-08-20 ENCOUNTER — Telehealth: Payer: Self-pay

## 2015-08-20 DIAGNOSIS — M25571 Pain in right ankle and joints of right foot: Secondary | ICD-10-CM

## 2015-08-20 NOTE — Telephone Encounter (Signed)
Will you look at the referral for his heel/foot pain. Can he be seen quickly?

## 2015-08-20 NOTE — Telephone Encounter (Signed)
Notified pt of results and pt states that he is having some heel pain. He states that he is not able to put weight on his foot and would like to know what you advise. He wants to know if you would need to put a referral for further work up. Please advise.

## 2015-08-20 NOTE — Telephone Encounter (Signed)
If delay on podiatry referral let me know will try sports medicine.

## 2015-08-21 NOTE — Telephone Encounter (Signed)
Pt has an appt this morning.

## 2015-08-30 ENCOUNTER — Other Ambulatory Visit: Payer: Self-pay | Admitting: Physician Assistant

## 2015-09-11 NOTE — Pre-Procedure Instructions (Signed)
Steve [name]  09/11/2015      [facility] [place] [address] [place] [address] Phone: [phone] Fax: [phone]  [facility] [place] - [id number] [place] ([place]) [place] [address] Phone: [phone] Fax: [phone]    Your procedure is scheduled on 09-19-2015  Wednesday .  Report to [facility] Admitting at 6:30 A.M.   Call this number if you have problems the morning of surgery:  [phone]   Remember:  Do not eat food or drink liquids after midnight.   Take these medicines the morning of surgery with A SIP OF WATER Flonase nasal spray ,gabapentin(neurotin),pain medication if needed,omeprazole(Prilosec)                                               How to Manage Your Diabetes Before Surgery   Why is it important to control my blood sugar before and after surgery?   Improving blood sugar levels before and after surgery helps healing and can limit problems.  A way of improving blood sugar control is eating a healthy diet by:  - Eating less sugar and carbohydrates  - Increasing activity/exercise  - Talk with your doctor about reaching your blood sugar goals  High blood sugars (greater than 180 mg/dL) can raise your risk of infections and slow down your recovery so you will need to focus on controlling your diabetes during the weeks before surgery.  Make sure that the doctor who takes care of your diabetes knows about your planned surgery including the date and location.  How do I manage my blood sugars before surgery?   Check your blood sugar at least 4 times a day, 2 days before surgery to make sure that they are not too high or low.   Check your blood sugar the morning of your surgery when you wake up and every 2               hours until you get to the Short-Stay unit.  If your blood sugar is less than 70  mg/dL, you will need to treat for low blood sugar by:  Treat a low blood sugar (less than 70 mg/dL) with 1/2 cup of clear juice (cranberry or apple), 4 glucose tablets, OR glucose gel.  Recheck blood sugar in 15 minutes after treatment (to make sure it is greater than 70 mg/dL).  If blood sugar is not greater than 70 mg/dL on re-check, call [phone] for further instructions.   Report your blood sugar to the Short-Stay nurse when you get to Short-Stay.  References:  [facility], 2007 "How to Manage your Diabetes Before and After Surgery".  What do I do about my diabetes medications?   Do not take oral diabetes medicines (pills) the morning of surgery.      THE NIGHT BEFORE SURGERY, take 21-28  units of NPH Insulin.    THE MORNING OF SURGERY, take 15 units of NPH Insulin.    Do not take other diabetes injectables the day of surgery including Byetta, Victoza, Bydureon, and Trulicity.    If your CBG is greater than 220 mg/dL, you may take 1/2 of your sliding scale (correction) dose of insulin.   Marland Kitchen   Marland Kitchen  Do not wear jewelry.  Do not wear lotions, powders, or perfumes.  You may not wear deodorant.  Do not shave 48 hours prior to surgery.  Men may shave face and neck.    Do not bring valuables to the hospital.  [facility] is not responsible for any belongings or valuables.  Contacts, dentures or bridgework may not be worn into surgery.  Leave your suitcase in the car.  After surgery it may be brought to your room.  For patients admitted to the hospital, discharge time will be determined by your treatment team.  Patients discharged the day of surgery will not be allowed to drive home.    Special instructions:  See attached Sheet for instructions on CHG showers  Please read over the following fact sheets that you were given. Pain Booklet and Surgical Site Infection Prevention

## 2015-09-12 ENCOUNTER — Encounter (HOSPITAL_COMMUNITY)
Admission: RE | Admit: 2015-09-12 | Discharge: 2015-09-12 | Disposition: A | Discharge status: Home / Self Care | Payer: 59 | Source: Ambulatory Visit | Attending: Otolaryngology | Admitting: Otolaryngology

## 2015-09-12 ENCOUNTER — Encounter (HOSPITAL_COMMUNITY): Payer: Self-pay

## 2015-09-12 DIAGNOSIS — Z01818 Encounter for other preprocedural examination: Secondary | ICD-10-CM | POA: Diagnosis not present

## 2015-09-12 DIAGNOSIS — L409 Psoriasis, unspecified: Secondary | ICD-10-CM | POA: Insufficient documentation

## 2015-09-12 DIAGNOSIS — J343 Hypertrophy of nasal turbinates: Secondary | ICD-10-CM | POA: Diagnosis not present

## 2015-09-12 DIAGNOSIS — Z01812 Encounter for preprocedural laboratory examination: Secondary | ICD-10-CM | POA: Diagnosis not present

## 2015-09-12 DIAGNOSIS — F909 Attention-deficit hyperactivity disorder, unspecified type: Secondary | ICD-10-CM | POA: Diagnosis not present

## 2015-09-12 DIAGNOSIS — J342 Deviated nasal septum: Secondary | ICD-10-CM | POA: Insufficient documentation

## 2015-09-12 DIAGNOSIS — I2510 Atherosclerotic heart disease of native coronary artery without angina pectoris: Secondary | ICD-10-CM | POA: Diagnosis not present

## 2015-09-12 DIAGNOSIS — Z794 Long term (current) use of insulin: Secondary | ICD-10-CM | POA: Diagnosis not present

## 2015-09-12 DIAGNOSIS — I10 Essential (primary) hypertension: Secondary | ICD-10-CM | POA: Insufficient documentation

## 2015-09-12 DIAGNOSIS — Z79899 Other long term (current) drug therapy: Secondary | ICD-10-CM | POA: Insufficient documentation

## 2015-09-12 HISTORY — DX: Major depressive disorder, single episode, unspecified: F32.9

## 2015-09-12 HISTORY — DX: Depression, unspecified: F32.A

## 2015-09-12 HISTORY — DX: Anxiety disorder, unspecified: F41.9

## 2015-09-12 LAB — CBC
HCT: 39.5 % (ref 39.0–52.0)
Hemoglobin: 13.2 g/dL (ref 13.0–17.0)
MCH: 26.3 pg (ref 26.0–34.0)
MCHC: 33.4 g/dL (ref 30.0–36.0)
MCV: 78.7 fL (ref 78.0–100.0)
Platelets: 279 10*3/uL (ref 150–400)
RBC: 5.02 MIL/uL (ref 4.22–5.81)
RDW: 14.5 % (ref 11.5–15.5)
WBC: 6.2 10*3/uL (ref 4.0–10.5)

## 2015-09-12 LAB — BASIC METABOLIC PANEL
Anion gap: 11 (ref 5–15)
BUN: 9 mg/dL (ref 6–20)
CALCIUM: 9 mg/dL (ref 8.9–10.3)
CO2: 23 mmol/L (ref 22–32)
CREATININE: 0.72 mg/dL (ref 0.61–1.24)
Chloride: 105 mmol/L (ref 101–111)
GFR calc Af Amer: >60 mL/min (ref >60)
GLUCOSE: 128 mg/dL — AB (ref 65–99)
Potassium: 4.2 mmol/L (ref 3.5–5.1)
Sodium: 139 mmol/L (ref 135–145)

## 2015-09-12 LAB — GLUCOSE, CAPILLARY: GLUCOSE-CAPILLARY: 166 mg/dL — AB (ref 65–99)

## 2015-09-12 NOTE — Progress Notes (Signed)
This pt. Has scored at an elevated risk for obstructive sleep apnea using the STOP BANG TOOL during a pre-surgical visit. A Score of 5 or greater is considered an elevated risk.

## 2015-09-12 NOTE — Progress Notes (Signed)
Spoke with Steve [name] in Dr. Noreene Filbert office concerning orders. Pt has appointment with him [id number] and Dr. Erik Obey will place orders after seeing patient.

## 2015-09-13 LAB — HEMOGLOBIN A1C
HEMOGLOBIN A1C: 9.8 % — AB (ref 4.8–5.6)
Mean Plasma Glucose: 235 mg/dL

## 2015-09-17 ENCOUNTER — Other Ambulatory Visit: Payer: Self-pay | Admitting: Otolaryngology

## 2015-09-17 NOTE — H&P (Signed)
Steve [name], Steve [name] 54 y.o., male [id number]     Chief Complaint: nasal obstruction  HPI: 54 year old white male comes in for evaluation of chronic recurrent sinus problems.  He claims he has had issues 6 or 7 years or longer.  Right now, he is having yellow drainage and facial pressure.  He has been on Flonase for one month.  He uses Allegra-D every single day.  He often awakens with a sore dry throat.  He does not smoke.  Sometimes she will have earaches and muffled hearing.  No history of polyps, asthma, or prior nasal surgery.  No sinus imaging thus far.   He is scheduled for a knee replacement later this month.  I talked with one of the PAs and Dr. Debroah Loop office who does not feel that the chronic sinus infection is a contraindication to joint replacement as long as we have him under treatment.  2 months recheck.  He had his LEFT knee replaced and is making decent steady progress.  His postoperative course was complicated by shingles.  We gave him a one month course of Augmentin last time we saw him.  He feels like maybe this helped slightly.  He has maintained on Flonase but stopped Allegra.  No facial pain and or pressure.  No nasal drainage.  His nose is still very congested with poor breathing.  This is basically a year round problem for him.   On specific questioning, he snores somewhat but does not think he has sleep apnea.  I asked him to clarify this matter with his wife.  18 months return visit.  His nose still breathes poorly essentially all of the time.  He does use Flonase chronically with perhaps slight improvement.  He did have his LEFT knee replacement, and promptly afterwards had shingles.  He has had a cuff go between pain and swelling and is still not fully functional.  He is aware that he snores, but does not feel that he has sleep apnea.  His wife is not here to comment.   I discussed the proposed surgery namely septoplasty and reduction of turbinates in  basic detail so he can  make a decision and start making plans.  I would like to see him back preoperatively.  Preoperative visit.  We are planning septoplasty and reduction of turbinates.  I discussed the surgery in detail including risks and complications.  Questions were answered and informed consent was obtained.  I discussed advancement of diet and activity, including return to work.  I gave him prescriptions for hydrocodone and Keflex as well as nasal hygiene instructions.  PMH: Past Medical History  Diagnosis Date  . Diabetes mellitus without complication ([place])   . Hypertension   . Hyperlipidemia   . Complication of anesthesia     has night terrors after anesthesia but can be violent when waking up  . 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 Pt stated "I forget alot of things"  . Frequent urination at night     when blood sugar runs high  . Psoriasis of scalp     nose and face; occurs mainly in winter   . Seasonal allergies   . CAD (coronary artery disease)   . Sinus infection 12/16/13  . ADHD (attention deficit hyperactivity disorder)   . Allergy   . Depression   . Anxiety     Surg Hx: Past Surgical History  Procedure Laterality Date  . Hernia repair  2010  abdominal hernia  . Knee arthroscopy  2012    left knee  . Cardiac catheterization      no PCI approx 10 years ago - [place] surgery      right ring finger  . Knee surgery    . Total knee arthroplasty Left 12/28/2013    Procedure: TOTAL KNEE ARTHROPLASTY;  Surgeon: Ninetta Lights, MD;  Location: [facility];  Service: Orthopedics;  Laterality: Left;    FHx:   Family History  Problem Relation Age of Onset  . Diabetes Mother   . Cancer Mother     history breast cancer  . Hypertension Mother   . Cancer Father     prostate  . Alzheimer's disease Father   . Diabetes Father   . Hypertension Father   . Cancer Maternal Uncle     colon  . Colon cancer Maternal Uncle 59  . Cancer Maternal Grandmother      breast  . Cancer Maternal Grandfather     prostate  . Cancer Maternal Uncle     prostate  . Colon cancer Maternal Uncle 78  . Cancer Maternal Uncle     prostate  . Cancer Cousin 47    metastatic colon cancer?  . Colon polyps Paternal Uncle   . Esophageal cancer Neg Hx   . Stomach cancer Neg Hx   . Rectal cancer Neg Hx    SocHx:  reports that he has never smoked. He has never used smokeless tobacco. He reports that he drinks about 0.6 - 1.2 oz of alcohol per week. He reports that he does not use illicit drugs.  ALLERGIES:  Allergies  Allergen Reactions  . Sulfa Antibiotics Itching and Other (See Comments)    Burning also     (Not in a hospital admission)  No results found for this or any previous visit (from the past 48 hour(s)). No results found.  [id number]: Feeling tired (fatigue).  No fever, no night sweats, and no recent weight loss. Head: Headache. Eyes: Eye symptoms. Otolaryngeal: No hearing loss.  Earache.  No tinnitus.  Purulent nasal discharge  and nasal passage blockage (stuffiness).  No snoring.  Sneezing.  No hoarseness.  Sore throat. Cardiovascular: No chest pain or discomfort  and no palpitations. Pulmonary: No dyspnea.  Cough.  No wheezing. Gastrointestinal: No dysphagia  and no heartburn.  No nausea, no abdominal pain, and no melena.  No diarrhea. Genitourinary: No dysuria. Endocrine: No muscle weakness. Musculoskeletal: No calf muscle cramps, no arthralgias, and no soft tissue swelling. Neurological: No dizziness, no fainting, no tingling, and no numbness. Psychological: No anxiety  and no depression. Skin: No rash   BP:115/77,  HR: 78 b/min,  Height: 6 ft 4 in, Weight: 260 lb , BMI: 31.6 kg/m2,    PHYSICAL EXAM: He is muscular.  He is breathing through mouth and nose.  He has a fairly severe rightward septal deviation and bulky turbinates.  Oral cavity shows slight redundancy of the soft palate.  Neck is muscular.   Lungs: Clear to  auscultation Heart: Regular rate and rhythm Abdomen: Soft, active Extremities: Normal configuration Neurologic: symmetric, grossly intact.  Studies Reviewed:Noncontrast CT scan of the paranasal sinuses with images in 3 orthogonal planes shows a slight leftward septal deviation with a prominent chondroethmoid spur.  No active sinus disease anywhere.  Osteomeatal complex is narrow but patent on both sides.    Assessment/Plan Deviated nasal septum (470) (J34.2). Hypertrophy of nasal turbinates (478.0) (J34.3).  We  are going to straighten your nasal septum and reduce the turbinates.  This should make you breathe better both day and night.  Because of possible sleep apnea, we will observe you one night in the hospital then home the following morning.  I will remove your nasal packs before discharge.  You may begin nasal hygiene measures from that point forward.  No strenuous activities for 2 weeks after surgery.  Cephalexin 500 MG Oral Capsule;TAKE 1 CAPSULE 4 TIMES DAILY; Qty40; R0; Rx. Hydrocodone-Acetaminophen 5-325 MG Oral Tablet;1-2 po q4-6h prn pain; 123XX123; R0; Rx.    Erik Obey, Zeniyah Peaster [id number], 9:00 PM

## 2015-09-17 NOTE — Progress Notes (Signed)
Anesthesia Chart Review: Patient is a 54 year old male scheduled for nasal septoplasty with turbinate reduction on [id number] by Dr. Erik [name].  History includes non-smoker, DM2, HTN, CAD (he said cath > 10 years ago showed "very little" CAD), psoriasis, memory loss, ADHD, depression, anxiety, left TKA '15. Has had night terrors after anesthesia and awaken from anesthesia "violent." He had an elevated preoperative OSA screening score. PCP is listed as Steve Loft, PA-C with [name]. He was evaluated by cardiologist Dr. Debara [name] in 08/2013 for preoperative clearance (before TKA) and also following ED evaluation for chest pain with tachycardia. D-dimer and troponin were negative.   Meds include ASA (on hold), Lipitor, Flonase, Neurontin, Norco, Humalog, Humulin NPH, Januvia, metformin, lisinopril, Prilosec.  09/12/15 EKG: NSR. He denied chest pain, SOB. Says he can walk up two flights of stairs without CV symptoms. He still has left knee pain since his TKA, so activity is somewhat limited. He oversees constructions sites.   07/29/13 Echo: Study Conclusions Left ventricle: The cavity size was normal. Wall thickness was increased in a pattern of mild LVH. Systolic function was normal. The estimated ejection fraction was in the range of 55% to 60%. Doppler parameters are consistent with abnormal left ventricular relaxation (grade 1 diastolic dysfunction).   08/10/13 Nuclear stress test: Overall Impression: Low risk stress nuclear study. No reversible ischemia. There is mild apical attenuation on both rest and stress images. Gated imaging shows mild global hypokinesis. LV Ejection Fraction: 39%. LV Wall Motion: Mild global hypokinesis. Visually the LV systolic function appears better than 39%. No segmental wall motion abnormalities. (Per Dr. Lysbeth [name] 08/26/13 note, "there was a gating abnormality likely to explain the decreased EF." EF was 55-60% by 07/29/13 echo.)  Reported a cardiac  cath > 10 years ago that did not require intervention. He thought he was done at [facility]--no report seen.   09/12/15 labs noted. Glucose 128. Cr, CBC WNL. A1C 09/12/15 was 9.8, up from 8.4 on 07/11/15. He report that until ~ 3 weeks ago, fasting glucose levels were running ~ 120-140's. Now running ~ 190-210. He has been taking NPH 40 Units BID, but admits to usually forgetting to take his Novolog meal coverage. He is also on DM pills. Denied recent steroids or known illness.   We discuss that if he arrives with a fasting glucose much over 200 that could postpone or cancel his surgery. Encouraged continued out-patient follow-up to improved long term glucose control. Also reviewed above including prior cardiac studies with anesthesiologist Dr. Tobias [name]. If patient remains asymptomatic from a CV standpoint and glucose is acceptable on arrival then it is anticipated that he can proceed as planned.  [name] [facility] Short Stay Center/Anesthesiology Phone [phone] 09/17/2015 12:12 PM

## 2015-09-18 MED ORDER — DEXTROSE 5 % IV SOLN
3.0000 g | INTRAVENOUS | Status: AC
  Administered 2015-09-19: 3 g via INTRAVENOUS
  Filled 2015-09-18 (×2): qty 3000

## 2015-09-19 ENCOUNTER — Ambulatory Visit (HOSPITAL_COMMUNITY): Payer: 59 | Admitting: Vascular Surgery

## 2015-09-19 ENCOUNTER — Encounter (HOSPITAL_COMMUNITY): Payer: Self-pay | Admitting: Certified Registered Nurse Anesthetist

## 2015-09-19 ENCOUNTER — Observation Stay (HOSPITAL_COMMUNITY)
Admission: RE | Admit: 2015-09-19 | Discharge: 2015-09-20 | Disposition: A | Discharge status: Home / Self Care | Payer: 59 | Source: Ambulatory Visit | Attending: Otolaryngology | Admitting: Otolaryngology

## 2015-09-19 ENCOUNTER — Encounter (HOSPITAL_COMMUNITY)
Admission: RE | Disposition: A | Discharge status: Home / Self Care | Payer: Self-pay | Source: Ambulatory Visit | Attending: Otolaryngology

## 2015-09-19 ENCOUNTER — Ambulatory Visit (HOSPITAL_COMMUNITY): Payer: 59 | Admitting: Certified Registered Nurse Anesthetist

## 2015-09-19 DIAGNOSIS — J343 Hypertrophy of nasal turbinates: Secondary | ICD-10-CM | POA: Diagnosis not present

## 2015-09-19 DIAGNOSIS — E785 Hyperlipidemia, unspecified: Secondary | ICD-10-CM | POA: Diagnosis not present

## 2015-09-19 DIAGNOSIS — M1990 Unspecified osteoarthritis, unspecified site: Secondary | ICD-10-CM | POA: Insufficient documentation

## 2015-09-19 DIAGNOSIS — I10 Essential (primary) hypertension: Secondary | ICD-10-CM | POA: Insufficient documentation

## 2015-09-19 DIAGNOSIS — Z882 Allergy status to sulfonamides status: Secondary | ICD-10-CM | POA: Insufficient documentation

## 2015-09-19 DIAGNOSIS — K219 Gastro-esophageal reflux disease without esophagitis: Secondary | ICD-10-CM | POA: Diagnosis not present

## 2015-09-19 DIAGNOSIS — R350 Frequency of micturition: Secondary | ICD-10-CM | POA: Insufficient documentation

## 2015-09-19 DIAGNOSIS — G4733 Obstructive sleep apnea (adult) (pediatric): Secondary | ICD-10-CM | POA: Diagnosis not present

## 2015-09-19 DIAGNOSIS — F419 Anxiety disorder, unspecified: Secondary | ICD-10-CM | POA: Diagnosis not present

## 2015-09-19 DIAGNOSIS — E119 Type 2 diabetes mellitus without complications: Secondary | ICD-10-CM | POA: Diagnosis not present

## 2015-09-19 DIAGNOSIS — F329 Major depressive disorder, single episode, unspecified: Secondary | ICD-10-CM | POA: Insufficient documentation

## 2015-09-19 DIAGNOSIS — Z6832 Body mass index (BMI) 32.0-32.9, adult: Secondary | ICD-10-CM | POA: Insufficient documentation

## 2015-09-19 DIAGNOSIS — L408 Other psoriasis: Secondary | ICD-10-CM | POA: Insufficient documentation

## 2015-09-19 DIAGNOSIS — J342 Deviated nasal septum: Secondary | ICD-10-CM | POA: Diagnosis present

## 2015-09-19 DIAGNOSIS — I2510 Atherosclerotic heart disease of native coronary artery without angina pectoris: Secondary | ICD-10-CM | POA: Diagnosis not present

## 2015-09-19 HISTORY — PX: NASAL SEPTOPLASTY W/ TURBINOPLASTY: SHX2070

## 2015-09-19 LAB — GLUCOSE, CAPILLARY
GLUCOSE-CAPILLARY: 100 mg/dL — AB (ref 65–99)
GLUCOSE-CAPILLARY: 120 mg/dL — AB (ref 65–99)
GLUCOSE-CAPILLARY: 166 mg/dL — AB (ref 65–99)
GLUCOSE-CAPILLARY: 178 mg/dL — AB (ref 65–99)
GLUCOSE-CAPILLARY: 190 mg/dL — AB (ref 65–99)

## 2015-09-19 SURGERY — SEPTOPLASTY, NOSE, WITH NASAL TURBINATE REDUCTION
Anesthesia: General | Site: Nose

## 2015-09-19 MED ORDER — FENTANYL CITRATE (PF) 250 MCG/5ML IJ SOLN
INTRAMUSCULAR | Status: AC
  Filled 2015-09-19: qty 5

## 2015-09-19 MED ORDER — LIDOCAINE-EPINEPHRINE 1 %-1:100000 IJ SOLN
INTRAMUSCULAR | Status: DC | PRN
  Administered 2015-09-19: 15 mL

## 2015-09-19 MED ORDER — ONDANSETRON HCL 4 MG/2ML IJ SOLN
INTRAMUSCULAR | Status: DC | PRN
  Administered 2015-09-19: 4 mg via INTRAVENOUS

## 2015-09-19 MED ORDER — ONDANSETRON HCL 4 MG/2ML IJ SOLN
INTRAMUSCULAR | Status: AC
  Filled 2015-09-19: qty 2

## 2015-09-19 MED ORDER — ROCURONIUM BROMIDE 100 MG/10ML IV SOLN
INTRAVENOUS | Status: DC | PRN
  Administered 2015-09-19: 50 mg via INTRAVENOUS

## 2015-09-19 MED ORDER — OXYMETAZOLINE HCL 0.05 % NA SOLN
NASAL | Status: AC
  Filled 2015-09-19: qty 15

## 2015-09-19 MED ORDER — DEXMEDETOMIDINE HCL IN NACL 200 MCG/50ML IV SOLN
INTRAVENOUS | Status: DC | PRN
  Administered 2015-09-19: .5 ug/kg/h via INTRAVENOUS

## 2015-09-19 MED ORDER — SODIUM CHLORIDE 0.45 % IV SOLN
INTRAVENOUS | Status: DC
Start: 2015-09-19 — End: 2015-09-20
  Administered 2015-09-19: 18:00:00 via INTRAVENOUS

## 2015-09-19 MED ORDER — MORPHINE SULFATE (PF) 2 MG/ML IV SOLN
1.0000 mg | INTRAVENOUS | Status: DC | PRN
  Administered 2015-09-19: 2 mg via INTRAVENOUS
  Filled 2015-09-19: qty 1

## 2015-09-19 MED ORDER — OXYCODONE HCL 5 MG PO TABS
5.0000 mg | ORAL_TABLET | Freq: Once | ORAL | Status: AC | PRN
  Administered 2015-09-19: 5 mg via ORAL

## 2015-09-19 MED ORDER — INSULIN NPH (HUMAN) (ISOPHANE) 100 UNIT/ML ~~LOC~~ SUSP
30.0000 [IU] | Freq: Two times a day (BID) | SUBCUTANEOUS | Status: DC
  Administered 2015-09-19 – 2015-09-20 (×2): 30 [IU] via SUBCUTANEOUS
  Filled 2015-09-19: qty 10

## 2015-09-19 MED ORDER — PROPOFOL 10 MG/ML IV BOLUS
INTRAVENOUS | Status: DC | PRN
  Administered 2015-09-19: 40 mg via INTRAVENOUS
  Administered 2015-09-19: 200 mg via INTRAVENOUS

## 2015-09-19 MED ORDER — HYDROMORPHONE HCL 1 MG/ML IJ SOLN
INTRAMUSCULAR | Status: AC
  Administered 2015-09-19: 0.5 mg via INTRAVENOUS
  Filled 2015-09-19: qty 1

## 2015-09-19 MED ORDER — SUCCINYLCHOLINE CHLORIDE 20 MG/ML IJ SOLN
INTRAMUSCULAR | Status: AC
  Filled 2015-09-19: qty 1

## 2015-09-19 MED ORDER — GLYCOPYRROLATE 0.2 MG/ML IJ SOLN
INTRAMUSCULAR | Status: AC
  Filled 2015-09-19: qty 3

## 2015-09-19 MED ORDER — ONDANSETRON HCL 4 MG PO TABS: 4.0000 mg | ORAL_TABLET | ORAL | Status: DC | PRN

## 2015-09-19 MED ORDER — OXYCODONE HCL 5 MG/5ML PO SOLN: 5.0000 mg | Freq: Once | ORAL | Status: AC | PRN

## 2015-09-19 MED ORDER — INSULIN ASPART 100 UNIT/ML ~~LOC~~ SOLN
12.0000 [IU] | Freq: Three times a day (TID) | SUBCUTANEOUS | Status: DC
  Administered 2015-09-19 – 2015-09-20 (×2): 12 [IU] via SUBCUTANEOUS

## 2015-09-19 MED ORDER — PANTOPRAZOLE SODIUM 40 MG PO TBEC
40.0000 mg | DELAYED_RELEASE_TABLET | Freq: Every day | ORAL | Status: DC
Start: 2015-09-19 — End: 2015-09-20
  Administered 2015-09-19 – 2015-09-20 (×2): 40 mg via ORAL
  Filled 2015-09-19 (×2): qty 1

## 2015-09-19 MED ORDER — HYDROCODONE-ACETAMINOPHEN 5-325 MG PO TABS
ORAL_TABLET | ORAL | Status: AC
  Filled 2015-09-19: qty 2

## 2015-09-19 MED ORDER — ACETAMINOPHEN 160 MG/5ML PO SOLN
325.0000 mg | ORAL | Status: DC | PRN
  Filled 2015-09-19: qty 20.3

## 2015-09-19 MED ORDER — ACETAMINOPHEN 325 MG PO TABS: 325.0000 mg | ORAL_TABLET | ORAL | Status: DC | PRN

## 2015-09-19 MED ORDER — 0.9 % SODIUM CHLORIDE (POUR BTL) OPTIME
TOPICAL | Status: DC | PRN
  Administered 2015-09-19: 1000 mL

## 2015-09-19 MED ORDER — HYDROMORPHONE HCL 1 MG/ML IJ SOLN
0.2500 mg | INTRAMUSCULAR | Status: DC | PRN
  Administered 2015-09-19 (×4): 0.5 mg via INTRAVENOUS

## 2015-09-19 MED ORDER — STERILE WATER FOR INJECTION IJ SOLN
INTRAMUSCULAR | Status: AC
  Filled 2015-09-19: qty 10

## 2015-09-19 MED ORDER — MIDAZOLAM HCL 2 MG/2ML IJ SOLN
INTRAMUSCULAR | Status: AC
  Filled 2015-09-19: qty 2

## 2015-09-19 MED ORDER — NEOSTIGMINE METHYLSULFATE 10 MG/10ML IV SOLN
INTRAVENOUS | Status: AC
  Filled 2015-09-19: qty 1

## 2015-09-19 MED ORDER — OXYMETAZOLINE HCL 0.05 % NA SOLN
NASAL | Status: DC | PRN
  Administered 2015-09-19: 1

## 2015-09-19 MED ORDER — OXYMETAZOLINE HCL 0.05 % NA SOLN
2.0000 | Freq: Two times a day (BID) | NASAL | Status: AC | PRN
  Administered 2015-09-19 (×2): 2 via NASAL

## 2015-09-19 MED ORDER — ONDANSETRON HCL 4 MG/2ML IJ SOLN: 4.0000 mg | INTRAMUSCULAR | Status: DC | PRN

## 2015-09-19 MED ORDER — BACITRACIN ZINC 500 UNIT/GM EX OINT
TOPICAL_OINTMENT | CUTANEOUS | Status: DC | PRN
  Administered 2015-09-19: 1 via TOPICAL

## 2015-09-19 MED ORDER — LINAGLIPTIN 5 MG PO TABS
5.0000 mg | ORAL_TABLET | Freq: Every day | ORAL | Status: DC
Start: 2015-09-19 — End: 2015-09-20
  Administered 2015-09-19 – 2015-09-20 (×2): 5 mg via ORAL
  Filled 2015-09-19 (×2): qty 1

## 2015-09-19 MED ORDER — NEOSTIGMINE METHYLSULFATE 10 MG/10ML IV SOLN
INTRAVENOUS | Status: AC
  Filled 2015-09-19: qty 5

## 2015-09-19 MED ORDER — METFORMIN HCL 500 MG PO TABS
1000.0000 mg | ORAL_TABLET | Freq: Two times a day (BID) | ORAL | Status: DC
  Administered 2015-09-19 – 2015-09-20 (×2): 1000 mg via ORAL
  Filled 2015-09-19 (×2): qty 2

## 2015-09-19 MED ORDER — INSULIN LISPRO 100 UNIT/ML (KWIKPEN): 12.0000 [IU] | PEN_INJECTOR | Freq: Three times a day (TID) | SUBCUTANEOUS | Status: DC

## 2015-09-19 MED ORDER — PROPOFOL 10 MG/ML IV BOLUS
INTRAVENOUS | Status: AC
  Filled 2015-09-19: qty 20

## 2015-09-19 MED ORDER — OXYCODONE-ACETAMINOPHEN 5-325 MG PO TABS: 2.0000 | ORAL_TABLET | Freq: Once | ORAL | Status: DC

## 2015-09-19 MED ORDER — GABAPENTIN 300 MG PO CAPS
300.0000 mg | ORAL_CAPSULE | Freq: Three times a day (TID) | ORAL | Status: DC
  Administered 2015-09-19 – 2015-09-20 (×3): 300 mg via ORAL
  Filled 2015-09-19 (×3): qty 1

## 2015-09-19 MED ORDER — GLYCOPYRROLATE 0.2 MG/ML IJ SOLN
INTRAMUSCULAR | Status: DC | PRN
  Administered 2015-09-19: 0.4 mg via INTRAVENOUS

## 2015-09-19 MED ORDER — NEOSTIGMINE METHYLSULFATE 10 MG/10ML IV SOLN
INTRAVENOUS | Status: DC | PRN
  Administered 2015-09-19: 3 mg via INTRAVENOUS

## 2015-09-19 MED ORDER — EPHEDRINE SULFATE 50 MG/ML IJ SOLN
INTRAMUSCULAR | Status: AC
  Filled 2015-09-19: qty 1

## 2015-09-19 MED ORDER — LIDOCAINE HCL (CARDIAC) 20 MG/ML IV SOLN
INTRAVENOUS | Status: DC | PRN
  Administered 2015-09-19: 70 mg via INTRAVENOUS

## 2015-09-19 MED ORDER — HYDROCODONE-ACETAMINOPHEN 5-325 MG PO TABS: 1.0000 | ORAL_TABLET | Freq: Four times a day (QID) | ORAL | Status: DC | PRN

## 2015-09-19 MED ORDER — LACTATED RINGERS IV SOLN
INTRAVENOUS | Status: DC | PRN
  Administered 2015-09-19: 08:00:00 via INTRAVENOUS

## 2015-09-19 MED ORDER — FENTANYL CITRATE (PF) 100 MCG/2ML IJ SOLN
INTRAMUSCULAR | Status: DC | PRN
  Administered 2015-09-19: 150 ug via INTRAVENOUS
  Administered 2015-09-19 (×2): 50 ug via INTRAVENOUS

## 2015-09-19 MED ORDER — ROCURONIUM BROMIDE 50 MG/5ML IV SOLN
INTRAVENOUS | Status: AC
  Filled 2015-09-19: qty 1

## 2015-09-19 MED ORDER — CEPHALEXIN 250 MG/5ML PO SUSR
500.0000 mg | Freq: Four times a day (QID) | ORAL | Status: DC
Start: 2015-09-19 — End: 2015-09-20
  Administered 2015-09-19 – 2015-09-20 (×2): 500 mg via ORAL
  Filled 2015-09-19 (×7): qty 10

## 2015-09-19 MED ORDER — OXYCODONE HCL 5 MG PO TABS
ORAL_TABLET | ORAL | Status: AC
  Filled 2015-09-19: qty 1

## 2015-09-19 MED ORDER — INSULIN ASPART 100 UNIT/ML ~~LOC~~ SOLN
0.0000 [IU] | Freq: Three times a day (TID) | SUBCUTANEOUS | Status: DC
  Administered 2015-09-19: 3 [IU] via SUBCUTANEOUS
  Administered 2015-09-20: 2 [IU] via SUBCUTANEOUS

## 2015-09-19 MED ORDER — LISINOPRIL 5 MG PO TABS
5.0000 mg | ORAL_TABLET | Freq: Every day | ORAL | Status: DC
  Administered 2015-09-19 – 2015-09-20 (×2): 5 mg via ORAL
  Filled 2015-09-19 (×2): qty 1

## 2015-09-19 MED ORDER — HYDROCODONE-ACETAMINOPHEN 5-325 MG PO TABS
1.0000 | ORAL_TABLET | ORAL | Status: DC | PRN
  Administered 2015-09-19: 1 via ORAL
  Administered 2015-09-19 – 2015-09-20 (×4): 2 via ORAL
  Filled 2015-09-19: qty 2
  Filled 2015-09-19: qty 1
  Filled 2015-09-19 (×2): qty 2

## 2015-09-19 MED ORDER — DEXMEDETOMIDINE HCL IN NACL 200 MCG/50ML IV SOLN
INTRAVENOUS | Status: AC
  Filled 2015-09-19: qty 50

## 2015-09-19 MED ORDER — LIDOCAINE-EPINEPHRINE 1 %-1:100000 IJ SOLN
INTRAMUSCULAR | Status: AC
  Filled 2015-09-19: qty 1

## 2015-09-19 MED ORDER — IBUPROFEN 100 MG/5ML PO SUSP
400.0000 mg | Freq: Four times a day (QID) | ORAL | Status: DC | PRN
  Filled 2015-09-19: qty 20

## 2015-09-19 MED ORDER — BACITRACIN ZINC 500 UNIT/GM EX OINT
TOPICAL_OINTMENT | CUTANEOUS | Status: AC
  Filled 2015-09-19: qty 28.35

## 2015-09-19 MED ORDER — MIDAZOLAM HCL 5 MG/5ML IJ SOLN
INTRAMUSCULAR | Status: DC | PRN
  Administered 2015-09-19: 2 mg via INTRAVENOUS

## 2015-09-19 SURGICAL SUPPLY — 43 items
ATTRACTOMAT 16X20 MAGNETIC DRP (DRAPES) ×2 IMPLANT
BLADE INF TURB ROT M4 2 5PK (BLADE) IMPLANT
BLADE SURG 15 STRL LF DISP TIS (BLADE) IMPLANT
BLADE SURG 15 STRL SS (BLADE)
BLADE TRICUT ROTATE M4 4 5PK (BLADE) IMPLANT
CANISTER SUCTION 2500CC (MISCELLANEOUS) ×2 IMPLANT
COAGULATOR SUCT SWTCH 10FR 6 (ELECTROSURGICAL) ×2 IMPLANT
CRADLE DONUT ADULT HEAD (MISCELLANEOUS) IMPLANT
DRAPE PROXIMA HALF (DRAPES) IMPLANT
DRESSING TELFA 8X10 (GAUZE/BANDAGES/DRESSINGS) IMPLANT
DRSG NASOPORE 8CM (GAUZE/BANDAGES/DRESSINGS) IMPLANT
ELECT REM PT RETURN 9FT ADLT (ELECTROSURGICAL) ×2
ELECTRODE REM PT RTRN 9FT ADLT (ELECTROSURGICAL) ×1 IMPLANT
FILTER ARTHROSCOPY CONVERTOR (FILTER) IMPLANT
GAUZE PACKING FOLDED 2  STR (GAUZE/BANDAGES/DRESSINGS) ×1
GAUZE PACKING FOLDED 2 STR (GAUZE/BANDAGES/DRESSINGS) ×1 IMPLANT
GAUZE SPONGE 2X2 8PLY STRL LF (GAUZE/BANDAGES/DRESSINGS) IMPLANT
GEL ULTRASOUND 20GR AQUASONIC (MISCELLANEOUS) ×2 IMPLANT
GLOVE BIOGEL PI IND STRL 7.0 (GLOVE) ×2 IMPLANT
GLOVE BIOGEL PI INDICATOR 7.0 (GLOVE) ×2
GLOVE ECLIPSE 8.0 STRL XLNG CF (GLOVE) ×4 IMPLANT
GLOVE SURG SS PI 7.0 STRL IVOR (GLOVE) ×4 IMPLANT
GOWN STRL REUS W/ TWL LRG LVL3 (GOWN DISPOSABLE) ×2 IMPLANT
GOWN STRL REUS W/ TWL XL LVL3 (GOWN DISPOSABLE) ×1 IMPLANT
GOWN STRL REUS W/TWL LRG LVL3 (GOWN DISPOSABLE) ×2
GOWN STRL REUS W/TWL XL LVL3 (GOWN DISPOSABLE) ×1
KIT BASIN OR (CUSTOM PROCEDURE TRAY) ×2 IMPLANT
KIT ROOM TURNOVER OR (KITS) ×2 IMPLANT
NEEDLE HYPO 25GX1X1/2 BEV (NEEDLE) IMPLANT
NEEDLE SPNL 25GX3.5 QUINCKE BL (NEEDLE) ×2 IMPLANT
NS IRRIG 1000ML POUR BTL (IV SOLUTION) ×2 IMPLANT
PAD ARMBOARD 7.5X6 YLW CONV (MISCELLANEOUS) ×4 IMPLANT
PATTIES SURGICAL .5 X3 (DISPOSABLE) ×2 IMPLANT
SHEET SIL 040 (INSTRUMENTS) ×2 IMPLANT
SPECIMEN JAR SMALL (MISCELLANEOUS) IMPLANT
SPONGE GAUZE 2X2 STER 10/PKG (GAUZE/BANDAGES/DRESSINGS)
SUT CHROMIC 4 0 P 3 18 (SUTURE) ×2 IMPLANT
SUT ETHILON 3 0 PS 1 (SUTURE) ×2 IMPLANT
SUT PDS AB 4-0 P3 18 (SUTURE) ×2 IMPLANT
SUT PLAIN 4 0 ~~LOC~~ 1 (SUTURE) IMPLANT
TRAY ENT MC OR (CUSTOM PROCEDURE TRAY) ×2 IMPLANT
TUBE CONNECTING 12X1/4 (SUCTIONS) ×2 IMPLANT
WATER STERILE IRR 1000ML POUR (IV SOLUTION) ×2 IMPLANT

## 2015-09-19 NOTE — Anesthesia Postprocedure Evaluation (Signed)
Anesthesia Post Note  Patient: Steve [name]  Procedure(s) Performed: Procedure(s): NASAL SEPTOPLASTY WITH TURBINATE REDUCTION  Patient location during evaluation: PACU Anesthesia Type: General Level of consciousness: awake Pain management: pain level controlled Vital Signs Assessment: post-procedure vital signs reviewed and stable Respiratory status: spontaneous breathing Cardiovascular status: stable Postop Assessment: no signs of nausea or vomiting Anesthetic complications: no    Last Vitals:  Filed Vitals:   09/19/15 1322 09/19/15 1350  BP: 173/91 161/86  Pulse:  73  Temp:    Resp:  12    Last Pain:  Filed Vitals:   09/19/15 1414  PainSc: 3                  Sevon Rotert

## 2015-09-19 NOTE — Anesthesia Procedure Notes (Signed)
Procedure Name: Intubation Date/Time: 09/19/2015 9:04 AM Performed by: Garrison Columbus T Pre-anesthesia Checklist: Patient identified, Emergency Drugs available, Suction available and Patient being monitored Patient Re-evaluated:Patient Re-evaluated prior to inductionOxygen Delivery Method: Circle system utilized Preoxygenation: Pre-oxygenation with 100% oxygen Intubation Type: IV induction Ventilation: Mask ventilation without difficulty and Oral airway inserted - appropriate to patient size Laryngoscope Size: Sabra Heck and 2 Grade View: Grade I Tube type: Oral Tube size: 7.5 mm Number of attempts: 1 Airway Equipment and Method: Stylet and Oral airway Placement Confirmation: ETT inserted through vocal cords under direct vision,  positive ETCO2 and breath sounds checked- equal and bilateral Secured at: 23 cm Tube secured with: Tape Dental Injury: Teeth and Oropharynx as per pre-operative assessment

## 2015-09-19 NOTE — Op Note (Signed)
09/19/2015  10:36 AM    Liborio Nixon  [id number]   Pre-Op Dx:  Deviated Nasal Septum, Hypertrophic Inferior Turbinates, OSA  Post-op Dx: Same  Proc: Nasal Septoplasty, Bilateral SMR Inferior Turbinates   Surg:  Jodi Marble T MD  Anes:  GOT  EBL:  min  Comp:   nonr  Findings:  RIGHTward septal deviation with maxillary crest spurring.  Bulky inferior turbinates bilateral  Procedure: With the patient in a comfortable supine position,  general orotracheal anesthesia was induced without difficulty.     The patient received preoperative Afrin spray for topical decongestion and vasoconstriction.  Intravenous prophylactic antibiotics were administered.  At an appropriate level, the patient was placed in a semi-sitting position.  A saline moistened throat pack was placed.  Nasal vibrissae were trimmed.  Afrin solution was applied on 0.5" x 3" cottonoids to both sides of the septal mucosa.   1% Xylocaine with 1:100,000 epinephrine, 9 cc's, was infiltrated into the anterior floor of the nose, into the nasal spine region, into the membranous columella, and finally into the submucoperichondrial plane of the septum on both sides.  Several minutes were allowed for this to take effect.  A sterile preparation and draping of the midface was accomplished in the standard fashion.  The materials were removed from the nose and observed to be intact and correct in number.  The nose was inspected with a headlight with the findings as described above.  A LEFT hemitransfixion incision was sharply executed and carried down to the caudal edge of the quadrangular cartilage and continued to a floor incision.  An opposite small floor incision was sharply executed as well.   Floor tunnels were elevated on both sides, carried posteriorly, then medially, then brought forward along the vomer and maxillary crest.  The submucoperichondrial plane of the  LEFT septum was dissected up to the dorsum of the nose, back  onto the perpendicular plate, and brought down and communicated with a floor tunnel and then forward along the maxillary crest.  The flap was generated intact.  The chondroethmoid junction was identified and opened with a Psychologist, educational.  The opposite submucoperiosteal plane of the perpendicular plate of the ethmoid  was elevated and carried down to the floor tunnel posteriorly.  The superior perpendicular plate was lysed with an open Jansen-Middleton forceps.  The inferior portion was dissected from the maxillary crest and vomer with a Cottle elevator.  The midportion was rocked free with a closed KeySpan forceps and then delivered.    The posterior inferior corner of the quadrangular cartilage was submucosally resected, including a cartilaginous tail up along the vomer.     A 1 mm strip of the inferior caudal strut was resected. The maxillary crest posterior to the caudal strut was lowered with mallet and osteotome. The septum was   released from the upper lateral cartilages sharply on both sides.  After mobilizing the septum adequately, and straightening it in the standard fashion,  the septum was secured to the nasal spine with a figure-of-eight 4-0 PDS suture.  A good straight midline configuration of the septum with good dorsal support was generated.  The septal tunnel was suctioned clear.  Hemostasis was observed.  The flaps were laid back down.  The incisions were closed with interrupted 4-0 chromic suture.  Just prior to completing the septoplasty, the inferior turbinates were each infiltrated with additional 1% Xylocaine with 1:100,000 epinephrine,  6 cc's total.  Upon completing the septoplasty, beginning on the RIGHT  side, the inferior turbinate was inspected and infractured.  The anterior hood of the inferior turbinate was sharply lysed just behind the nasal valve.  The medial mucosa of the inferior turbinate was incised in an  anterior upsloping fashion and a laterally based  flap was developed from the turbinate bone.  Using angled turbinate scissors, turbinate bone and lateral mucosa were resected in a posterior downsloping fashion, taking much of the anterior pole and leaving most of the posterior pole.  Bony spicules were submucosally dissected and removed.  The mucosal flap was laid back down and the turbinate was outfractured.  This completed one SMR inferior turbinate.  The opposite side was performed in identical fashion. the bulbous posterior poles and cut mucosal edges were suction coagulated.   Again hemostasis was observed.  After completing both turbinate resections, 0.040" reinforced Silastic splints were fashioned, placed against the nasal septum for support, and secured thereto with a 3-0 Ethilon stitch.   Telfa packs impregnated with bacitracin ointment were placed between the septum and the inferior turbinates, one on each side, for hemostasis and support.   a 6.5 mm nasal trumpet was shortened to reach to the nasopharynx and placed in each side of the nose to assist with postoperative airway.   At this point the procedure was completed.  The pharynx was suctioned free and the throat pack was removed.   The patient was returned to anesthesia, awakened, extubated, and transferred to recovery in stable condition.  Dispo:   PACU to to step down observation care  Plan:  23 hour observation given suspected sleep apnea. Ice, elevation, narcotic analgesia, prophylactic antibiotics for the duration of indwelling nasal foreign bodies.  We will remove the nasal packing In one day, the septal splints in 10 days.  Return to work or school in 10 days, strenuous activities in two weeks.  Tyson Alias MD

## 2015-09-19 NOTE — H&P (View-Only) (Signed)
Steve [name], Steve [name] 54 y.o., male [id number]     Chief Complaint: nasal obstruction  HPI: 54 year old white male comes in for evaluation of chronic recurrent sinus problems.  He claims he has had issues 6 or 7 years or longer.  Right now, he is having yellow drainage and facial pressure.  He has been on Flonase for one month.  He uses Allegra-D every single day.  He often awakens with a sore dry throat.  He does not smoke.  Sometimes she will have earaches and muffled hearing.  No history of polyps, asthma, or prior nasal surgery.  No sinus imaging thus far.   He is scheduled for a knee replacement later this month.  I talked with one of the PAs and Dr. Debroah Loop office who does not feel that the chronic sinus infection is a contraindication to joint replacement as long as we have him under treatment.  2 months recheck.  He had his LEFT knee replaced and is making decent steady progress.  His postoperative course was complicated by shingles.  We gave him a one month course of Augmentin last time we saw him.  He feels like maybe this helped slightly.  He has maintained on Flonase but stopped Allegra.  No facial pain and or pressure.  No nasal drainage.  His nose is still very congested with poor breathing.  This is basically a year round problem for him.   On specific questioning, he snores somewhat but does not think he has sleep apnea.  I asked him to clarify this matter with his wife.  18 months return visit.  His nose still breathes poorly essentially all of the time.  He does use Flonase chronically with perhaps slight improvement.  He did have his LEFT knee replacement, and promptly afterwards had shingles.  He has had a cuff go between pain and swelling and is still not fully functional.  He is aware that he snores, but does not feel that he has sleep apnea.  His wife is not here to comment.   I discussed the proposed surgery namely septoplasty and reduction of turbinates in  basic detail so he can  make a decision and start making plans.  I would like to see him back preoperatively.  Preoperative visit.  We are planning septoplasty and reduction of turbinates.  I discussed the surgery in detail including risks and complications.  Questions were answered and informed consent was obtained.  I discussed advancement of diet and activity, including return to work.  I gave him prescriptions for hydrocodone and Keflex as well as nasal hygiene instructions.  PMH: Past Medical History  Diagnosis Date  . Diabetes mellitus without complication ([name])   . Hypertension   . Hyperlipidemia   . Complication of anesthesia     has night terrors after anesthesia but can be violent when waking up  . 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 Pt stated "I forget alot of things"  . Frequent urination at night     when blood sugar runs high  . Psoriasis of scalp     nose and face; occurs mainly in winter   . Seasonal allergies   . CAD (coronary artery disease)   . Sinus infection 12/16/13  . ADHD (attention deficit hyperactivity disorder)   . Allergy   . Depression   . Anxiety     Surg Hx: Past Surgical History  Procedure Laterality Date  . Hernia repair  2010  abdominal hernia  . Knee arthroscopy  2012    left knee  . Cardiac catheterization      no PCI approx 10 years ago - [place] surgery      right ring finger  . Knee surgery    . Total knee arthroplasty Left 12/28/2013    Procedure: TOTAL KNEE ARTHROPLASTY;  Surgeon: Ninetta Lights, MD;  Location: [facility];  Service: Orthopedics;  Laterality: Left;    FHx:   Family History  Problem Relation Age of Onset  . Diabetes Mother   . Cancer Mother     history breast cancer  . Hypertension Mother   . Cancer Father     prostate  . Alzheimer's disease Father   . Diabetes Father   . Hypertension Father   . Cancer Maternal Uncle     colon  . Colon cancer Maternal Uncle 57  . Cancer Maternal Grandmother      breast  . Cancer Maternal Grandfather     prostate  . Cancer Maternal Uncle     prostate  . Colon cancer Maternal Uncle 28  . Cancer Maternal Uncle     prostate  . Cancer Cousin 47    metastatic colon cancer?  . Colon polyps Paternal Uncle   . Esophageal cancer Neg Hx   . Stomach cancer Neg Hx   . Rectal cancer Neg Hx    SocHx:  reports that he has never smoked. He has never used smokeless tobacco. He reports that he drinks about 0.6 - 1.2 oz of alcohol per week. He reports that he does not use illicit drugs.  ALLERGIES:  Allergies  Allergen Reactions  . Sulfa Antibiotics Itching and Other (See Comments)    Burning also     (Not in a hospital admission)  No results found for this or any previous visit (from the past 48 hour(s)). No results found.  [id number]: Feeling tired (fatigue).  No fever, no night sweats, and no recent weight loss. Head: Headache. Eyes: Eye symptoms. Otolaryngeal: No hearing loss.  Earache.  No tinnitus.  Purulent nasal discharge  and nasal passage blockage (stuffiness).  No snoring.  Sneezing.  No hoarseness.  Sore throat. Cardiovascular: No chest pain or discomfort  and no palpitations. Pulmonary: No dyspnea.  Cough.  No wheezing. Gastrointestinal: No dysphagia  and no heartburn.  No nausea, no abdominal pain, and no melena.  No diarrhea. Genitourinary: No dysuria. Endocrine: No muscle weakness. Musculoskeletal: No calf muscle cramps, no arthralgias, and no soft tissue swelling. Neurological: No dizziness, no fainting, no tingling, and no numbness. Psychological: No anxiety  and no depression. Skin: No rash   BP:115/77,  HR: 78 b/min,  Height: 6 ft 4 in, Weight: 260 lb , BMI: 31.6 kg/m2,    PHYSICAL EXAM: He is muscular.  He is breathing through mouth and nose.  He has a fairly severe rightward septal deviation and bulky turbinates.  Oral cavity shows slight redundancy of the soft palate.  Neck is muscular.   Lungs: Clear to  auscultation Heart: Regular rate and rhythm Abdomen: Soft, active Extremities: Normal configuration Neurologic: symmetric, grossly intact.  Studies Reviewed:Noncontrast CT scan of the paranasal sinuses with images in 3 orthogonal planes shows a slight leftward septal deviation with a prominent chondroethmoid spur.  No active sinus disease anywhere.  Osteomeatal complex is narrow but patent on both sides.    Assessment/Plan Deviated nasal septum (470) (J34.2). Hypertrophy of nasal turbinates (478.0) (J34.3).  We  are going to straighten your nasal septum and reduce the turbinates.  This should make you breathe better both day and night.  Because of possible sleep apnea, we will observe you one night in the hospital then home the following morning.  I will remove your nasal packs before discharge.  You may begin nasal hygiene measures from that point forward.  No strenuous activities for 2 weeks after surgery.  Cephalexin 500 MG Oral Capsule;TAKE 1 CAPSULE 4 TIMES DAILY; Qty40; R0; Rx. Hydrocodone-Acetaminophen 5-325 MG Oral Tablet;1-2 po q4-6h prn pain; [id number]; R0; Rx.    Erik Obey, Maxim Bedel [id number], 9:00 PM

## 2015-09-19 NOTE — Interval H&P Note (Signed)
History and Physical Interval Note:  09/19/2015 8:48 AM  Steve [name]  has presented today for surgery, with the diagnosis of DEVIATED NASAL SEPTUM,HYPERTROPHY INF TURBINATES  The various methods of treatment have been discussed with the patient and family. After consideration of risks, benefits and other options for treatment, the patient has consented to  Procedure(s): NASAL SEPTOPLASTY WITH TURBINATE REDUCTION (N/A) as a surgical intervention .  The patient's history has been re-reviewed, patient re-examined, no change in status, stable for surgery.  I have re-reviewed the patient's chart and labs.  Questions were answered to the patient's satisfaction.     Jodi Marble

## 2015-09-19 NOTE — Transfer of Care (Signed)
Immediate Anesthesia Transfer of Care Note  Patient: Steve [name]  Procedure(s) Performed: Procedure(s): NASAL SEPTOPLASTY WITH TURBINATE REDUCTION  Patient Location: PACU  Anesthesia Type:General  Level of Consciousness: awake, alert  and oriented  Airway & Oxygen Therapy: Patient Spontanous Breathing and Patient connected to face mask oxygen  Post-op Assessment: Report given to RN, Post -op Vital signs reviewed and stable and Patient moving all extremities X 4  Post vital signs: Reviewed and stable  Last Vitals:  Filed Vitals:   09/19/15 0702  BP: 137/71  Pulse: 78  Temp: [id number] C    Complications: No apparent anesthesia complications

## 2015-09-19 NOTE — Anesthesia Preprocedure Evaluation (Addendum)
Anesthesia Evaluation  Patient identified by MRN, date of birth, ID band Patient awake    Reviewed: Allergy & Precautions, NPO status , Patient's Chart, lab work & pertinent test results  History of Anesthesia Complications (+) Emergence Delirium and history of anesthetic complications  Airway Mallampati: II  TM Distance: >3 FB Neck ROM: Full    Dental  (+) Dental Advisory Given, Chipped, Poor Dentition   Pulmonary neg pulmonary ROS,    breath sounds clear to auscultation       Cardiovascular hypertension, Pt. on medications + CAD   Rhythm:Regular     Neuro/Psych neg Seizures PSYCHIATRIC DISORDERS Anxiety Depression  Neuromuscular disease    GI/Hepatic Neg liver ROS, GERD  Medicated and Controlled,  Endo/Other  diabetes, Type 2, Insulin Dependent, Oral Hypoglycemic AgentsMorbid obesity  Renal/GU negative Renal ROS     Musculoskeletal  (+) Arthritis ,   Abdominal   Peds  Hematology negative hematology ROS (+)   Anesthesia Other Findings   Reproductive/Obstetrics                           Anesthesia Physical Anesthesia Plan  ASA: II  Anesthesia Plan: General   Post-op Pain Management:    Induction: Intravenous  Airway Management Planned: Oral ETT  Additional Equipment: None  Intra-op Plan:   Post-operative Plan: Extubation in OR  Informed Consent: I have reviewed the patients History and Physical, chart, labs and discussed the procedure including the risks, benefits and alternatives for the proposed anesthesia with the patient or authorized representative who has indicated his/her understanding and acceptance.   Dental advisory given  Plan Discussed with: CRNA, Surgeon and Anesthesiologist  Anesthesia Plan Comments:        Anesthesia Quick Evaluation

## 2015-09-19 NOTE — Progress Notes (Signed)
09/19/2015 6:58 PM  Steve [name] [id number]  Post-Op Check    Temp:  [97.8 F (36.6 C)-98.7 F (37.1 C)] 98.7 F (37.1 C) (12/07 1444) Pulse Rate:  [65-85] 73 (12/07 1350) Resp:  [10-17] 12 (12/07 1350) BP: (137-181)/(71-97) 161/86 mmHg (12/07 1350) SpO2:  [92 %-100 %] 95 % (12/07 1350) Weight:  [121.11 kg (267 lb)] 121.11 kg (267 lb) (12/07 0702),     Intake/Output Summary (Last 24 hours) at 09/19/15 1858 Last data filed at 09/19/15 1003  Gross per 24 hour  Intake    700 ml  Output     30 ml  Net    670 ml   No void since surgery!  Results for orders placed or performed during the hospital encounter of 09/19/15 (from the past 24 hour(s))  Glucose, capillary     Status: Abnormal   Collection Time: 09/19/15  7:06 AM  Result Value Ref Range   Glucose-Capillary 100 (H) 65 - 99 mg/dL  Glucose, capillary     Status: Abnormal   Collection Time: 09/19/15 10:24 AM  Result Value Ref Range   Glucose-Capillary 120 (H) 65 - 99 mg/dL   Comment 1 Notify RN    Comment 2 Document in Chart   Glucose, capillary     Status: Abnormal   Collection Time: 09/19/15  2:06 PM  Result Value Ref Range   Glucose-Capillary 166 (H) 65 - 99 mg/dL   Comment 1 Notify RN    Comment 2 Document in Chart   Glucose, capillary     Status: Abnormal   Collection Time: 09/19/15  5:03 PM  Result Value Ref Range   Glucose-Capillary 178 (H) 65 - 99 mg/dL   Comment 1 Notify RN    Comment 2 Document in Chart     SUBJECTIVE:  Mod-large pain. No SOB or chest pain. Has not voided yet  OBJECTIVE:  Nasal trumpets patent.  Looks somewhat distressed.  IMPRESSION:  Satisfactory check.   PLAN:  Needs bladder scan and then stand to void, or I/O cath.  Nasal packs and trumpets out in AM, then home if doing well.  Jodi Marble

## 2015-09-20 ENCOUNTER — Encounter (HOSPITAL_COMMUNITY): Payer: Self-pay | Admitting: Otolaryngology

## 2015-09-20 DIAGNOSIS — J342 Deviated nasal septum: Secondary | ICD-10-CM | POA: Diagnosis not present

## 2015-09-20 LAB — GLUCOSE, CAPILLARY: GLUCOSE-CAPILLARY: 129 mg/dL — AB (ref 65–99)

## 2015-09-20 NOTE — Discharge Summary (Signed)
  09/20/2015 11:03 AM  Liborio Nixon [id number]  Post-Op Day 1, Discharge Summary    Temp:  [96.9 F (36.1 C)-98.7 F (37.1 C)] 96.9 F (36.1 C) (12/08 0746) Pulse Rate:  [65-85] 81 (12/08 0746) Resp:  [10-14] 14 (12/08 0746) BP: (138-181)/(72-97) 147/76 mmHg (12/08 0959) SpO2:  [92 %-97 %] 94 % (12/08 0746) Weight:  [121.2 kg (267 lb 3.2 oz)] 121.2 kg (267 lb 3.2 oz) (12/07 1455),     Intake/Output Summary (Last 24 hours) at 09/20/15 1103 Last data filed at 09/20/15 0746  Gross per 24 hour  Intake 1278.5 ml  Output   3200 ml  Net -1921.5 ml    Results for orders placed or performed during the hospital encounter of 09/19/15 (from the past 24 hour(s))  Glucose, capillary     Status: Abnormal   Collection Time: 09/19/15  2:06 PM  Result Value Ref Range   Glucose-Capillary 166 (H) 65 - 99 mg/dL   Comment 1 Notify RN    Comment 2 Document in Chart   Glucose, capillary     Status: Abnormal   Collection Time: 09/19/15  5:03 PM  Result Value Ref Range   Glucose-Capillary 178 (H) 65 - 99 mg/dL   Comment 1 Notify RN    Comment 2 Document in Chart   Glucose, capillary     Status: Abnormal   Collection Time: 09/19/15  8:31 PM  Result Value Ref Range   Glucose-Capillary 190 (H) 65 - 99 mg/dL  Glucose, capillary     Status: Abnormal   Collection Time: 09/20/15  8:24 AM  Result Value Ref Range   Glucose-Capillary 129 (H) 65 - 99 mg/dL   Comment 1 Notify RN    Comment 2 Document in Chart     SUBJECTIVE:  Large pain.  spont void.  Breathing OK.  Min bleeding  OBJECTIVE:  Color/energy OK.  Packs removed.  Mild bleeding stopped spontaneously.  IMPRESSION:  Satisfactory check  PLAN:  Ice, elevation, nasal hygiene measures, splints out in 10 days.  Admit:  7 DEC Discharge:  8 DEC Final Diagnosis:  Deviated nasal septum, hypertrophic inferior nasal turbinates, diabetes. Obstructive sleep apena Proc:  Septoplasty, SMR turbinates, 7 DEC Comp:  None Cond: ambulatory. Pain  controlled.  Packs out. spont void.  Breathing well.  Taking decent po's. Recheck: 9 days my office Rx's:   Hydrocodone, Cephalexin Instructions written and given  Hosp Course:  Underwent surgery on day of admission.  Observed 23 hr overnight given suspected OSA.  Packs out AM POD 1, discharged to home and care of family.   Jodi Marble

## 2015-09-20 NOTE — Discharge Instructions (Signed)
Keep head elevated x 3-4 nights Ice pack can be discarded after 24 hrs Change drip pad as often as needed No strenuous activity x 2 weeks Begin nasal hygiene measures after pack removal . OK to rinse throat to clear old blood and phlegm Diet as comfortable Recheck my office 9 days, [phone] for an appointment. Call for bleeding, signs of infection.

## 2015-09-20 NOTE — Progress Notes (Signed)
Patient discharge instructions reviewed with patient. Patient verbalizes understanding of all instructions. Patient has prescriptions for keflex and pain medication at home as it was given to him prior to surgery. Patient advised to take his medication as prescribed. Patient questions answered. Patient discharged via wheelchair using our volunteer services.

## 2015-10-01 ENCOUNTER — Telehealth: Payer: Self-pay | Admitting: Physician Assistant

## 2015-10-01 DIAGNOSIS — I10 Essential (primary) hypertension: Secondary | ICD-10-CM

## 2015-10-01 DIAGNOSIS — R1010 Upper abdominal pain, unspecified: Secondary | ICD-10-CM

## 2015-10-01 MED ORDER — OMEPRAZOLE 40 MG PO CPDR: 40.0000 mg | DELAYED_RELEASE_CAPSULE | Freq: Every day | ORAL | Status: DC

## 2015-10-01 MED ORDER — ATORVASTATIN CALCIUM 10 MG PO TABS: 10.0000 mg | ORAL_TABLET | Freq: Every day | ORAL | Status: DC

## 2015-10-01 MED ORDER — GABAPENTIN 300 MG PO CAPS: 300.0000 mg | ORAL_CAPSULE | Freq: Three times a day (TID) | ORAL | Status: DC

## 2015-10-01 MED ORDER — HYDROCODONE-ACETAMINOPHEN 5-325 MG PO TABS: 1.0000 | ORAL_TABLET | Freq: Four times a day (QID) | ORAL | Status: DC | PRN

## 2015-10-01 MED ORDER — LISINOPRIL 5 MG PO TABS: 5.0000 mg | ORAL_TABLET | Freq: Every day | ORAL | Status: DC

## 2015-10-01 MED ORDER — METFORMIN HCL 1000 MG PO TABS
1000.0000 mg | ORAL_TABLET | Freq: Two times a day (BID) | ORAL | Status: DC
Start: 2015-10-01 — End: 2015-12-15

## 2015-10-01 NOTE — Telephone Encounter (Signed)
Reminder: Janace Hoard is working with Einar Pheasant at this time, forwarded to CMA/SLS

## 2015-10-01 NOTE — Telephone Encounter (Signed)
Called and [facility] @ 2:09pm @ [phone]) informing the pt of the note below.  Also informed him that the prescription for the Hydrocodone with be placed up front for him to pick up.   Rx's sent to the pharmacy by e-script.//AB/CMA

## 2015-10-01 NOTE — Telephone Encounter (Signed)
Relation to [id number] Call back [phone] Pharmacy: [facility] ([place] [place] [id number] (Phone) [phone] (Fax)         Reason for call: patient requesting a refill HYDROcodone-acetaminophen (NORCO) 5-325 MG tablet  atorvastatin (LIPITOR) 10 MG tablet  gabapentin (NEURONTIN) 300 MG capsule  lisinopril (PRINIVIL,ZESTRIL) 5 MG tablet  metFORMIN (GLUCOPHAGE) 1000 MG tablet  omeprazole (PRILOSEC) 40 MG capsule

## 2015-10-01 NOTE — Telephone Encounter (Signed)
Ok to refill all -- 30 day supplies with 1 refill except for hydrocodone which is 30-days only. He needs a follow-up with me before further fills will be given

## 2015-10-02 ENCOUNTER — Telehealth: Payer: Self-pay | Admitting: Physician Assistant

## 2015-10-02 DIAGNOSIS — M170 Bilateral primary osteoarthritis of knee: Secondary | ICD-10-CM

## 2015-10-02 NOTE — Telephone Encounter (Signed)
Pt wife called. He needs referral to [facility] for f/u from knee replacement last year. They need referral prior to scheduling with pt.

## 2015-10-02 NOTE — Telephone Encounter (Signed)
Called and [facility] @ 1:34pm @ [phone]) informing the pt of the note below regarding the referral to Ortho.   Asked the pt to give me a call back if he has any questions.//AB/CMA

## 2015-10-02 NOTE — Telephone Encounter (Signed)
Please advise.//AB/CMA 

## 2015-10-02 NOTE — Telephone Encounter (Signed)
Referral placed. Patient should be able to schedule an appointment.

## 2015-10-09 ENCOUNTER — Telehealth: Payer: Self-pay | Admitting: Physician Assistant

## 2015-10-09 NOTE — Telephone Encounter (Signed)
Caller name:Lascano, Marchia Bond V Relation to [id number]  Call back [phone] Pharmacy:  Reason for call:  As per spouse [facility] didn't receive referral and patient has an appointment 10/10/15 at 10am.

## 2015-10-12 ENCOUNTER — Ambulatory Visit (INDEPENDENT_AMBULATORY_CARE_PROVIDER_SITE_OTHER): Payer: 59 | Admitting: Medical

## 2015-10-12 ENCOUNTER — Encounter: Payer: Self-pay | Admitting: Medical

## 2015-10-12 VITALS — BP 126/82 | HR 90 | Temp 97.8°F | Ht 76.0 in | Wt 272.0 lb

## 2015-10-12 DIAGNOSIS — E119 Type 2 diabetes mellitus without complications: Secondary | ICD-10-CM

## 2015-10-12 DIAGNOSIS — M5431 Sciatica, right side: Secondary | ICD-10-CM | POA: Diagnosis not present

## 2015-10-12 DIAGNOSIS — E785 Hyperlipidemia, unspecified: Secondary | ICD-10-CM

## 2015-10-12 DIAGNOSIS — I10 Essential (primary) hypertension: Secondary | ICD-10-CM | POA: Diagnosis not present

## 2015-10-12 DIAGNOSIS — Z794 Long term (current) use of insulin: Secondary | ICD-10-CM

## 2015-10-12 LAB — COMPREHENSIVE METABOLIC PANEL
ALT: 25 U/L (ref 0–53)
AST: 22 U/L (ref 0–37)
Albumin: 4.3 g/dL (ref 3.5–5.2)
Alkaline Phosphatase: 67 U/L (ref 39–117)
BUN: 8 mg/dL (ref 6–23)
CHLORIDE: 101 meq/L (ref 96–112)
CO2: 29 mEq/L (ref 19–32)
Calcium: 9.4 mg/dL (ref 8.4–10.5)
Creatinine, Ser: 0.81 mg/dL (ref 0.40–1.50)
GFR: 106.14 mL/min (ref >60.00)
GLUCOSE: 185 mg/dL — AB (ref 70–99)
POTASSIUM: 4.3 meq/L (ref 3.5–5.1)
SODIUM: 138 meq/L (ref 135–145)
Total Bilirubin: 0.5 mg/dL (ref 0.2–1.2)
Total Protein: 7.3 g/dL (ref 6.0–8.3)

## 2015-10-12 LAB — LIPID PANEL
CHOL/HDL RATIO: 4
Cholesterol: 157 mg/dL (ref 0–200)
HDL: 36.9 mg/dL — AB (ref >39.00)
NonHDL: 120.09
Triglycerides: 285 mg/dL — ABNORMAL HIGH (ref 0.0–149.0)
VLDL: 57 mg/dL — AB (ref 0.0–40.0)

## 2015-10-12 LAB — LDL CHOLESTEROL, DIRECT: Direct LDL: 72 mg/dL

## 2015-10-12 MED ORDER — ATORVASTATIN CALCIUM 10 MG PO TABS: 10.0000 mg | ORAL_TABLET | Freq: Every day | ORAL | Status: DC

## 2015-10-12 MED ORDER — LISINOPRIL 5 MG PO TABS: 5.0000 mg | ORAL_TABLET | Freq: Every day | ORAL | Status: DC

## 2015-10-12 MED ORDER — INSULIN NPH (HUMAN) (ISOPHANE) 100 UNIT/ML ~~LOC~~ SUSP: SUBCUTANEOUS | Status: DC

## 2015-10-12 NOTE — Progress Notes (Signed)
Subjective:    Patient ID: Steve [name], male    DOB: [birth year], 54 y.o.   MRN: [id number]  HPI   Pt last a1-c was November 30 , 2016(level was 9.8). He needs refill of his medicationHe needs refill of his humulin and the lisinopril. Pt has adequate humalog.    Pt states his sugars have been 120-150 sugar readings at night most of the time. In am fasting 117. Highest sugar reading recently 250 at night. Pt admits to forgetting to take diabetic medication at times since he is very busy. Pt has been walking about mile a day. Pt admits diet not good since holidays.  Pt has hx of hyperlipidemia. He is fasting. No recently lipid panel.  htn history. Bp is controlled today. No cardiac or neurologic signs or symptoms.  At end of exam/interview mentioned 1 wk rt sciatic area mild discomfort. When he changes position pain increase. No mid lumbar pain.    Review of Systems  Constitutional: Negative for fever, chills, diaphoresis, activity change and fatigue.  Respiratory: Negative for cough, chest tightness and shortness of breath.   Cardiovascular: Negative for chest pain, palpitations and leg swelling.  Gastrointestinal: Negative for nausea, vomiting and abdominal pain.  Musculoskeletal: Negative for neck pain and neck stiffness.       Rt sciatica area pain.  Neurological: Negative for dizziness, weakness and headaches.  Psychiatric/Behavioral: Negative for behavioral problems, confusion and agitation. The patient is not nervous/anxious.     Past Medical History  Diagnosis Date  . Diabetes mellitus without complication ([place])   . Hypertension   . Hyperlipidemia   . Complication of anesthesia     has night terrors after anesthesia but can be violent when waking up  . 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 Pt stated "I forget alot of things"  . Frequent urination at night     when blood sugar runs high  . Psoriasis of scalp     nose and face; occurs mainly in winter    . Seasonal allergies   . CAD (coronary artery disease)   . Sinus infection 12/16/13  . ADHD (attention deficit hyperactivity disorder)   . Allergy   . Depression   . Anxiety     Social History   Social History  . Marital Status: Married    Spouse Name: N/A  . Number of Children: 6  . Years of Education: N/A   Occupational History  . Not on file.   Social History Main Topics  . Smoking status: Never Smoker   . Smokeless tobacco: Never Used  . Alcohol Use: 0.6 - 1.2 oz/week    1-2 Cans of beer per week     Comment: rare beer  . Drug Use: No  . Sexual Activity:    Partners: Female   Other Topics Concern  . Not on file   Social History Narrative   Regular exercise: very little   Caffeine use: sweet tea daily   6 children   Works in Architect, owns concession.   Married   Enjoys televition.      Past Surgical History  Procedure Laterality Date  . Hernia repair  2010    abdominal hernia  . Knee arthroscopy  2012    left knee  . Cardiac catheterization      no PCI approx 10 years ago - [place] surgery      right ring finger  .  Knee surgery    . Total knee arthroplasty Left 12/28/2013    Procedure: TOTAL KNEE ARTHROPLASTY;  Surgeon: Ninetta Lights, MD;  Location: [facility];  Service: Orthopedics;  Laterality: Left;  . Nasal septoplasty w/ turbinoplasty  09/19/2015    Procedure: NASAL SEPTOPLASTY WITH TURBINATE REDUCTION;  Surgeon: Jodi Marble, MD;  Location: [facility] OR;  Service: ENT;;    Family History  Problem Relation Age of Onset  . Diabetes Mother   . Cancer Mother     history breast cancer  . Hypertension Mother   . Cancer Father     prostate  . Alzheimer's disease Father   . Diabetes Father   . Hypertension Father   . Cancer Maternal Uncle     colon  . Colon cancer Maternal Uncle 11  . Cancer Maternal Grandmother     breast  . Cancer Maternal Grandfather     prostate  . Cancer Maternal Uncle     prostate  . Colon cancer  Maternal Uncle 67  . Cancer Maternal Uncle     prostate  . Cancer Cousin 47    metastatic colon cancer?  . Colon polyps Paternal Uncle   . Esophageal cancer Neg Hx   . Stomach cancer Neg Hx   . Rectal cancer Neg Hx     Allergies  Allergen Reactions  . Sulfa Antibiotics Itching and Other (See Comments)    Burning also    Current Outpatient Prescriptions on File Prior to Visit  Medication Sig Dispense Refill  . aspirin EC 325 MG tablet Take 1 tablet (325 mg total) by mouth daily. 30 tablet 0  . atorvastatin (LIPITOR) 10 MG tablet Take 1 tablet (10 mg total) by mouth daily. 30 tablet 1  . gabapentin (NEURONTIN) 300 MG capsule Take 1 capsule (300 mg total) by mouth 3 (three) times daily. 90 capsule 1  . insulin lispro (HUMALOG KWIKPEN) 100 UNIT/ML KiwkPen Inject 0-12 Units into the skin 3 (three) times daily with meals. sliding scale- check sugar and inject 3 times daily before meals as below: <150- Zero units 150-200 2 units 201-250 4 units 251-300 6 units 301-350 8 units 351-400 10 units >400 12 units and contact us. 15 mL 11  . JANUVIA 100 MG tablet TAKE ONE TABLET BY MOUTH ONCE DAILY 30 tablet 5  . metFORMIN (GLUCOPHAGE) 1000 MG tablet Take 1 tablet (1,000 mg total) by mouth 2 (two) times daily with a meal. 60 tablet 1  . Multiple Vitamins-Minerals (ZINC PO) Take 1 tablet by mouth daily.     Marland Kitchen omeprazole (PRILOSEC) 40 MG capsule Take 1 capsule (40 mg total) by mouth daily. 30 capsule 1   No current facility-administered medications on file prior to visit.    BP 126/82 mmHg  Pulse 90  Temp(Src) 97.8 F (36.6 C) (Oral)  Ht 6\' 4"  (1.93 m)  Wt 272 lb (123.378 kg)  BMI 33.12 kg/m2  SpO2 97%       Objective:   Physical Exam  General Mental Status- Alert. General Appearance- Not in acute distress.   Skin General: Color- Normal Color. Moisture- Normal Moisture.  Neck Carotid Arteries- Normal color. Moisture- Normal Moisture. No carotid bruits. No JVD.  Chest and Lung  Exam Auscultation: Breath Sounds:-Normal.  Cardiovascular Auscultation:Rythm- Regular. Murmurs & Other Heart Sounds:Auscultation of the heart reveals- No Murmurs.  Abdomen Inspection:-Inspeection Normal. Palpation/Percussion:Note:No mass. Palpation and Percussion of the abdomen reveal- Non Tender, Non Distended + BS, no rebound or guarding.  Neurologic Cranial Nerve exam:- CN III-XII intact(No nystagmus), symmetric smile. Strength:- 5/5 equal and symmetric strength both upper and lower extremities. Lower ext- see quality metrics.   Back- no lumbar pain but mild rt sciatic region tender to palpation.Pain on changing positions sitting to standing.         Assessment & Plan:  For diabetes. I refilled your insulin today. Continue current regimen and follow up early march for recheck and a1-c. Recommend better diet as you admitted to typical holiday diet. Remember to take your meds.  For htn refill lisinopril. Bp well controlled today.  Will check lipid panel today and cmp. Then refill lipid med accordingy.  Follow up early march or as needed  For rt sciatica advised conservative measures and alleve otc. If persists then consider further tx. Advised notify us how he is in a week

## 2015-10-12 NOTE — Patient Instructions (Addendum)
For diabetes. I refilled your insulin today. Continue current regimen and follow up early march for recheck and a1-c. Recommend better diet as you admitted to typical holiday diet. Remember to take your meds.  For htn refill lisinopril. Bp well controlled today.  Will check lipid panel today and cmp. Then refill lipid med accordingy.  Follow up early march or as needed  For rt sciatica advised conservative measures and alleve otc. If persists then consider further tx. Advised notify us how he is in a week

## 2015-10-12 NOTE — Progress Notes (Signed)
Pre visit review using our clinic review tool, if applicable. No additional management support is needed unless otherwise documented below in the visit note. 

## 2015-12-04 ENCOUNTER — Telehealth: Payer: Self-pay | Admitting: General Practice

## 2015-12-04 NOTE — Telephone Encounter (Signed)
PA received for Januvia #[id number], this PA was started in covermymeds today.

## 2015-12-05 ENCOUNTER — Other Ambulatory Visit: Payer: Self-pay | Admitting: *Deleted

## 2015-12-05 NOTE — Telephone Encounter (Signed)
Received Denial for coverage of Januvia; only medication that must be tried is Onglyza/SLS 02/22

## 2015-12-05 NOTE — Telephone Encounter (Signed)
Error

## 2015-12-06 NOTE — Telephone Encounter (Signed)
Ok to send in Rx Onglyza 5 mg -- quantity 30 with 1 refill to take when Januvia runs out. Please inform patient the reason for change. Also we have vouchers he can pick up to hopefully make medication free. Will see him at follow-up in March

## 2015-12-10 MED ORDER — SAXAGLIPTIN HCL 5 MG PO TABS: 5.0000 mg | ORAL_TABLET | Freq: Every day | ORAL | Status: DC

## 2015-12-10 NOTE — Telephone Encounter (Signed)
LMOM with contact name and number [for return call, if needed] RE: change in medication due to Insurance non-coverage per provider instructions; Rx to pharmacy & savings card at front office for pick-up/SLS 03/17

## 2015-12-15 ENCOUNTER — Other Ambulatory Visit: Payer: Self-pay | Admitting: Physician Assistant

## 2015-12-18 ENCOUNTER — Ambulatory Visit (INDEPENDENT_AMBULATORY_CARE_PROVIDER_SITE_OTHER): Payer: BLUE CROSS/BLUE SHIELD | Admitting: Physician Assistant

## 2015-12-18 ENCOUNTER — Encounter: Payer: Self-pay | Admitting: Physician Assistant

## 2015-12-18 VITALS — BP 140/78 | HR 90 | Temp 98.1°F | Ht 76.0 in | Wt 271.0 lb

## 2015-12-18 DIAGNOSIS — E1140 Type 2 diabetes mellitus with diabetic neuropathy, unspecified: Secondary | ICD-10-CM | POA: Diagnosis not present

## 2015-12-18 LAB — LIPID PANEL
CHOL/HDL RATIO: 4
Cholesterol: 161 mg/dL (ref 0–200)
HDL: 37.5 mg/dL — AB (ref >39.00)
NonHDL: 123.63
Triglycerides: 342 mg/dL — ABNORMAL HIGH (ref 0.0–149.0)
VLDL: 68.4 mg/dL — AB (ref 0.0–40.0)

## 2015-12-18 LAB — COMPREHENSIVE METABOLIC PANEL
ALBUMIN: 4.4 g/dL (ref 3.5–5.2)
ALT: 31 U/L (ref 0–53)
AST: 22 U/L (ref 0–37)
Alkaline Phosphatase: 68 U/L (ref 39–117)
BUN: 9 mg/dL (ref 6–23)
CHLORIDE: 101 meq/L (ref 96–112)
CO2: 25 meq/L (ref 19–32)
CREATININE: 0.85 mg/dL (ref 0.40–1.50)
Calcium: 9.8 mg/dL (ref 8.4–10.5)
GFR: 100.33 mL/min (ref >60.00)
GLUCOSE: 264 mg/dL — AB (ref 70–99)
POTASSIUM: 4 meq/L (ref 3.5–5.1)
SODIUM: 138 meq/L (ref 135–145)
Total Bilirubin: 0.5 mg/dL (ref 0.2–1.2)
Total Protein: 7 g/dL (ref 6.0–8.3)

## 2015-12-18 LAB — LDL CHOLESTEROL, DIRECT: LDL DIRECT: 59 mg/dL

## 2015-12-18 LAB — HEMOGLOBIN A1C: HEMOGLOBIN A1C: 10 % — AB (ref 4.6–6.5)

## 2015-12-18 MED ORDER — SAXAGLIPTIN HCL 5 MG PO TABS: 5.0000 mg | ORAL_TABLET | Freq: Every day | ORAL | Status: DC

## 2015-12-18 MED ORDER — GABAPENTIN 300 MG PO CAPS: ORAL_CAPSULE | ORAL | Status: AC

## 2015-12-18 MED ORDER — METFORMIN HCL 1000 MG PO TABS: 1000.0000 mg | ORAL_TABLET | Freq: Two times a day (BID) | ORAL | Status: DC

## 2015-12-18 NOTE — Patient Instructions (Signed)
Please go to the lab for blood work. I will call with your results.  Start the [place], taking as directed. Continue Metformin as directed. Start the new dose of Gabapentin.  Check fasting sugar every day. Follow-up 1 month.  You will be contacted for second opinion with Podiatry.

## 2015-12-18 NOTE — Progress Notes (Signed)
Patient presents to clinic today for follow-up of Diabetes Mellitus II, previously uncontrolled. Patient had to be switched from [place] to Onglyza due to change in formulary. Patient has not picked up medications. Has only been taking Metformin as directed. Has not been checking his sugars. Has been trying to watch diet.   Past Medical History  Diagnosis Date  . Diabetes mellitus without complication ([place])   . Hypertension   . Hyperlipidemia   . Complication of anesthesia     has night terrors after anesthesia but can be violent when waking up  . 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 Pt stated "I forget alot of things"  . Frequent urination at night     when blood sugar runs high  . Psoriasis of scalp     nose and face; occurs mainly in winter   . Seasonal allergies   . CAD (coronary artery disease)   . Sinus infection 12/16/13  . ADHD (attention deficit hyperactivity disorder)   . Allergy   . Depression   . Anxiety     Current Outpatient Prescriptions on File Prior to Visit  Medication Sig Dispense Refill  . aspirin EC 325 MG tablet Take 1 tablet (325 mg total) by mouth daily. 30 tablet 0  . atorvastatin (LIPITOR) 10 MG tablet Take 1 tablet (10 mg total) by mouth daily. 30 tablet 3  . insulin lispro (HUMALOG KWIKPEN) 100 UNIT/ML KiwkPen Inject 0-12 Units into the skin 3 (three) times daily with meals. sliding scale- check sugar and inject 3 times daily before meals as below: <150- Zero units 150-200 2 units 201-250 4 units 251-300 6 units 301-350 8 units 351-400 10 units >400 12 units and contact us. 15 mL 11  . insulin NPH Human (HUMULIN N,NOVOLIN N) 100 UNIT/ML injection 30 units in the morning and 30-40 units at bedtime. 10 mL 2  . lisinopril (PRINIVIL,ZESTRIL) 5 MG tablet Take 1 tablet (5 mg total) by mouth daily. 30 tablet 6  . Multiple Vitamins-Minerals (ZINC PO) Take 1 tablet by mouth daily.      No current facility-administered medications on file prior to  visit.    Allergies  Allergen Reactions  . Sulfa Antibiotics Itching and Other (See Comments)    Burning also    Family History  Problem Relation Age of Onset  . Diabetes Mother   . Cancer Mother     history breast cancer  . Hypertension Mother   . Cancer Father     prostate  . Alzheimer's disease Father   . Diabetes Father   . Hypertension Father   . Cancer Maternal Uncle     colon  . Colon cancer Maternal Uncle 56  . Cancer Maternal Grandmother     breast  . Cancer Maternal Grandfather     prostate  . Cancer Maternal Uncle     prostate  . Colon cancer Maternal Uncle 20  . Cancer Maternal Uncle     prostate  . Cancer Cousin 47    metastatic colon cancer?  . Colon polyps Paternal Uncle   . Esophageal cancer Neg Hx   . Stomach cancer Neg Hx   . Rectal cancer Neg Hx     Social History   Social History  . Marital Status: Married    Spouse Name: N/A  . Number of Children: 6  . Years of Education: N/A   Social History Main Topics  . Smoking status: Never Smoker   .  Smokeless tobacco: Never Used  . Alcohol Use: 0.6 - 1.2 oz/week    1-2 Cans of beer per week     Comment: rare beer  . Drug Use: No  . Sexual Activity:    Partners: Female   Other Topics Concern  . None   Social History Narrative   Regular exercise: very little   Caffeine use: sweet tea daily   6 children   Works in Architect, owns concession.   Married   Enjoys televition.     Review of Systems - See HPI.  All other ROS are negative.  BP 140/78 mmHg  Pulse 90  Temp(Src) 98.1 F (36.7 C) (Oral)  Ht '6\' 4"'$  (1.93 m)  Wt 271 lb (122.925 kg)  BMI 33.00 kg/m2  SpO2 96%  Physical Exam  Constitutional: He is oriented to person, place, and time and well-developed, well-nourished, and in no distress.  HENT:  Head: Normocephalic and atraumatic.  Right Ear: External ear normal.  Left Ear: External ear normal.  Eyes: Conjunctivae are normal.  Neck: Neck supple.  Cardiovascular:  Normal rate, regular rhythm, normal heart sounds and intact distal pulses.   Pulmonary/Chest: Effort normal and breath sounds normal. No respiratory distress. He has no wheezes. He has no rales. He exhibits no tenderness.  Neurological: He is alert and oriented to person, place, and time.  Skin: Skin is warm and dry. No rash noted.  Psychiatric: Affect normal.  Vitals reviewed.   Recent Results (from the past 2160 hour(s))  Comprehensive metabolic panel     Status: Abnormal   Collection Time: 10/12/15 11:09 AM  Result Value Ref Range   Sodium 138 135 - 145 mEq/L   Potassium 4.3 3.5 - 5.1 mEq/L   Chloride 101 96 - 112 mEq/L   CO2 29 19 - 32 mEq/L   Glucose, Bld 185 (H) 70 - 99 mg/dL   BUN 8 6 - 23 mg/dL   Creatinine, Ser 0.81 0.40 - 1.50 mg/dL   Total Bilirubin 0.5 0.2 - 1.2 mg/dL   Alkaline Phosphatase 67 39 - 117 U/L   AST 22 0 - 37 U/L   ALT 25 0 - 53 U/L   Total Protein 7.3 6.0 - 8.3 g/dL   Albumin 4.3 3.5 - 5.2 g/dL   Calcium 9.4 8.4 - 10.5 mg/dL   GFR 106.14 >60.00 mL/min  Lipid panel     Status: Abnormal   Collection Time: 10/12/15 11:09 AM  Result Value Ref Range   Cholesterol 157 0 - 200 mg/dL    Comment: ATP III Classification       Desirable:  < 200 mg/dL               Borderline High:  200 - 239 mg/dL          High:  > = 240 mg/dL   Triglycerides 285.0 (H) 0.0 - 149.0 mg/dL    Comment: Normal:  <150 mg/dLBorderline High:  150 - 199 mg/dL   HDL 36.90 (L) >39.00 mg/dL   VLDL 57.0 (H) 0.0 - 40.0 mg/dL   Total CHOL/HDL Ratio 4     Comment:                Men          Women1/2 Average Risk     3.4          3.3Average Risk          5.0  4.42X Average Risk          9.6          7.13X Average Risk          15.0          11.0                       NonHDL 120.09     Comment: NOTE:  Non-HDL goal should be 30 mg/dL higher than patient's LDL goal (i.e. LDL goal of < 70 mg/dL, would have non-HDL goal of < 100 mg/dL)  LDL cholesterol, direct     Status: None   Collection  Time: 10/12/15 11:09 AM  Result Value Ref Range   Direct LDL 72.0 mg/dL    Comment: Optimal:  <100 mg/dLNear or Above Optimal:  100-129 mg/dLBorderline High:  130-159 mg/dLHigh:  160-189 mg/dLVery High:  >190 mg/dL  Comp Met (CMET)     Status: Abnormal   Collection Time: 12/18/15  9:59 AM  Result Value Ref Range   Sodium 138 135 - 145 mEq/L   Potassium 4.0 3.5 - 5.1 mEq/L   Chloride 101 96 - 112 mEq/L   CO2 25 19 - 32 mEq/L   Glucose, Bld 264 (H) 70 - 99 mg/dL   BUN 9 6 - 23 mg/dL   Creatinine, Ser 0.85 0.40 - 1.50 mg/dL   Total Bilirubin 0.5 0.2 - 1.2 mg/dL   Alkaline Phosphatase 68 39 - 117 U/L   AST 22 0 - 37 U/L   ALT 31 0 - 53 U/L   Total Protein 7.0 6.0 - 8.3 g/dL   Albumin 4.4 3.5 - 5.2 g/dL   Calcium 9.8 8.4 - 10.5 mg/dL   GFR 100.33 >60.00 mL/min  Hemoglobin A1c     Status: Abnormal   Collection Time: 12/18/15  9:59 AM  Result Value Ref Range   Hgb A1c MFr Bld 10.0 (H) 4.6 - 6.5 %    Comment: Glycemic Control Guidelines for People with Diabetes:Non Diabetic:  <6%Goal of Therapy: <7%Additional Action Suggested:  >8%   Lipid Profile     Status: Abnormal   Collection Time: 12/18/15  9:59 AM  Result Value Ref Range   Cholesterol 161 0 - 200 mg/dL    Comment: ATP III Classification       Desirable:  < 200 mg/dL               Borderline High:  200 - 239 mg/dL          High:  > = 240 mg/dL   Triglycerides 342.0 (H) 0.0 - 149.0 mg/dL    Comment: Normal:  <150 mg/dLBorderline High:  150 - 199 mg/dL   HDL 37.50 (L) >39.00 mg/dL   VLDL 68.4 (H) 0.0 - 40.0 mg/dL   Total CHOL/HDL Ratio 4     Comment:                Men          Women1/2 Average Risk     3.4          3.3Average Risk          5.0          4.42X Average Risk          9.6          7.13X Average Risk          15.0  11.0                       NonHDL 123.63     Comment: NOTE:  Non-HDL goal should be 30 mg/dL higher than patient's LDL goal (i.e. LDL goal of < 70 mg/dL, would have non-HDL goal of < 100 mg/dL)  LDL  cholesterol, direct     Status: None   Collection Time: 12/18/15  9:59 AM  Result Value Ref Range   Direct LDL 59.0 mg/dL    Comment: Optimal:  <100 mg/dLNear or Above Optimal:  100-129 mg/dLBorderline High:  130-159 mg/dLHigh:  160-189 mg/dLVery High:  >190 mg/dL    Assessment/Plan: DM neuropathy, type II diabetes mellitus Will repeat A1C, CMP and lipid panel today. Patient to begin onglyza as directed. Continue Metformin. Discussed again the importance of compliance with diabetic medications and sugar checks. Follow-up 1 month.

## 2015-12-18 NOTE — Progress Notes (Signed)
Pre visit review using our clinic review tool, if applicable. No additional management support is needed unless otherwise documented below in the visit note. 

## 2015-12-23 ENCOUNTER — Other Ambulatory Visit: Payer: Self-pay | Admitting: Physician Assistant

## 2015-12-23 DIAGNOSIS — M79671 Pain in right foot: Secondary | ICD-10-CM

## 2015-12-23 DIAGNOSIS — M79672 Pain in left foot: Secondary | ICD-10-CM

## 2015-12-23 NOTE — Assessment & Plan Note (Signed)
Will repeat A1C, CMP and lipid panel today. Patient to begin onglyza as directed. Continue Metformin. Discussed again the importance of compliance with diabetic medications and sugar checks. Follow-up 1 month.

## 2016-01-09 ENCOUNTER — Ambulatory Visit (INDEPENDENT_AMBULATORY_CARE_PROVIDER_SITE_OTHER): Payer: BLUE CROSS/BLUE SHIELD | Admitting: Podiatry

## 2016-01-09 ENCOUNTER — Ambulatory Visit (HOSPITAL_BASED_OUTPATIENT_CLINIC_OR_DEPARTMENT_OTHER)
Admission: RE | Admit: 2016-01-09 | Discharge: 2016-01-09 | Disposition: A | Discharge status: Home / Self Care | Payer: BLUE CROSS/BLUE SHIELD | Source: Ambulatory Visit | Attending: Podiatry | Admitting: Podiatry

## 2016-01-09 ENCOUNTER — Telehealth: Payer: Self-pay | Admitting: Physician Assistant

## 2016-01-09 ENCOUNTER — Encounter: Payer: Self-pay | Admitting: Podiatry

## 2016-01-09 VITALS — BP 116/69 | HR 89 | Resp 18

## 2016-01-09 DIAGNOSIS — M2040 Other hammer toe(s) (acquired), unspecified foot: Secondary | ICD-10-CM

## 2016-01-09 DIAGNOSIS — M722 Plantar fascial fibromatosis: Secondary | ICD-10-CM

## 2016-01-09 DIAGNOSIS — I10 Essential (primary) hypertension: Secondary | ICD-10-CM

## 2016-01-09 MED ORDER — ATORVASTATIN CALCIUM 10 MG PO TABS: 10.0000 mg | ORAL_TABLET | Freq: Every day | ORAL | Status: DC

## 2016-01-09 MED ORDER — INSULIN LISPRO 100 UNIT/ML (KWIKPEN): PEN_INJECTOR | SUBCUTANEOUS | Status: DC

## 2016-01-09 MED ORDER — LISINOPRIL 5 MG PO TABS: 5.0000 mg | ORAL_TABLET | Freq: Every day | ORAL | Status: DC

## 2016-01-09 MED ORDER — INSULIN NPH (HUMAN) (ISOPHANE) 100 UNIT/ML ~~LOC~~ SUSP: SUBCUTANEOUS | Status: DC

## 2016-01-09 NOTE — Telephone Encounter (Signed)
Caller name: Rakeen Relation to pt: self Call back number: [phone] Pharmacy: [facility] [place] ([place]  - [place]  Reason for call: Pt came in office stating is out of meds with: Aspirin 325 mg, Lipitor 10 mg, Gabapentin 300 mg, Insulin Lispro Humalog 100 unit / ML, Insulin NPH Human  Humulin N Novolin N 100 unit/ ML, and Lisinopril 5 mg. Pt states needs ASAP does not have any of these meds left. Please advise.

## 2016-01-09 NOTE — Telephone Encounter (Signed)
Rx's sent to the pharmacy by e-script.//AB/CMA 

## 2016-01-09 NOTE — Progress Notes (Signed)
   Subjective:    Patient ID: Steve [name], male    DOB: [birth year], 55 y.o.   MRN: [id number]  HPI  55 year old male presents the also concerns of right heel pain which is been ongoing for several months. He has previously been seeing Designer, fashion/clothing. He states he had 3 injections to the area as well as inserts, night splint, cam boot as well as bracing. He is also been stretching home. He does continue to have pain. He describes a sharp pain to the bottom of his heel which hurts in the morning when he first gets up after walking and standing all day at work. He denies any other areas of numbness or tingling to his feet. Denies any recent injury or trauma. Denies any claudication symptoms. The pain does not wake up at night. No other complaints at this time.   Review of Systems  All other systems reviewed and are negative.      Objective:   Physical Exam General: AAO x3, NAD  Dermatological: Skin is warm, dry and supple bilateral. Nails x 10 are well manicured; remaining integument appears unremarkable at this time. There are no open sores, no preulcerative lesions, no rash or signs of infection present.  Vascular: Dorsalis Pedis artery and Posterior Tibial artery pedal pulses are 2/4 bilateral with immedate capillary fill time. Pedal hair growth present. No varicosities and no lower extremity edema present bilateral. There is no pain with calf compression, swelling, warmth, erythema.   Neruologic: Grossly intact via light touch bilateral. Vibratory intact via tuning fork bilateral. Protective threshold with Semmes Wienstein monofilament intact to all pedal sites bilateral. Tinel sign negative.  Musculoskeletal: Tenderness to palpation along the plantar medial tubercle of the calcaneus at the insertion of plantar fascia on the right foot. There is no pain along the course of the plantar fascia within the arch of the foot. Plantar fascia appears to be intact. There is no pain with lateral  compression of the calcaneus or pain with vibratory sensation. There is no pain along the course or insertion of the achilles tendon. No other areas of tenderness to bilateral lower extremities. MMT 5/5, ROM WNL. Hammertoe contractures are present bilaterally. Gait: Unassisted, Nonantalgic.      Assessment & Plan:  55 year old male with right heel pain, likely plantar fasciitis -Treatment options discussed including all alternatives, risks, and complications -Etiology of symptoms were discussed -X-rays were ordered and reviewed today. -At this time he is attended multiple conservative treatments without any resolution of symptoms. Another doctor was top and antibiotic Topaz fasciotomy. However his A1c currently is 10. I recommended hold off any surgical intervention at this time. -I discussed with him physical therapy and EPAT. At this point we will start with physical therapy. This was ordered today. -A to what anti-inflammatories due to cardiac issues and steroids given his elevated A1c. -Follow-up in 4-6 weeks or after physical therapy. Call any questions or concerns in the meantime.  Celesta Gentile, DPM

## 2016-01-10 ENCOUNTER — Encounter: Payer: Self-pay | Admitting: Podiatry

## 2016-01-10 DIAGNOSIS — M2040 Other hammer toe(s) (acquired), unspecified foot: Secondary | ICD-10-CM | POA: Insufficient documentation

## 2016-01-10 DIAGNOSIS — M722 Plantar fascial fibromatosis: Secondary | ICD-10-CM | POA: Insufficient documentation

## 2016-01-15 ENCOUNTER — Ambulatory Visit (INDEPENDENT_AMBULATORY_CARE_PROVIDER_SITE_OTHER): Payer: BLUE CROSS/BLUE SHIELD | Admitting: Physician Assistant

## 2016-01-15 ENCOUNTER — Encounter: Payer: Self-pay | Admitting: Physician Assistant

## 2016-01-15 VITALS — BP 120/70 | HR 79 | Temp 97.8°F | Ht 76.0 in | Wt 276.6 lb

## 2016-01-15 DIAGNOSIS — E1140 Type 2 diabetes mellitus with diabetic neuropathy, unspecified: Secondary | ICD-10-CM

## 2016-01-15 DIAGNOSIS — R0789 Other chest pain: Secondary | ICD-10-CM | POA: Diagnosis not present

## 2016-01-15 MED ORDER — TRAMADOL HCL 50 MG PO TABS: 50.0000 mg | ORAL_TABLET | Freq: Two times a day (BID) | ORAL | Status: DC | PRN

## 2016-01-15 NOTE — Progress Notes (Signed)
Patient presents to clinic today for follow-up of Diabetes Mellitus previously uncontrolled with history of peripheral neuropathy. Has previously been noncompliant with medications. Endorses taking Humulin N at 40 units AM and PM. Is taking Humalog very rarely at meal times. Has started the Onglyza as directed. Continues Metformin as directed.  Is checking fasting sugar levels. Endorses averaging 120-160. A1C last checked on 12/18/15 at 10. Patient is on ACEI.  Patient endorses left upper chest and shoulder pain with ROM or left arm over the past couple of weeks. Endorses pain is intermittent usually but yesterday was constant and described as tight and pulling. Denies SOB, palpitations, lightheadedness. Has taken OTC medications with some relief in symptoms. Denies trauma or injury but does do heavy lifting.  Past Medical History  Diagnosis Date  . Diabetes mellitus without complication ([place])   . Hypertension   . Hyperlipidemia   . Complication of anesthesia     has night terrors after anesthesia but can be violent when waking up  . 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 Pt stated "I forget alot of things"  . Frequent urination at night     when blood sugar runs high  . Psoriasis of scalp     nose and face; occurs mainly in winter   . Seasonal allergies   . CAD (coronary artery disease)   . Sinus infection 12/16/13  . ADHD (attention deficit hyperactivity disorder)   . Allergy   . Depression   . Anxiety     Current Outpatient Prescriptions on File Prior to Visit  Medication Sig Dispense Refill  . aspirin EC 325 MG tablet Take 1 tablet (325 mg total) by mouth daily. 30 tablet 0  . atorvastatin (LIPITOR) 10 MG tablet Take 1 tablet (10 mg total) by mouth daily. 30 tablet 5  . gabapentin (NEURONTIN) 300 MG capsule Take 600 mg each AM, 300 mg noon and 600 mg PM. 450 capsule 1  . insulin lispro (HUMALOG KWIKPEN) 100 UNIT/ML KiwkPen Inject 0-12 Units into the skin 3 (three)  times daily with meals. sliding scale- check sugar and inject 3 times daily before meals as below: <150- Zero units 150-200 2 units 201-250 4 units 251-300 6 units 301-350 8 units 351-400 10 units >400 12 units and contact us. 15 mL 11  . insulin NPH Human (HUMULIN N,NOVOLIN N) 100 UNIT/ML injection 30 units in the morning and 30-40 units at bedtime. 10 mL 2  . lisinopril (PRINIVIL,ZESTRIL) 5 MG tablet Take 1 tablet (5 mg total) by mouth daily. 30 tablet 6  . metFORMIN (GLUCOPHAGE) 1000 MG tablet Take 1 tablet (1,000 mg total) by mouth 2 (two) times daily with a meal. No further refills until follow-up. Schedule a visit. 180 tablet 0  . Multiple Vitamins-Minerals (ZINC PO) Take 1 tablet by mouth daily.     . saxagliptin HCl (ONGLYZA) 5 MG TABS tablet Take 1 tablet (5 mg total) by mouth daily. 30 tablet 1   No current facility-administered medications on file prior to visit.    Allergies  Allergen Reactions  . Sulfa Antibiotics Itching and Other (See Comments)    Burning also    Family History  Problem Relation Age of Onset  . Diabetes Mother   . Cancer Mother     history breast cancer  . Hypertension Mother   . Cancer Father     prostate  . Alzheimer's disease Father   . Diabetes Father   .  Hypertension Father   . Cancer Maternal Uncle     colon  . Colon cancer Maternal Uncle 9  . Cancer Maternal Grandmother     breast  . Cancer Maternal Grandfather     prostate  . Cancer Maternal Uncle     prostate  . Colon cancer Maternal Uncle 63  . Cancer Maternal Uncle     prostate  . Cancer Cousin 47    metastatic colon cancer?  . Colon polyps Paternal Uncle   . Esophageal cancer Neg Hx   . Stomach cancer Neg Hx   . Rectal cancer Neg Hx     Social History   Social History  . Marital Status: Married    Spouse Name: N/A  . Number of Children: 6  . Years of Education: N/A   Social History Main Topics  . Smoking status: Never Smoker   . Smokeless tobacco: Never Used  .  Alcohol Use: 0.6 - 1.2 oz/week    1-2 Cans of beer per week     Comment: rare beer  . Drug Use: No  . Sexual Activity:    Partners: Female   Other Topics Concern  . None   Social History Narrative   Regular exercise: very little   Caffeine use: sweet tea daily   6 children   Works in Architect, owns concession.   Married   Enjoys televition.     Review of Systems - See HPI.  All other ROS are negative.  BP 120/70 mmHg  Pulse 79  Temp(Src) 97.8 F (36.6 C) (Oral)  Ht '6\' 4"'$  (1.93 m)  Wt 276 lb 9.6 oz (125.465 kg)  BMI 33.68 kg/m2  SpO2 97%  Physical Exam  Constitutional: He is oriented to person, place, and time and well-developed, well-nourished, and in no distress.  HENT:  Head: Normocephalic and atraumatic.  Eyes: Conjunctivae are normal.  Neck: Neck supple.  Cardiovascular: Normal rate, regular rhythm, normal heart sounds and intact distal pulses.   Pulmonary/Chest: Effort normal and breath sounds normal. No respiratory distress. He has no wheezes. He has no rales. He exhibits tenderness.    Neurological: He is alert and oriented to person, place, and time.  Skin: Skin is warm and dry. No rash noted.  Psychiatric: Affect normal.  Vitals reviewed.   Recent Results (from the past 2160 hour(s))  Comp Met (CMET)     Status: Abnormal   Collection Time: 12/18/15  9:59 AM  Result Value Ref Range   Sodium 138 135 - 145 mEq/L   Potassium 4.0 3.5 - 5.1 mEq/L   Chloride 101 96 - 112 mEq/L   CO2 25 19 - 32 mEq/L   Glucose, Bld 264 (H) 70 - 99 mg/dL   BUN 9 6 - 23 mg/dL   Creatinine, Ser 0.85 0.40 - 1.50 mg/dL   Total Bilirubin 0.5 0.2 - 1.2 mg/dL   Alkaline Phosphatase 68 39 - 117 U/L   AST 22 0 - 37 U/L   ALT 31 0 - 53 U/L   Total Protein 7.0 6.0 - 8.3 g/dL   Albumin 4.4 3.5 - 5.2 g/dL   Calcium 9.8 8.4 - 10.5 mg/dL   GFR 100.33 >60.00 mL/min  Hemoglobin A1c     Status: Abnormal   Collection Time: 12/18/15  9:59 AM  Result Value Ref Range   Hgb A1c MFr Bld  10.0 (H) 4.6 - 6.5 %    Comment: Glycemic Control Guidelines for People with Diabetes:Non Diabetic:  <6%Goal of  Therapy: <7%Additional Action Suggested:  >8%   Lipid Profile     Status: Abnormal   Collection Time: 12/18/15  9:59 AM  Result Value Ref Range   Cholesterol 161 0 - 200 mg/dL    Comment: ATP III Classification       Desirable:  < 200 mg/dL               Borderline High:  200 - 239 mg/dL          High:  > = 240 mg/dL   Triglycerides 342.0 (H) 0.0 - 149.0 mg/dL    Comment: Normal:  <150 mg/dLBorderline High:  150 - 199 mg/dL   HDL 37.50 (L) >39.00 mg/dL   VLDL 68.4 (H) 0.0 - 40.0 mg/dL   Total CHOL/HDL Ratio 4     Comment:                Men          Women1/2 Average Risk     3.4          3.3Average Risk          5.0          4.42X Average Risk          9.6          7.13X Average Risk          15.0          11.0                       NonHDL 123.63     Comment: NOTE:  Non-HDL goal should be 30 mg/dL higher than patient's LDL goal (i.e. LDL goal of < 70 mg/dL, would have non-HDL goal of < 100 mg/dL)  LDL cholesterol, direct     Status: None   Collection Time: 12/18/15  9:59 AM  Result Value Ref Range   Direct LDL 59.0 mg/dL    Comment: Optimal:  <100 mg/dLNear or Above Optimal:  100-129 mg/dLBorderline High:  130-159 mg/dLHigh:  160-189 mg/dLVery High:  >190 mg/dL    Assessment/Plan: Atypical chest pain EKG obtained due to cardiac risk factors (DM, HLD). EKG with NSR. Symptoms and exam consistent with MSK cause of symptoms. Suspect pectoral strain. Rx Tramadol. Supportive measures reviewed. Alarm signs/symptoms discussed that would prompt ER evaluation. Will see him at follow-up.  DM neuropathy, type II diabetes mellitus Is becoming compliant with medications. Fasting sugars above goal. Will increase nighttime long-acting insulin by 2 units every 3-4 evenings until fasting sugars at goal of 100-130. Follow-up 3 months.

## 2016-01-15 NOTE — Progress Notes (Signed)
Pre visit review using our clinic review tool, if applicable. No additional management support is needed unless otherwise documented below in the visit note. 

## 2016-01-15 NOTE — Assessment & Plan Note (Signed)
Is becoming compliant with medications. Fasting sugars above goal. Will increase nighttime long-acting insulin by 2 units every 3-4 evenings until fasting sugars at goal of 100-130. Follow-up 3 months.

## 2016-01-15 NOTE — Assessment & Plan Note (Signed)
EKG obtained due to cardiac risk factors (DM, HLD). EKG with NSR. Symptoms and exam consistent with MSK cause of symptoms. Suspect pectoral strain. Rx Tramadol. Supportive measures reviewed. Alarm signs/symptoms discussed that would prompt ER evaluation. Will see him at follow-up.

## 2016-01-15 NOTE — Patient Instructions (Addendum)
Please increase evening insulin by 2 units every 3-4 evenings until fasting sugars are averaging 100-130. Continue morning insulin, meal time insulin and oral medications as directed.  Your EKG looks good. Pain seems consistent with deep muscular strain. Take Tramadol as directed (This will help foot as well). Continue BP medications. Avoid heavy lifting or overexertion.  Follow-up if symptoms are not resolving. If you notice any change in pain, shortness of breath or racing heart, please call 911 or have someone carry you to the ER.  Otherwise follow-up with me in 3 months for diabetes.

## 2016-01-28 ENCOUNTER — Ambulatory Visit: Payer: BLUE CROSS/BLUE SHIELD | Admitting: Physician Assistant

## 2016-01-28 ENCOUNTER — Ambulatory Visit: Payer: BLUE CROSS/BLUE SHIELD | Attending: Podiatry | Admitting: Physical Therapy

## 2016-01-30 ENCOUNTER — Ambulatory Visit (INDEPENDENT_AMBULATORY_CARE_PROVIDER_SITE_OTHER): Payer: BLUE CROSS/BLUE SHIELD | Admitting: Rehabilitation

## 2016-01-30 ENCOUNTER — Encounter: Payer: Self-pay | Admitting: Rehabilitation

## 2016-01-30 DIAGNOSIS — M6281 Muscle weakness (generalized): Secondary | ICD-10-CM | POA: Diagnosis not present

## 2016-01-30 DIAGNOSIS — M79671 Pain in right foot: Secondary | ICD-10-CM

## 2016-01-30 DIAGNOSIS — M25671 Stiffness of right ankle, not elsewhere classified: Secondary | ICD-10-CM | POA: Diagnosis not present

## 2016-01-30 NOTE — Therapy (Signed)
[place], [place], [address] Phone: [phone]   Fax:  [phone]  Physical Therapy Evaluation  Patient Details  Name: Steve [name] MRN: [id number] Date of Birth: December 24, 1960 Referring Provider: Dr. Jacqualyn Posey  Encounter Date: 01/30/2016      PT End of Session - 01/30/16 1113    Visit Number 1   Number of Visits 12   Date for PT Re-Evaluation 03/12/16   PT Start Time 1008   PT Stop Time 1059   PT Time Calculation (min) 51 min   Activity Tolerance Patient tolerated treatment well      Past Medical History  Diagnosis Date  . Diabetes mellitus without complication ([place])   . Hypertension   . Hyperlipidemia   . Complication of anesthesia     has night terrors after anesthesia but can be violent when waking up  . 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 Pt stated "I forget alot of things"  . Frequent urination at night     when blood sugar runs high  . Psoriasis of scalp     nose and face; occurs mainly in winter   . Seasonal allergies   . CAD (coronary artery disease)   . Sinus infection 12/16/13  . ADHD (attention deficit hyperactivity disorder)   . Allergy   . Depression   . Anxiety     Past Surgical History  Procedure Laterality Date  . Hernia repair  2010    abdominal hernia  . Knee arthroscopy  2012    left knee  . Cardiac catheterization      no PCI approx 10 years ago - [place] surgery      right ring finger  . Knee surgery    . Total knee arthroplasty Left 12/28/2013    Procedure: TOTAL KNEE ARTHROPLASTY;  Surgeon: Ninetta Lights, MD;  Location: [facility];  Service: Orthopedics;  Laterality: Left;  . Nasal septoplasty w/ turbinoplasty  09/19/2015    Procedure: NASAL SEPTOPLASTY WITH TURBINATE REDUCTION;  Surgeon: Jodi Marble, MD;  Location: [facility];  Service: ENT;;    There were no vitals filed for this visit.       Subjective Assessment -  01/30/16 1008    Subjective presents with plantar fasciitis on the R foot x 5-6 years.  At that time he had an injection and medication and it stopped hurting.  started hurting again last year.  Has recently had 3 injections, new insoles, an ASO brace, an air cast, and nothing has helped.  not working the past 6 months.     Pertinent History L TKR 2 years ago, DM, HTN   Limitations Standing;Walking   How long can you walk comfortably? hurts immediately   Diagnostic tests xrays normal per patient   Patient Stated Goals decrease pain   Currently in Pain? Yes   Pain Score 1   up to 8/10 when standing on it   Pain Location Heel   Pain Orientation Right   Pain Descriptors / Indicators Aching;Burning;Constant   Pain Type Acute pain   Pain Onset More than a month ago   Pain Frequency Constant   Aggravating Factors  walking, standing   Pain Relieving Factors rest, ice;            [facility] PT Assessment - 01/30/16 0001    Assessment   Medical Diagnosis R plantar fasciitis   Referring Provider  Dr. Jacqualyn Posey   Onset Date/Surgical Date 07/13/16   Next MD Visit 4-6 weeks   Prior Therapy no   Precautions   Precautions None   Restrictions   Weight Bearing Restrictions No   Balance Screen   Has the patient fallen in the past 6 months No   Prior Function   Vocation Full time employment   Vocation Requirements standing and walking; will go back when foot feels better   Observation/Other Assessments   Focus on Therapeutic Outcomes (FOTO)  49%   Sensation   Light Touch Appears Intact   Functional Tests   Functional tests Squat;Single Leg Squat;Single leg stance   Squat   Comments WNL   Single Leg Squat   Comments increased genu varum bilaterally but WNL   Single Leg Stance   Comments WNL bil with increased pain   Posture/Postural Control   Posture Comments decreased stance on R LE due to pain   ROM / Strength   AROM / PROM / Strength Strength;AROM;PROM   AROM   Overall AROM Comments R  knee 0-130, L: 0-117   PROM   Overall PROM Comments ankle DF knee straight R: 1 L: 5   Strength   Overall Strength Comments ankle all 5/5bil   Strength Assessment Site Hip;Knee   Right/Left Hip Right;Left   Right Hip Flexion 5/5   Right Hip Extension 5/5   Right Hip External Rotation  5/5   Right Hip Internal Rotation 5/5   Right Hip ABduction 5/5   Left Hip Flexion 5/5   Left Hip Extension 5/5   Left Hip External Rotation 5/5   Left Hip Internal Rotation 5/5   Left Hip ABduction 4-/5   Right/Left Knee Right;Left   Right Knee Flexion 5/5   Right Knee Extension 5/5   Left Knee Flexion 4/5   Left Knee Extension 4/5   Right/Left Ankle Right;Left   Flexibility   Soft Tissue Assessment /Muscle Length yes   Hamstrings R to 80; L to 50   Quadriceps L 20deg from glute; R 6deg   Palpation   Palpation comment +3 ttp calcaneal heel pad; +2 ttp arch at attachment to calcaneus   Ambulation/Gait   Gait Comments antalgic R stance            Reviewed and performed HEP x 2 each per HEP instruction section       [facility] Adult PT Treatment/Exercise - 01/30/16 0001    Modalities   Modalities Ultrasound   Ultrasound   Ultrasound Location R heel   Ultrasound Parameters 50%, 1.5w/cm2, 1MHz   Ultrasound Goals Pain                PT Education - 01/30/16 1112    Education provided Yes   Education Details HEP, use of [place], POC   Person(s) Educated Patient   Methods Explanation;Demonstration;Verbal cues;Handout   Comprehension Verbalized understanding;Returned demonstration             PT Long Term Goals - 01/30/16 1118    PT LONG TERM GOAL #1   Title pt will decrease pain to 4/10 or less with walking in the home   Time 6   Period Weeks   Status New   PT LONG TERM GOAL #2   Title pt will improve L hip abductor strength to 4+/5 or greater to improve proximal stability   Time 6   Period Weeks   Status New   PT LONG TERM GOAL #3   Title  pt will ambulate with equal  stance time between LEs due to decreased pain   Time 6   Period Weeks               Plan - 01/30/16 1115    Clinical Impression Statement Pt presents with return of R heel pain/plantar fasciitis most likley exacerbated by L TKR 2 years ago and no full return of strength at the knee.  Also contributions from L hip abductor weakness and tightness in the R ankle and calf.     Rehab Potential Good   PT Frequency 2x / week   PT Duration 6 weeks   PT Treatment/Interventions Cryotherapy;Electrical Stimulation;Iontophoresis 4mg /ml Dexamethasone;Moist Heat;Ultrasound;Therapeutic activities;Therapeutic exercise;Neuromuscular re-education;Patient/family education;Manual techniques;Passive range of motion;Taping   PT Next Visit Plan begin modalities, review HEP, plantar fasciitis POC (incorporate strengthening of the L knee and hip)   Consulted and Agree with Plan of Care Patient      Patient will benefit from skilled therapeutic intervention in order to improve the following deficits and impairments:  Abnormal gait, Decreased activity tolerance, Decreased mobility, Decreased strength, Decreased range of motion, Difficulty walking, Impaired flexibility, Pain  Visit Diagnosis: Heel pain, right  Muscle weakness (generalized)  Stiffness of right ankle, not elsewhere classified     Problem List Patient Active Problem List   Diagnosis Date Noted  . Plantar fasciitis 01/10/2016  . Hammertoe 01/10/2016  . Deviated nasal septum 09/19/2015  . Abdominal pain 07/26/2015  . Mass of leg 06/18/2015  . Numbness of left foot 03/23/2015  . Cellulitis of leg, left 08/01/2014  . Accessory skin tags 08/01/2014  . Hypogonadism in male 07/24/2014  . Essential hypertension, benign 07/24/2014  . Sinusitis, chronic 07/24/2014  . Post herpetic neuralgia 05/05/2014  . Skin lesion 04/09/2014  . Gout 12/06/2013  . Allergic rhinitis 09/07/2013  . Atypical chest pain 07/15/2013  . Left ankle pain  06/14/2013  . Rib injury 06/14/2013  . Pain in left ankle w/ effusion 05/24/2013  . Routine general medical examination at a health care facility 04/11/2013  . DM neuropathy, type II diabetes mellitus ([place]) 07/26/2012  . Hyperlipidemia 07/26/2012  . 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 ED (erectile dysfunction) 07/26/2012    Stark Bray, DPT, CMP 01/30/2016, 11:22 AM  [facility] [place], [place], [address] Phone: [phone]   Fax:  [phone]  Name: Steve [name] MRN: [id number] Date of Birth: Nov 21, 1960

## 2016-02-01 ENCOUNTER — Encounter: Payer: BLUE CROSS/BLUE SHIELD | Admitting: Physical Therapy

## 2016-02-06 ENCOUNTER — Ambulatory Visit: Payer: BLUE CROSS/BLUE SHIELD | Admitting: Podiatry

## 2016-02-19 ENCOUNTER — Ambulatory Visit (INDEPENDENT_AMBULATORY_CARE_PROVIDER_SITE_OTHER): Payer: BLUE CROSS/BLUE SHIELD | Admitting: Physical Therapy

## 2016-02-19 DIAGNOSIS — M79671 Pain in right foot: Secondary | ICD-10-CM | POA: Diagnosis not present

## 2016-02-19 DIAGNOSIS — M6281 Muscle weakness (generalized): Secondary | ICD-10-CM | POA: Diagnosis not present

## 2016-02-19 DIAGNOSIS — M25671 Stiffness of right ankle, not elsewhere classified: Secondary | ICD-10-CM

## 2016-02-19 NOTE — Therapy (Signed)
[facility] [place] [address] [place], [place], [address] Phone: [phone]   Fax:  [phone]  Physical Therapy Treatment  Patient Details  Name: Steve [name] MRN: [id number] Date of Birth: Feb 15, 1961 Referring Provider: Dr. Ardelle Anton   Encounter Date: 02/19/2016      PT End of Session - 02/19/16 1024    Visit Number 2   Number of Visits 12   Date for PT Re-Evaluation 03/12/16   PT Start Time 1017   PT Stop Time 1104   PT Time Calculation (min) 47 min      Past Medical History  Diagnosis Date  . Diabetes mellitus without complication ([facility])   . Hypertension   . Hyperlipidemia   . Complication of anesthesia     has night terrors after anesthesia but can be violent when waking up  . 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 Pt stated "I forget alot of things"  . Frequent urination at night     when blood sugar runs high  . Psoriasis of scalp     nose and face; occurs mainly in winter   . Seasonal allergies   . CAD (coronary artery disease)   . Sinus infection 12/16/13  . ADHD (attention deficit hyperactivity disorder)   . Allergy   . Depression   . Anxiety     Past Surgical History  Procedure Laterality Date  . Hernia repair  2010    abdominal hernia  . Knee arthroscopy  2012    left knee  . Cardiac catheterization      no PCI approx 10 years ago [facility]  . Finger surgery      right ring finger  . Knee surgery    . Total knee arthroplasty Left 12/28/2013    Procedure: TOTAL KNEE ARTHROPLASTY;  Surgeon: Loreta Ave, MD;  Location: [facility] OR;  Service: Orthopedics;  Laterality: Left;  . Nasal septoplasty w/ turbinoplasty  09/19/2015    Procedure: NASAL SEPTOPLASTY WITH TURBINATE REDUCTION;  Surgeon: Flo Shanks, MD;  Location: [facility] OR;  Service: ENT;;    There were no vitals filed for this visit.      Subjective Assessment - 02/19/16 1024    Subjective Pt reports he has been out of  town working.  Has been doing HEP each day.  Started a gluten free diet 2 wks ago to see if it would decrease the inflammation in body.  No new changes since last visit.    Currently in Pain? Yes   Pain Score 2   at rest, NWB, and up to 9/10 with WB   Pain Location Heel   Pain Orientation Right   Pain Descriptors / Indicators Aching;Burning;Stabbing   Aggravating Factors  walking, standing   Pain Relieving Factors rest, ice            [facility] PT Assessment - 02/19/16 0001    Assessment   Medical Diagnosis R plantar fasciitis   Referring Provider Dr. Ardelle Anton    Onset Date/Surgical Date 07/13/16   Next MD Visit not scheduled yet.          [facility] Adult PT Treatment/Exercise - 02/19/16 0001    Exercises   Exercises --   Modalities   Modalities Iontophoresis;Ultrasound   Ultrasound   Ultrasound Location Rt heel    Ultrasound Parameters 50%, 1.0 w/cm2, 1.0 mHz, 8 min    Ultrasound Goals Pain   Iontophoresis   Type  of Iontophoresis Dexamethasone   Location Rt heel   Dose 1.0 cc   Time 6 hr patch   Ankle Exercises: Stretches   Plantar Fascia Stretch 2 reps;20 seconds   Soleus Stretch 2 reps;30 seconds   Gastroc Stretch 2 reps;30 seconds   Other Stretch Seated hamstring stretch Rt/Lt 30 sec x 2 reps   Other Stretch prone quad stretch x 30 sec x 2    Ankle Exercises: Aerobic   Stationary Bike NuStep L5: 6 min    Ankle Exercises: Seated   Other Seated Ankle Exercises Seated clamshells with blue band at thigh x 10 reps x 2 sets;  seated marching with blue band x 20 steps                      PT Long Term Goals - 02/19/16 1046    PT LONG TERM GOAL #1   Title pt will decrease pain to 4/10 or less with walking in the home   Time 6   Period Weeks   Status On-going   PT LONG TERM GOAL #2   Title pt will improve L hip abductor strength to 4+/5 or greater to improve proximal stability   Time 6   Period Weeks   Status On-going   PT LONG TERM GOAL #3   Title pt  will ambulate with equal stance time between LEs due to decreased pain   Time 6   Period Weeks   Status On-going               Plan - 02/19/16 1306    Clinical Impression Statement Pt tolerated all exercises well.  Some modifications made to exercises so that he can perform all of the exercises daily.  Pt reported decreased pain at end of session.  No goals met yet, only 2nd visit.    Rehab Potential Good   PT Frequency 2x / week   PT Duration 6 weeks   PT Treatment/Interventions Cryotherapy;Electrical Stimulation;Iontophoresis '4mg'$ /ml Dexamethasone;Moist Heat;Ultrasound;Therapeutic activities;Therapeutic exercise;Neuromuscular re-education;Patient/family education;Manual techniques;Passive range of motion;Taping   PT Next Visit Plan assess response to ionto, review HEP, plantar fasciitis POC (incorporate strengthening of the L knee and hip)   Consulted and Agree with Plan of Care Patient      Patient will benefit from skilled therapeutic intervention in order to improve the following deficits and impairments:  Abnormal gait, Decreased activity tolerance, Decreased mobility, Decreased strength, Decreased range of motion, Difficulty walking, Impaired flexibility, Pain  Visit Diagnosis: Heel pain, right  Muscle weakness (generalized)  Stiffness of right ankle, not elsewhere classified     Problem List Patient Active Problem List   Diagnosis Date Noted  . Plantar fasciitis 01/10/2016  . Hammertoe 01/10/2016  . Deviated nasal septum 09/19/2015  . Abdominal pain 07/26/2015  . Mass of leg 06/18/2015  . Numbness of left foot 03/23/2015  . Cellulitis of leg, left 08/01/2014  . Accessory skin tags 08/01/2014  . Hypogonadism in male 07/24/2014  . Essential hypertension, benign 07/24/2014  . Sinusitis, chronic 07/24/2014  . Post herpetic neuralgia 05/05/2014  . Skin lesion 04/09/2014  . Gout 12/06/2013  . Allergic rhinitis 09/07/2013  . Atypical chest pain 07/15/2013  .  Left ankle pain 06/14/2013  . Rib injury 06/14/2013  . Pain in left ankle w/ effusion 05/24/2013  . Routine general medical examination at a health care facility 04/11/2013  . DM neuropathy, type II diabetes mellitus ([place]) 07/26/2012  . Hyperlipidemia 07/26/2012  . DJD (degenerative  joint disease) of knee 07/26/2012  . ED (erectile dysfunction) 07/26/2012   Kerin Perna, PTA 02/19/2016 1:09 PM  [place] [facility] [place], [place], [address] Phone: [phone]   Fax:  [phone]  Name: DRAYLEN LOBUE MRN: [id number] Date of Birth: 1961/09/07

## 2016-02-21 ENCOUNTER — Ambulatory Visit (INDEPENDENT_AMBULATORY_CARE_PROVIDER_SITE_OTHER): Payer: BLUE CROSS/BLUE SHIELD | Admitting: Physical Therapy

## 2016-02-21 DIAGNOSIS — M25671 Stiffness of right ankle, not elsewhere classified: Secondary | ICD-10-CM | POA: Diagnosis not present

## 2016-02-21 DIAGNOSIS — M6281 Muscle weakness (generalized): Secondary | ICD-10-CM | POA: Diagnosis not present

## 2016-02-21 DIAGNOSIS — M79671 Pain in right foot: Secondary | ICD-10-CM

## 2016-02-21 NOTE — Patient Instructions (Addendum)
Ankle Circle (Ankle Mobility / Flexibility)  First thing in the morning     Sit facing forward. Extend one leg forward, foot a few inches above floor, with toes pointed (or resting on the floor). Rotate ankle, making circle with foot, while breathing in and out slowly. Repeat _10-20__ times in each direction. Repeat with other foot. Do _2__ sessions per day.  Copyright  VHI. All rights reserved.   Forefoot Evertors    Place right foot flat on towel, knee pointed forward. Use forefoot and toes to push towel out to side. Do not allow heel or knee to move. Hold _1___ seconds. Repeat __5__ times. Do _1___ sessions per day. CAUTION: Repetitions should be slow and controlled.  Forefoot Invertors    Place right foot flat on towel, knee pointed forward. Use forefoot and toes to pull towel in toward center. Do not allow heel or knee to move. Hold __1__ seconds. Repeat __5__ times (length of towel). Do __1__ sessions per day. CAUTION: Repetitions should be slow and controlled.   [facility] at [facility] [place], [place] [address]  [phone] (office) [phone] (fax)

## 2016-02-21 NOTE — Therapy (Signed)
[place], [place], [address] Phone: [phone]   Fax:  [phone]  Physical Therapy Treatment  Patient Details  Name: Steve [name] MRN: [id number] Date of Birth: 04-06-1961 Referring Provider: Dr. Jacqualyn Posey   Encounter Date: 02/21/2016      PT End of Session - 02/21/16 1107    Visit Number 3   Number of Visits 12   Date for PT Re-Evaluation 03/12/16   PT Start Time 1020   PT Stop Time 1107   PT Time Calculation (min) 47 min   Activity Tolerance Patient tolerated treatment well;No increased pain      Past Medical History  Diagnosis Date  . Diabetes mellitus without complication ([place])   . Hypertension   . Hyperlipidemia   . Complication of anesthesia     has night terrors after anesthesia but can be violent when waking up  . 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 Pt stated "I forget alot of things"  . Frequent urination at night     when blood sugar runs high  . Psoriasis of scalp     nose and face; occurs mainly in winter   . Seasonal allergies   . CAD (coronary artery disease)   . Sinus infection 12/16/13  . ADHD (attention deficit hyperactivity disorder)   . Allergy   . Depression   . Anxiety     Past Surgical History  Procedure Laterality Date  . Hernia repair  2010    abdominal hernia  . Knee arthroscopy  2012    left knee  . Cardiac catheterization      no PCI approx 10 years ago - [place] surgery      right ring finger  . Knee surgery    . Total knee arthroplasty Left 12/28/2013    Procedure: TOTAL KNEE ARTHROPLASTY;  Surgeon: Ninetta Lights, MD;  Location: [facility];  Service: Orthopedics;  Laterality: Left;  . Nasal septoplasty w/ turbinoplasty  09/19/2015    Procedure: NASAL SEPTOPLASTY WITH TURBINATE REDUCTION;  Surgeon: Jodi Marble, MD;  Location: [facility];  Service: ENT;;    There were no vitals filed for this visit.      Subjective  Assessment - 02/21/16 1036    Subjective Pt reports he has had a lot of relief since last visit.  Has performed the HEP each day, with the modifications we made. He rolled his right ankle yesterday while edging his lawn, didn't feel swollen until this morning. Ambulates with antalgic gait, ankle brace on RLE.    Currently in Pain? Yes   Pain Score 1   with or without weight bearing    Pain Location Heel   Pain Orientation Right   Pain Descriptors / Indicators Burning   Multiple Pain Sites Yes   Pain Score 4   Pain Location Ankle   Pain Orientation Right   Pain Descriptors / Indicators Sharp   Aggravating Factors  walking    Pain Relieving Factors rest          [facility] Adult PT Treatment/Exercise - 02/21/16 0001    Ultrasound   Ultrasound Location Rt heel   Ultrasound Parameters 50%.1.10mHz, 1.1 w/cm, 72min   Ultrasound Goals Pain   Iontophoresis   Type of Iontophoresis Dexamethasone   Location Rt heel   Dose 1.0 cc   Time 6 hr patch   Ankle Exercises: Seated   Towel Crunch  5 reps   Towel Inversion/Eversion 4 reps   BAPS Level 3;Sitting;10 reps  CW/CCW, PF/DF, inversion/eversion   Other Seated Ankle Exercises Seated clamshells with black band at thigh x 10 reps x 2 sets;  seated marching with black band x 20 steps    Ankle Exercises: Stretches   Plantar Fascia Stretch 2 reps;20 seconds   Soleus Stretch 2 reps;30 seconds   Gastroc Stretch 2 reps;30 seconds   Other Stretch Seated hamstring stretch Rt/Lt 30 sec x 2 reps   Other Stretch prone quad stretch x 30 sec x 2    Ankle Exercises: Aerobic   Stationary Bike NuStep L5: 6 min             PT Long Term Goals - 02/19/16 1046    PT LONG TERM GOAL #1   Title pt will decrease pain to 4/10 or less with walking in the home   Time 6   Period Weeks   Status On-going   PT LONG TERM GOAL #2   Title pt will improve L hip abductor strength to 4+/5 or greater to improve proximal stability   Time 6   Period Weeks   Status  On-going   PT LONG TERM GOAL #3   Title pt will ambulate with equal stance time between LEs due to decreased pain   Time 6   Period Weeks   Status On-going               Plan - 02/21/16 1054    Clinical Impression Statement Pt had positive response to last session, noting reduced pain in heel.  Now having ankle pain due to recent accident, affecting gait and pain level.  Pt tolerated all exercises without increase in pain. Progressing towards goals.    Rehab Potential Good   PT Frequency 2x / week   PT Duration 6 weeks   PT Treatment/Interventions Cryotherapy;Electrical Stimulation;Iontophoresis 4mg /ml Dexamethasone;Moist Heat;Ultrasound;Therapeutic activities;Therapeutic exercise;Neuromuscular re-education;Patient/family education;Manual techniques;Passive range of motion;Taping   PT Next Visit Plan continue progressive stretching/strengthening RLE; modalities as indicated.    Consulted and Agree with Plan of Care Patient      Patient will benefit from skilled therapeutic intervention in order to improve the following deficits and impairments:  Abnormal gait, Decreased activity tolerance, Decreased mobility, Decreased strength, Decreased range of motion, Difficulty walking, Impaired flexibility, Pain  Visit Diagnosis: Heel pain, right  Muscle weakness (generalized)  Stiffness of right ankle, not elsewhere classified     Problem List Patient Active Problem List   Diagnosis Date Noted  . Plantar fasciitis 01/10/2016  . Hammertoe 01/10/2016  . Deviated nasal septum 09/19/2015  . Abdominal pain 07/26/2015  . Mass of leg 06/18/2015  . Numbness of left foot 03/23/2015  . Cellulitis of leg, left 08/01/2014  . Accessory skin tags 08/01/2014  . Hypogonadism in male 07/24/2014  . Essential hypertension, benign 07/24/2014  . Sinusitis, chronic 07/24/2014  . Post herpetic neuralgia 05/05/2014  . Skin lesion 04/09/2014  . Gout 12/06/2013  . Allergic rhinitis 09/07/2013  .  Atypical chest pain 07/15/2013  . Left ankle pain 06/14/2013  . Rib injury 06/14/2013  . Pain in left ankle w/ effusion 05/24/2013  . Routine general medical examination at a health care facility 04/11/2013  . DM neuropathy, type II diabetes mellitus ([place]) 07/26/2012  . Hyperlipidemia 07/26/2012  . 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 ED (erectile dysfunction) 07/26/2012   Kerin Perna, PTA 02/21/2016 12:47 PM   [facility] [place], [place], [address] Phone: [phone]   Fax:  [phone]  Name: NYAIR SAMPAIO MRN: [id number] Date of Birth: Jan 31, 1961

## 2016-02-23 ENCOUNTER — Other Ambulatory Visit: Payer: Self-pay | Admitting: Physician Assistant

## 2016-02-25 NOTE — Telephone Encounter (Signed)
eScribe request from [facility] for refill on 01/15/16 Last filled - 01/15/16, #60x0 Last AEX - 01/15/16 Next AEX - 3-Mths Refill sent per [facility] refill protocol/SLS

## 2016-02-26 ENCOUNTER — Ambulatory Visit (INDEPENDENT_AMBULATORY_CARE_PROVIDER_SITE_OTHER): Payer: BLUE CROSS/BLUE SHIELD | Admitting: Physical Therapy

## 2016-02-26 DIAGNOSIS — M79671 Pain in right foot: Secondary | ICD-10-CM

## 2016-02-26 DIAGNOSIS — M6281 Muscle weakness (generalized): Secondary | ICD-10-CM

## 2016-02-26 DIAGNOSIS — M25671 Stiffness of right ankle, not elsewhere classified: Secondary | ICD-10-CM

## 2016-02-26 NOTE — Therapy (Signed)
[place], [place], [address] Phone: [phone]   Fax:  [phone]  Physical Therapy Treatment  Patient Details  Name: Steve [name] MRN: [id number] Date of Birth: Sep 10, 1961 Referring Provider: Dr. Jacqualyn Posey   Encounter Date: 02/26/2016      PT End of Session - 02/26/16 1016    Visit Number 4   Number of Visits 12   Date for PT Re-Evaluation 03/12/16   PT Start Time 0935   PT Stop Time 1017   PT Time Calculation (min) 42 min   Activity Tolerance Patient tolerated treatment well;No increased pain      Past Medical History  Diagnosis Date  . Diabetes mellitus without complication ([place])   . Hypertension   . Hyperlipidemia   . Complication of anesthesia     has night terrors after anesthesia but can be violent when waking up  . 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 Pt stated "I forget alot of things"  . Frequent urination at night     when blood sugar runs high  . Psoriasis of scalp     nose and face; occurs mainly in winter   . Seasonal allergies   . CAD (coronary artery disease)   . Sinus infection 12/16/13  . ADHD (attention deficit hyperactivity disorder)   . Allergy   . Depression   . Anxiety     Past Surgical History  Procedure Laterality Date  . Hernia repair  2010    abdominal hernia  . Knee arthroscopy  2012    left knee  . Cardiac catheterization      no PCI approx 10 years ago - [place] surgery      right ring finger  . Knee surgery    . Total knee arthroplasty Left 12/28/2013    Procedure: TOTAL KNEE ARTHROPLASTY;  Surgeon: Ninetta Lights, MD;  Location: [facility];  Service: Orthopedics;  Laterality: Left;  . Nasal septoplasty w/ turbinoplasty  09/19/2015    Procedure: NASAL SEPTOPLASTY WITH TURBINATE REDUCTION;  Surgeon: Jodi Marble, MD;  Location: [facility];  Service: ENT;;    There were no vitals filed for this visit.      Subjective  Assessment - 02/26/16 0943    Subjective Pt reports his Rt ankle is feeling better today.  Exercises are relieving some of the heel pain each day. Lt knee swelled up last night, this morning it is painful.  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    Pertinent History L TKR 2 years ago, DM, HTN   How long can you walk comfortably? 2-3 hours    Patient Stated Goals decrease pain   Currently in Pain? Yes   Pain Score 1   with or without WB into LE   Pain Location Heel   Pain Orientation Right   Pain Descriptors / Indicators Burning   Aggravating Factors  standing prolonged    Pain Relieving Factors rest, ice, exercise    Multiple Pain Sites Yes   Pain Score 3   Pain Location Knee   Pain Orientation Left   Pain Descriptors / Indicators Sharp   Aggravating Factors  moving    Pain Relieving Factors rest             [facility] PT Assessment - 02/26/16 0001    Assessment   Medical Diagnosis R plantar fasciitis   Referring  Provider Dr. Wagoner    Onset Date/Surgical Date 07/13/16   Next MD Visit not scheduled yet.    Strength   Right/Left Hip Right;Left   Right Hip Flexion 5/5   Right Hip ABduction 5/5   Left Hip Flexion 5/5   Left Hip ABduction 5/5   Right/Left Knee Right;Left   Right Knee Flexion 5/5   Right Knee Extension 5/5   Left Knee Flexion 5/5   Left Knee Extension 5/5   Flexibility   Hamstrings Lt to 60 deg, Rt to 65 deg   Quadriceps Lt knee 112 deg, Rt knee flexion 128 deg          [facility] Adult PT Treatment/Exercise - 02/26/16 0001    Iontophoresis   Type of Iontophoresis Dexamethasone   Location Rt heel   Dose 1.0 cc   Time 6 hr patch   Ankle Exercises: Stretches   Plantar Fascia Stretch 2 reps;30 seconds  each foot   Soleus Stretch 2 reps;30 seconds  each leg   Other Stretch supine hamstring stretch 30 sec x 2 reps each side   Other Stretch prone quad stretch x 30 sec x 2   each leg   Ankle Exercises: Aerobic   Stationary Bike L3:  5 min    Ankle Exercises: Seated   Towel Crunch 5 reps   Towel Inversion/Eversion 4 reps  with shoe as weight   Ankle Exercises: Standing   BAPS Level 2;Standing;10 reps   SLS Multiple reps RLE without UE support ~10 sec            PT Long Term Goals - 02/26/16 0950    PT LONG TERM GOAL #1   Title pt will decrease pain to 4/10 or less with walking in the home   Time 6   Period Weeks   Status On-going   PT LONG TERM GOAL #2   Title pt will improve L hip abductor strength to 4+/5 or greater to improve proximal stability   Time 6   Period Weeks   Status Achieved   PT LONG TERM GOAL #3   Title pt will ambulate with equal stance time between LEs due to decreased pain   Time 6   Period Weeks   Status On-going               Plan - 02/26/16 1005    Clinical Impression Statement Pt demonstrated improved LE strength; has met LTG #2.  Overall, pain is decreasing in heel. Pt is reporting decreased symptoms after performing HEP at home.  Pt tolerated all exercises well without increase in pain.    Rehab Potential Good   PT Frequency 2x / week   PT Duration 6 weeks   PT Treatment/Interventions Cryotherapy;Electrical Stimulation;Iontophoresis 4mg/ml Dexamethasone;Moist Heat;Ultrasound;Therapeutic activities;Therapeutic exercise;Neuromuscular re-education;Patient/family education;Manual techniques;Passive range of motion;Taping   PT Next Visit Plan continue progressive stretching/strengthening RLE; modalities as indicated.    Consulted and Agree with Plan of Care Patient      Patient will benefit from skilled therapeutic intervention in order to improve the following deficits and impairments:  Abnormal gait, Decreased activity tolerance, Decreased mobility, Decreased strength, Decreased range of motion, Difficulty walking, Impaired flexibility, Pain  Visit Diagnosis: Heel pain, right  Muscle weakness (generalized)  Stiffness of right ankle, not elsewhere  classified     Problem List Patient Active Problem List   Diagnosis Date Noted  . Plantar fasciitis 01/10/2016  . Hammertoe 01/10/2016  . Deviated nasal septum 09/19/2015  . Abdominal   pain 07/26/2015  . Mass of leg 06/18/2015  . Numbness of left foot 03/23/2015  . Cellulitis of leg, left 08/01/2014  . Accessory skin tags 08/01/2014  . Hypogonadism in male 07/24/2014  . Essential hypertension, benign 07/24/2014  . Sinusitis, chronic 07/24/2014  . Post herpetic neuralgia 05/05/2014  . Skin lesion 04/09/2014  . Gout 12/06/2013  . Allergic rhinitis 09/07/2013  . Atypical chest pain 07/15/2013  . Left ankle pain 06/14/2013  . Rib injury 06/14/2013  . Pain in left ankle w/ effusion 05/24/2013  . Routine general medical examination at a health care facility 04/11/2013  . DM neuropathy, type II diabetes mellitus ([facility]) 07/26/2012  . Hyperlipidemia 07/26/2012  . 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 ED (erectile dysfunction) 07/26/2012   Jennifer Carlson-Long, PTA 02/26/2016 10:25 AM  [facility] [address] [place], [place], [address] Phone: [phone]   Fax:  [phone]  Name: Steve [name] MRN: [id number] Date of Birth: 08/08/1961     

## 2016-03-04 ENCOUNTER — Encounter: Payer: BLUE CROSS/BLUE SHIELD | Admitting: Physical Therapy

## 2016-03-06 ENCOUNTER — Ambulatory Visit (INDEPENDENT_AMBULATORY_CARE_PROVIDER_SITE_OTHER): Payer: BLUE CROSS/BLUE SHIELD | Admitting: Physical Therapy

## 2016-03-06 DIAGNOSIS — M79671 Pain in right foot: Secondary | ICD-10-CM | POA: Diagnosis not present

## 2016-03-06 DIAGNOSIS — M25671 Stiffness of right ankle, not elsewhere classified: Secondary | ICD-10-CM | POA: Diagnosis not present

## 2016-03-06 DIAGNOSIS — M6281 Muscle weakness (generalized): Secondary | ICD-10-CM

## 2016-03-06 NOTE — Therapy (Signed)
[address] [place], [place], [address] Phone: [phone]   Fax:  [phone]  Physical Therapy Treatment  Patient Details  Name: Steve [name] MRN: [id number] Date of Birth: October 26, 1960 Referring Provider: Dr. Jacqualyn Posey   Encounter Date: 03/06/2016      PT End of Session - 03/06/16 1023    Visit Number 5   Number of Visits 12   Date for PT Re-Evaluation 03/12/16   PT Start Time 1017   PT Stop Time 1106   PT Time Calculation (min) 49 min   Activity Tolerance Patient tolerated treatment well;No increased pain      Past Medical History  Diagnosis Date  . Diabetes mellitus without complication ([place])   . Hypertension   . Hyperlipidemia   . Complication of anesthesia     has night terrors after anesthesia but can be violent when waking up  . 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 Pt stated "I forget alot of things"  . Frequent urination at night     when blood sugar runs high  . Psoriasis of scalp     nose and face; occurs mainly in winter   . Seasonal allergies   . CAD (coronary artery disease)   . Sinus infection 12/16/13  . ADHD (attention deficit hyperactivity disorder)   . Allergy   . Depression   . Anxiety     Past Surgical History  Procedure Laterality Date  . Hernia repair  2010    abdominal hernia  . Knee arthroscopy  2012    left knee  . Cardiac catheterization      no PCI approx 10 years ago - [place] surgery      right ring finger  . Knee surgery    . Total knee arthroplasty Left 12/28/2013    Procedure: TOTAL KNEE ARTHROPLASTY;  Surgeon: Ninetta Lights, MD;  Location: [facility];  Service: Orthopedics;  Laterality: Left;  . Nasal septoplasty w/ turbinoplasty  09/19/2015    Procedure: NASAL SEPTOPLASTY WITH TURBINATE REDUCTION;  Surgeon: Jodi Marble, MD;  Location: [facility];  Service: ENT;;    There were no vitals filed for this visit.      Subjective  Assessment - 03/06/16 1023    Subjective Pt said he bought gel inserts and this has helped.  He has been compliant with HEP (except towel crunches since this is difficult).  Today he is without brace on ankle, desires to wean from it.    Pertinent History L TKR 2 years ago, DM, HTN   Patient Stated Goals decrease pain   Currently in Pain? Yes   Pain Score 0-No pain  up to 1/10 with prolonged weight bearing    Pain Location Heel   Pain Orientation Right            [facility] PT Assessment - 03/06/16 0001    Assessment   Medical Diagnosis R plantar fasciitis   Referring Provider Dr. Jacqualyn Posey    Onset Date/Surgical Date 07/13/16   Next MD Visit PRN    Flexibility   Hamstrings Lt 75 deg, Rt 80 deg    Quadriceps Lt knee 115 deg flexion, Rt knee 132 deg flexion           [facility] Adult PT Treatment/Exercise - 03/06/16 0001    Ultrasound   Ultrasound Location Rt heel into plantar fascia    Ultrasound Parameters 50%, 1.16mz, 1.2  w/cm2 x 8 min    Ultrasound Goals Pain   Manual Therapy   Manual Therapy Taping   Manual therapy comments Rock tape applied to plantar fascia of Rt foot with perpendicular strips to support arch and decompress tissue.    Ankle Exercises: Standing   BAPS Level 2;Standing;10 reps  PF/DF, side/side, CW, CCW   SLS Multiple reps RLE without UE support ~10 sec, LLE up to 40 sec     Ankle Exercises: Stretches   Plantar Fascia Stretch 2 reps;30 seconds  each foot   Soleus Stretch 2 reps;30 seconds  each leg   Other Stretch supine hamstring stretch 30 sec x 3 reps each side, Adductor stretch x 2 reps x 30 sec    Other Stretch prone quad stretch x 30 sec x 2   each leg   Ankle Exercises: Aerobic   Stationary Bike L5: 5 min    Ankle Exercises: Seated   Other Seated Ankle Exercises Seated marching with black band x 3 sets of 10                PT Education - 03/06/16 1050    Education provided Yes   Education Details HEP    Person(s) Educated Patient    Methods Handout;Explanation   Comprehension Verbalized understanding;Returned demonstration             PT Long Term Goals - 03/06/16 1203    PT LONG TERM GOAL #1   Title pt will decrease pain to 4/10 or less with walking in the home   Time 6   Period Weeks   Status Achieved   PT LONG TERM GOAL #2   Title pt will improve L hip abductor strength to 4+/5 or greater to improve proximal stability   Time 6   Period Weeks   Status Achieved   PT LONG TERM GOAL #3   Title pt will ambulate with equal stance time between LEs due to decreased pain   Time 6   Period Weeks   Status On-going               Plan - 03/06/16 1204    Clinical Impression Statement Pt reporting overall decrease in pain in Rt heel to 1/10; has met LTG #2.  Pt demonstrated improved hamstring and quad flexibility.  Pt progressing well towards remaining goals, nearing d/c.    Rehab Potential Good   PT Frequency 2x / week   PT Duration 6 weeks   PT Treatment/Interventions Cryotherapy;Electrical Stimulation;Iontophoresis '4mg'$ /ml Dexamethasone;Moist Heat;Ultrasound;Therapeutic activities;Therapeutic exercise;Neuromuscular re-education;Patient/family education;Manual techniques;Passive range of motion;Taping   PT Next Visit Plan Assess response to tape and readiness to d/c.     Consulted and Agree with Plan of Care Patient      Patient will benefit from skilled therapeutic intervention in order to improve the following deficits and impairments:  Abnormal gait, Decreased activity tolerance, Decreased mobility, Decreased strength, Decreased range of motion, Difficulty walking, Impaired flexibility, Pain  Visit Diagnosis: Heel pain, right  Muscle weakness (generalized)  Stiffness of right ankle, not elsewhere classified     Problem List Patient Active Problem List   Diagnosis Date Noted  . Plantar fasciitis 01/10/2016  . Hammertoe 01/10/2016  . Deviated nasal septum 09/19/2015  . Abdominal pain  07/26/2015  . Mass of leg 06/18/2015  . Numbness of left foot 03/23/2015  . Cellulitis of leg, left 08/01/2014  . Accessory skin tags 08/01/2014  . Hypogonadism in male 07/24/2014  . Essential hypertension, benign  07/24/2014  . Sinusitis, chronic 07/24/2014  . Post herpetic neuralgia 05/05/2014  . Skin lesion 04/09/2014  . Gout 12/06/2013  . Allergic rhinitis 09/07/2013  . Atypical chest pain 07/15/2013  . Left ankle pain 06/14/2013  . Rib injury 06/14/2013  . Pain in left ankle w/ effusion 05/24/2013  . Routine general medical examination at a health care facility 04/11/2013  . DM neuropathy, type II diabetes mellitus ([place]) 07/26/2012  . Hyperlipidemia 07/26/2012  . 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 ED (erectile dysfunction) 07/26/2012   Kerin Perna, PTA 03/06/2016 12:07 PM  [place], [place], [address] Phone: [phone]   Fax:  [phone]  Name: Steve [name] MRN: [id number] Date of Birth: October 11, 1961

## 2016-03-06 NOTE — Patient Instructions (Signed)
Adductor Stretch: Reclined (Strap, Wall)    Warm up with leg vertical. Rotate leg to side and fix foot to wall. Anchor opposite hip. Hold for __30__ seconds . Repeat __2__ times each leg.  [facility] at [facility] [place], [place] [address]  [phone] (office) [phone] (fax)

## 2016-03-12 ENCOUNTER — Emergency Department (HOSPITAL_COMMUNITY)
Admission: EM | Admit: 2016-03-12 | Discharge: 2016-03-13 | Disposition: A | Discharge status: Home / Self Care | Payer: BLUE CROSS/BLUE SHIELD | Attending: Emergency Medicine | Admitting: Emergency Medicine

## 2016-03-12 ENCOUNTER — Ambulatory Visit (INDEPENDENT_AMBULATORY_CARE_PROVIDER_SITE_OTHER): Payer: BLUE CROSS/BLUE SHIELD | Admitting: Physical Therapy

## 2016-03-12 ENCOUNTER — Encounter (HOSPITAL_COMMUNITY): Payer: Self-pay | Admitting: *Deleted

## 2016-03-12 DIAGNOSIS — E785 Hyperlipidemia, unspecified: Secondary | ICD-10-CM | POA: Diagnosis not present

## 2016-03-12 DIAGNOSIS — Z7984 Long term (current) use of oral hypoglycemic drugs: Secondary | ICD-10-CM | POA: Diagnosis not present

## 2016-03-12 DIAGNOSIS — F418 Other specified anxiety disorders: Secondary | ICD-10-CM | POA: Diagnosis not present

## 2016-03-12 DIAGNOSIS — M25671 Stiffness of right ankle, not elsewhere classified: Secondary | ICD-10-CM

## 2016-03-12 DIAGNOSIS — K859 Acute pancreatitis without necrosis or infection, unspecified: Secondary | ICD-10-CM | POA: Diagnosis not present

## 2016-03-12 DIAGNOSIS — Z7982 Long term (current) use of aspirin: Secondary | ICD-10-CM | POA: Insufficient documentation

## 2016-03-12 DIAGNOSIS — Z794 Long term (current) use of insulin: Secondary | ICD-10-CM | POA: Diagnosis not present

## 2016-03-12 DIAGNOSIS — Z96652 Presence of left artificial knee joint: Secondary | ICD-10-CM | POA: Diagnosis not present

## 2016-03-12 DIAGNOSIS — E119 Type 2 diabetes mellitus without complications: Secondary | ICD-10-CM | POA: Insufficient documentation

## 2016-03-12 DIAGNOSIS — Z79899 Other long term (current) drug therapy: Secondary | ICD-10-CM | POA: Diagnosis not present

## 2016-03-12 DIAGNOSIS — R1010 Upper abdominal pain, unspecified: Secondary | ICD-10-CM | POA: Diagnosis present

## 2016-03-12 DIAGNOSIS — I10 Essential (primary) hypertension: Secondary | ICD-10-CM | POA: Diagnosis not present

## 2016-03-12 DIAGNOSIS — I2510 Atherosclerotic heart disease of native coronary artery without angina pectoris: Secondary | ICD-10-CM | POA: Insufficient documentation

## 2016-03-12 DIAGNOSIS — M79671 Pain in right foot: Secondary | ICD-10-CM

## 2016-03-12 DIAGNOSIS — M6281 Muscle weakness (generalized): Secondary | ICD-10-CM | POA: Diagnosis not present

## 2016-03-12 LAB — URINALYSIS, ROUTINE W REFLEX MICROSCOPIC
Bilirubin Urine: NEGATIVE
GLUCOSE, UA: NEGATIVE mg/dL
HGB URINE DIPSTICK: NEGATIVE
Ketones, ur: NEGATIVE mg/dL
LEUKOCYTES UA: NEGATIVE
Nitrite: NEGATIVE
Protein, ur: NEGATIVE mg/dL
SPECIFIC GRAVITY, URINE: 1.014 (ref 1.005–1.030)
pH: 7 (ref 5.0–8.0)

## 2016-03-12 LAB — CBC WITH DIFFERENTIAL/PLATELET
BASOS ABS: 0 10*3/uL (ref 0.0–0.1)
BASOS PCT: 0 %
EOS ABS: 0.1 10*3/uL (ref 0.0–0.7)
Eosinophils Relative: 2 %
HCT: 38.8 % — ABNORMAL LOW (ref 39.0–52.0)
Hemoglobin: 12.1 g/dL — ABNORMAL LOW (ref 13.0–17.0)
LYMPHS PCT: 32 %
Lymphs Abs: 2.3 10*3/uL (ref 0.7–4.0)
MCH: 24.1 pg — ABNORMAL LOW (ref 26.0–34.0)
MCHC: 31.2 g/dL (ref 30.0–36.0)
MCV: 77.1 fL — AB (ref 78.0–100.0)
MONO ABS: 0.5 10*3/uL (ref 0.1–1.0)
MONOS PCT: 6 %
NEUTROS ABS: 4.3 10*3/uL (ref 1.7–7.7)
Neutrophils Relative %: 60 %
PLATELETS: 347 10*3/uL (ref 150–400)
RBC: 5.03 MIL/uL (ref 4.22–5.81)
RDW: 14.9 % (ref 11.5–15.5)
WBC: 7.2 10*3/uL (ref 4.0–10.5)

## 2016-03-12 LAB — LIPASE, BLOOD: Lipase: 112 U/L — ABNORMAL HIGH (ref 11–51)

## 2016-03-12 NOTE — ED Notes (Signed)
Patient presents with upper abd pain.  Increases after eating

## 2016-03-12 NOTE — Patient Instructions (Addendum)
Adductor Stretch: Reclined (Strap, Wall)    Warm up with leg vertical. Rotate leg to side and fix foot to wall. Anchor opposite hip. Hold for __30__ seconds . Repeat __2__ times each leg.   ** practice standing on 1 leg - goal of 30-60 seconds easily.   [facility] Re[facility] at [facility] [place], [place] [address]

## 2016-03-12 NOTE — Therapy (Addendum)
[facility] [place] [address] [place], [place], [address] Phone: [phone]   Fax:  [phone]  Physical Therapy Treatment  Patient Details  Name: Steve [name] MRN: [id number] Date of Birth: Jan 18, 1961 Referring Provider: Dr. Ardelle Anton   Encounter Date: 03/12/2016      PT End of Session - 03/12/16 1025    Visit Number 6   Number of Visits 12   Date for PT Re-Evaluation 03/12/16   PT Start Time 1021   PT Stop Time 1107   PT Time Calculation (min) 46 min      Past Medical History  Diagnosis Date  . Diabetes mellitus without complication (HCC)   . Hypertension   . Hyperlipidemia   . Complication of anesthesia     has night terrors after anesthesia but can be violent when waking up  . 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 Pt stated "I forget alot of things"  . Frequent urination at night     when blood sugar runs high  . Psoriasis of scalp     nose and face; occurs mainly in winter   . Seasonal allergies   . CAD (coronary artery disease)   . Sinus infection 12/16/13  . ADHD (attention deficit hyperactivity disorder)   . Allergy   . Depression   . Anxiety     Past Surgical History  Procedure Laterality Date  . Hernia repair  2010    abdominal hernia  . Knee arthroscopy  2012    left knee  . Cardiac catheterization      no PCI approx 10 years ago [facility]  . Finger surgery      right ring finger  . Knee surgery    . Total knee arthroplasty Left 12/28/2013    Procedure: TOTAL KNEE ARTHROPLASTY;  Surgeon: Loreta Ave, MD;  Location: [facility] OR;  Service: Orthopedics;  Laterality: Left;  . Nasal septoplasty w/ turbinoplasty  09/19/2015    Procedure: NASAL SEPTOPLASTY WITH TURBINATE REDUCTION;  Surgeon: Flo Shanks, MD;  Location: [facility] OR;  Service: ENT;;    There were no vitals filed for this visit.      Subjective Assessment - 03/12/16 1029    Subjective Pt reports he weaned himself  out of ASO for Rt ankle.  Hasn't had any pain in Rt foot in5 days.  Continues to perform HEP daily.    Patient Stated Goals decrease pain   Currently in Pain? No/denies   Pain Score 0-No pain            [facility] PT Assessment - 03/12/16 0001    Assessment   Medical Diagnosis R plantar fasciitis   Referring Provider Dr. Ardelle Anton    Onset Date/Surgical Date 07/13/16   Next MD Visit PRN   Observation/Other Assessments   Focus on Therapeutic Outcomes (FOTO)  42% limited. Improved from intake at 49%.    ROM / Strength   AROM / PROM / Strength AROM   AROM   AROM Assessment Site Ankle;Knee   Right/Left Knee Right;Left   Right Knee Flexion 139   Left Knee Flexion 125   Right/Left Ankle Right;Left   Right Ankle Dorsiflexion 6   Left Ankle Dorsiflexion 4   Flexibility   Quadriceps Lt knee 117 deg, Rt 132 deg           [facility] Adult PT Treatment/Exercise - 03/12/16 0001    Ultrasound   Ultrasound Location Rt  plantar fascia   Ultrasound Parameters 100%, 1.0 w/cm2 x 8 min    Ultrasound Goals Pain   Ankle Exercises: Stretches   Plantar Fascia Stretch 2 reps;30 seconds  each foot   Soleus Stretch 2 reps;30 seconds  each leg   Other Stretch supine hamstring stretch 30 sec x 3 reps each side, Adductor stretch x 2 reps x 30 sec    Other Stretch prone quad stretch x 30 sec x 2   each leg   Ankle Exercises: Aerobic   Stationary Bike L5: 5 min    Ankle Exercises: Standing   BAPS Standing;Level 3;10 reps  PF/DF, side/side, CW, CCW   SLS Multiple reps RLE without UE support ~20 sec, LLE up to 40 sec                  PT Education - 03/12/16 1229    Education provided Yes   Education Details HEP    Person(s) Educated Patient   Methods Explanation;Handout   Comprehension Returned demonstration;Verbalized understanding             PT Long Term Goals - 03/12/16 1041    PT LONG TERM GOAL #1   Title pt will decrease pain to 4/10 or less with walking in the home   Time 6    Period Weeks   Status Achieved   PT LONG TERM GOAL #2   Title pt will improve L hip abductor strength to 4+/5 or greater to improve proximal stability   Time 6   Period Weeks   Status Achieved   PT LONG TERM GOAL #3   Title pt will ambulate with equal stance time between LEs due to decreased pain   Time 6   Period Weeks   Status Achieved               Plan - 03/12/16 1225    Clinical Impression Statement Pt tolerated all exercises well without increase in pain.  Pt continues with tightness in bilat calves but states he is satisfied with current level of function. He has met all his goals and verbalized he is ready to d/c to HEP  at this time.    Rehab Potential Good   PT Frequency 2x / week   PT Duration 6 weeks   PT Treatment/Interventions Cryotherapy;Electrical Stimulation;Iontophoresis '4mg'$ /ml Dexamethasone;Moist Heat;Ultrasound;Therapeutic activities;Therapeutic exercise;Neuromuscular re-education;Patient/family education;Manual techniques;Passive range of motion;Taping   PT Next Visit Plan Spoke to supervising PT; will d/c to HEP at this time.    Consulted and Agree with Plan of Care Patient      Patient will benefit from skilled therapeutic intervention in order to improve the following deficits and impairments:  Abnormal gait, Decreased activity tolerance, Decreased mobility, Decreased strength, Decreased range of motion, Difficulty walking, Impaired flexibility, Pain  Visit Diagnosis: Heel pain, right  Muscle weakness (generalized)  Stiffness of right ankle, not elsewhere classified     Problem List Patient Active Problem List   Diagnosis Date Noted  . Plantar fasciitis 01/10/2016  . Hammertoe 01/10/2016  . Deviated nasal septum 09/19/2015  . Abdominal pain 07/26/2015  . Mass of leg 06/18/2015  . Numbness of left foot 03/23/2015  . Cellulitis of leg, left 08/01/2014  . Accessory skin tags 08/01/2014  . Hypogonadism in male 07/24/2014  . Essential  hypertension, benign 07/24/2014  . Sinusitis, chronic 07/24/2014  . Post herpetic neuralgia 05/05/2014  . Skin lesion 04/09/2014  . Gout 12/06/2013  . Allergic rhinitis 09/07/2013  . Atypical chest  pain 07/15/2013  . Left ankle pain 06/14/2013  . Rib injury 06/14/2013  . Pain in left ankle w/ effusion 05/24/2013  . Routine general medical examination at a health care facility 04/11/2013  . DM neuropathy, type II diabetes mellitus ([place]) 07/26/2012  . Hyperlipidemia 07/26/2012  . 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 ED (erectile dysfunction) 07/26/2012   Kerin Perna, PTA 03/12/2016 12:30 PM  [place] [address], [place], [address] Phone: [phone]   Fax:  [phone]  Name: Steve [name] MRN: [id number] Date of Birth: 02-10-1961    PHYSICAL THERAPY DISCHARGE SUMMARY  Visits from Start of Care: 6  Current functional level related to goals / functional outcomes: See above for last measurements   Remaining deficits: See above   Education / Equipment: HEP Plan: Patient agrees to discharge.  Patient goals were met. Patient is being discharged due to being pleased with the current functional level.  ?????    Jeral Pinch, PT 04/03/2016 10:39 AM

## 2016-03-13 ENCOUNTER — Emergency Department (HOSPITAL_COMMUNITY): Payer: BLUE CROSS/BLUE SHIELD

## 2016-03-13 ENCOUNTER — Encounter (HOSPITAL_COMMUNITY): Payer: Self-pay | Admitting: Radiology

## 2016-03-13 LAB — COMPREHENSIVE METABOLIC PANEL
ALBUMIN: 3.7 g/dL (ref 3.5–5.0)
ALT: 30 U/L (ref 17–63)
AST: 26 U/L (ref 15–41)
Alkaline Phosphatase: 66 U/L (ref 38–126)
Anion gap: 9 (ref 5–15)
BUN: 8 mg/dL (ref 6–20)
CHLORIDE: 102 mmol/L (ref 101–111)
CO2: 24 mmol/L (ref 22–32)
CREATININE: 0.8 mg/dL (ref 0.61–1.24)
Calcium: 9 mg/dL (ref 8.9–10.3)
GFR calc Af Amer: >60 mL/min (ref >60)
GFR calc non Af Amer: >60 mL/min (ref >60)
Glucose, Bld: 190 mg/dL — ABNORMAL HIGH (ref 65–99)
POTASSIUM: 3.9 mmol/L (ref 3.5–5.1)
SODIUM: 135 mmol/L (ref 135–145)
Total Bilirubin: 0.3 mg/dL (ref 0.3–1.2)
Total Protein: 6.9 g/dL (ref 6.5–8.1)

## 2016-03-13 MED ORDER — IOPAMIDOL (ISOVUE-300) INJECTION 61%
INTRAVENOUS | Status: AC
  Administered 2016-03-13: 100 mL
  Filled 2016-03-13: qty 100

## 2016-03-13 MED ORDER — PANTOPRAZOLE SODIUM 40 MG PO TBEC
40.0000 mg | DELAYED_RELEASE_TABLET | Freq: Once | ORAL | Status: AC
  Administered 2016-03-13: 40 mg via ORAL
  Filled 2016-03-13: qty 1

## 2016-03-13 MED ORDER — ONDANSETRON HCL 4 MG/2ML IJ SOLN
4.0000 mg | Freq: Once | INTRAMUSCULAR | Status: AC
  Administered 2016-03-13: 4 mg via INTRAVENOUS
  Filled 2016-03-13: qty 2

## 2016-03-13 MED ORDER — MORPHINE SULFATE (PF) 4 MG/ML IV SOLN
4.0000 mg | Freq: Once | INTRAVENOUS | Status: AC
  Administered 2016-03-13: 4 mg via INTRAVENOUS
  Filled 2016-03-13: qty 1

## 2016-03-13 MED ORDER — GI COCKTAIL ~~LOC~~
30.0000 mL | Freq: Once | ORAL | Status: AC
  Administered 2016-03-13: 30 mL via ORAL
  Filled 2016-03-13: qty 30

## 2016-03-13 MED ORDER — ONDANSETRON HCL 4 MG PO TABS: 4.0000 mg | ORAL_TABLET | Freq: Four times a day (QID) | ORAL | Status: DC | PRN

## 2016-03-13 MED ORDER — OXYCODONE-ACETAMINOPHEN 5-325 MG PO TABS: 1.0000 | ORAL_TABLET | ORAL | Status: DC | PRN

## 2016-03-13 NOTE — ED Notes (Addendum)
Pt ambulatory to room A09 from lobby with ED tech; steady gait noted

## 2016-03-13 NOTE — ED Notes (Signed)
RN called CT to inquire estimated time of scan; Steve [name], [place] states he is next on the list; pt updated

## 2016-03-13 NOTE — Discharge Instructions (Signed)
Return if pain is not being adequately controlled, or if nausea is not being adequately controlled.   Acute Pancreatitis Acute pancreatitis is a disease in which the pancreas becomes suddenly inflamed. The pancreas is a large gland located behind your stomach. The pancreas produces enzymes that help digest food. The pancreas also releases the hormones glucagon and insulin that help regulate blood sugar. Damage to the pancreas occurs when the digestive enzymes from the pancreas are activated and begin attacking the pancreas before being released into the intestine. Most acute attacks last a couple of days and can cause serious complications. Some people become dehydrated and develop low blood pressure. In severe cases, bleeding into the pancreas can lead to shock and can be life-threatening. The lungs, heart, and kidneys may fail. CAUSES  Pancreatitis can happen to anyone. In some cases, the cause is unknown. Most cases are caused by:  Alcohol abuse.  Gallstones. Other less common causes are:  Certain medicines.  Exposure to certain chemicals.  Infection.  Damage caused by an accident (trauma).  Abdominal surgery. SYMPTOMS   Pain in the upper abdomen that may radiate to the back.  Tenderness and swelling of the abdomen.  Nausea and vomiting. DIAGNOSIS  Your caregiver will perform a physical exam. Blood and stool tests may be done to confirm the diagnosis. Imaging tests may also be done, such as X-rays, CT scans, or an ultrasound of the abdomen. TREATMENT  Treatment usually requires a stay in the hospital. Treatment may include:  Pain medicine.  Fluid replacement through an intravenous line (IV).  Placing a tube in the stomach to remove stomach contents and control vomiting.  Not eating for 3 or 4 days. This gives your pancreas a rest, because enzymes are not being produced that can cause further damage.  Antibiotic medicines if your condition is caused by an  infection.  Surgery of the pancreas or gallbladder. HOME CARE INSTRUCTIONS   Follow the diet advised by your caregiver. This may involve avoiding alcohol and decreasing the amount of fat in your diet.  Eat smaller, more frequent meals. This reduces the amount of digestive juices the pancreas produces.  Drink enough fluids to keep your urine clear or pale yellow.  Only take over-the-counter or prescription medicines as directed by your caregiver.  Avoid drinking alcohol if it caused your condition.  Do not smoke.  Get plenty of rest.  Check your blood sugar at home as directed by your caregiver.  Keep all follow-up appointments as directed by your caregiver. SEEK MEDICAL CARE IF:   You do not recover as quickly as expected.  You develop new or worsening symptoms.  You have persistent pain, weakness, or nausea.  You recover and then have another episode of pain. SEEK IMMEDIATE MEDICAL CARE IF:   You are unable to eat or keep fluids down.  Your pain becomes severe.  You have a fever or persistent symptoms for more than 2 to 3 days.  You have a fever and your symptoms suddenly get worse.  Your skin or the white part of your eyes turn yellow (jaundice).  You develop vomiting.  You feel dizzy, or you faint.  Your blood sugar is high (over 300 mg/dL). MAKE SURE YOU:   Understand these instructions.  Will watch your condition.  Will get help right away if you are not doing well or get worse.   This information is not intended to replace advice given to you by your health care provider. Make sure you  discuss any questions you have with your health care provider.   Document Released: 09/29/2005 Document Revised: 03/30/2012 Document Reviewed: 01/08/2012 Elsevier Interactive Patient Education 2016 Elsevier Inc.  Acetaminophen; Oxycodone tablets What is this medicine? ACETAMINOPHEN; OXYCODONE (a set a MEE noe fen; ox i KOE done) is a pain reliever. It is used to  treat moderate to severe pain. This medicine may be used for other purposes; ask your health care provider or pharmacist if you have questions. What should I tell my health care provider before I take this medicine? They need to know if you have any of these conditions: -brain tumor -Crohn's disease, inflammatory bowel disease, or ulcerative colitis -drug abuse or addiction -head injury -heart or circulation problems -if you often drink alcohol -kidney disease or problems going to the bathroom -liver disease -lung disease, asthma, or breathing problems -an unusual or allergic reaction to acetaminophen, oxycodone, other opioid analgesics, other medicines, foods, dyes, or preservatives -pregnant or trying to get pregnant -breast-feeding How should I use this medicine? Take this medicine by mouth with a full glass of water. Follow the directions on the prescription label. You can take it with or without food. If it upsets your stomach, take it with food. Take your medicine at regular intervals. Do not take it more often than directed. Talk to your pediatrician regarding the use of this medicine in children. Special care may be needed. Patients over 77 years old may have a stronger reaction and need a smaller dose. Overdosage: If you think you have taken too much of this medicine contact a poison control center or emergency room at once. NOTE: This medicine is only for you. Do not share this medicine with others. What if I miss a dose? If you miss a dose, take it as soon as you can. If it is almost time for your next dose, take only that dose. Do not take double or extra doses. What may interact with this medicine? -alcohol -antihistamines -barbiturates like amobarbital, butalbital, butabarbital, methohexital, pentobarbital, phenobarbital, thiopental, and secobarbital -benztropine -drugs for bladder problems like solifenacin, trospium, oxybutynin, tolterodine, hyoscyamine, and  methscopolamine -drugs for breathing problems like ipratropium and tiotropium -drugs for certain stomach or intestine problems like propantheline, homatropine methylbromide, glycopyrrolate, atropine, belladonna, and dicyclomine -general anesthetics like etomidate, ketamine, nitrous oxide, propofol, desflurane, enflurane, halothane, isoflurane, and sevoflurane -medicines for depression, anxiety, or psychotic disturbances -medicines for sleep -muscle relaxants -naltrexone -narcotic medicines (opiates) for pain -phenothiazines like perphenazine, thioridazine, chlorpromazine, mesoridazine, fluphenazine, prochlorperazine, promazine, and trifluoperazine -scopolamine -tramadol -trihexyphenidyl This list may not describe all possible interactions. Give your health care provider a list of all the medicines, herbs, non-prescription drugs, or dietary supplements you use. Also tell them if you smoke, drink alcohol, or use illegal drugs. Some items may interact with your medicine. What should I watch for while using this medicine? Tell your doctor or health care professional if your pain does not go away, if it gets worse, or if you have new or a different type of pain. You may develop tolerance to the medicine. Tolerance means that you will need a higher dose of the medication for pain relief. Tolerance is normal and is expected if you take this medicine for a long time. Do not suddenly stop taking your medicine because you may develop a severe reaction. Your body becomes used to the medicine. This does NOT mean you are addicted. Addiction is a behavior related to getting and using a drug for a non-medical reason. If you  have pain, you have a medical reason to take pain medicine. Your doctor will tell you how much medicine to take. If your doctor wants you to stop the medicine, the dose will be slowly lowered over time to avoid any side effects. You may get drowsy or dizzy. Do not drive, use machinery, or do  anything that needs mental alertness until you know how this medicine affects you. Do not stand or sit up quickly, especially if you are an older patient. This reduces the risk of dizzy or fainting spells. Alcohol may interfere with the effect of this medicine. Avoid alcoholic drinks. There are different types of narcotic medicines (opiates) for pain. If you take more than one type at the same time, you may have more side effects. Give your health care provider a list of all medicines you use. Your doctor will tell you how much medicine to take. Do not take more medicine than directed. Call emergency for help if you have problems breathing. The medicine will cause constipation. Try to have a bowel movement at least every 2 to 3 days. If you do not have a bowel movement for 3 days, call your doctor or health care professional. Do not take Tylenol (acetaminophen) or medicines that have acetaminophen with this medicine. Too much acetaminophen can be very dangerous. Many nonprescription medicines contain acetaminophen. Always read the labels carefully to avoid taking more acetaminophen. What side effects may I notice from receiving this medicine? Side effects that you should report to your doctor or health care professional as soon as possible: -allergic reactions like skin rash, itching or hives, swelling of the face, lips, or tongue -breathing difficulties, wheezing -confusion -light headedness or fainting spells -severe stomach pain -unusually weak or tired -yellowing of the skin or the whites of the eyes Side effects that usually do not require medical attention (report to your doctor or health care professional if they continue or are bothersome): -dizziness -drowsiness -nausea -vomiting This list may not describe all possible side effects. Call your doctor for medical advice about side effects. You may report side effects to FDA at 1-800-FDA-1088. Where should I keep my medicine? Keep out of  the reach of children. This medicine can be abused. Keep your medicine in a safe place to protect it from theft. Do not share this medicine with anyone. Selling or giving away this medicine is dangerous and against the law. This medicine may cause accidental overdose and death if it taken by other adults, children, or pets. Mix any unused medicine with a substance like cat litter or coffee grounds. Then throw the medicine away in a sealed container like a sealed bag or a coffee can with a lid. Do not use the medicine after the expiration date. Store at room temperature between 20 and 25 degrees C (68 and 77 degrees F). NOTE: This sheet is a summary. It may not cover all possible information. If you have questions about this medicine, talk to your doctor, pharmacist, or health care provider.    2016, Elsevier/Gold Standard. (2014-08-30 15:18:46)  Ondansetron tablets What is this medicine? ONDANSETRON (on DAN se tron) is used to treat nausea and vomiting caused by chemotherapy. It is also used to prevent or treat nausea and vomiting after surgery. This medicine may be used for other purposes; ask your health care provider or pharmacist if you have questions. What should I tell my health care provider before I take this medicine? They need to know if you have any of  these conditions: -heart disease -history of irregular heartbeat -liver disease -low levels of magnesium or potassium in the blood -an unusual or allergic reaction to ondansetron, granisetron, other medicines, foods, dyes, or preservatives -pregnant or trying to get pregnant -breast-feeding How should I use this medicine? Take this medicine by mouth with a glass of water. Follow the directions on your prescription label. Take your doses at regular intervals. Do not take your medicine more often than directed. Talk to your pediatrician regarding the use of this medicine in children. Special care may be needed. Overdosage: If you think  you have taken too much of this medicine contact a poison control center or emergency room at once. NOTE: This medicine is only for you. Do not share this medicine with others. What if I miss a dose? If you miss a dose, take it as soon as you can. If it is almost time for your next dose, take only that dose. Do not take double or extra doses. What may interact with this medicine? Do not take this medicine with any of the following medications: -apomorphine -certain medicines for fungal infections like fluconazole, itraconazole, ketoconazole, posaconazole, voriconazole -cisapride -dofetilide -dronedarone -pimozide -thioridazine -ziprasidone This medicine may also interact with the following medications: -carbamazepine -certain medicines for depression, anxiety, or psychotic disturbances -fentanyl -linezolid -MAOIs like Carbex, Eldepryl, Marplan, Nardil, and Parnate -methylene blue (injected into a vein) -other medicines that prolong the QT interval (cause an abnormal heart rhythm) -phenytoin -rifampicin -tramadol This list may not describe all possible interactions. Give your health care provider a list of all the medicines, herbs, non-prescription drugs, or dietary supplements you use. Also tell them if you smoke, drink alcohol, or use illegal drugs. Some items may interact with your medicine. What should I watch for while using this medicine? Check with your doctor or health care professional right away if you have any sign of an allergic reaction. What side effects may I notice from receiving this medicine? Side effects that you should report to your doctor or health care professional as soon as possible: -allergic reactions like skin rash, itching or hives, swelling of the face, lips or tongue -breathing problems -confusion -dizziness -fast or irregular heartbeat -feeling faint or lightheaded, falls -fever and chills -loss of balance or  coordination -seizures -sweating -swelling of the hands or feet -tightness in the chest -tremors -unusually weak or tired Side effects that usually do not require medical attention (report to your doctor or health care professional if they continue or are bothersome): -constipation or diarrhea -headache This list may not describe all possible side effects. Call your doctor for medical advice about side effects. You may report side effects to FDA at 1-800-FDA-1088. Where should I keep my medicine? Keep out of the reach of children. Store between 2 and 30 degrees C (36 and 86 degrees F). Throw away any unused medicine after the expiration date. NOTE: This sheet is a summary. It may not cover all possible information. If you have questions about this medicine, talk to your doctor, pharmacist, or health care provider.    2016, Elsevier/Gold Standard. (2013-07-06 16:27:45)

## 2016-03-13 NOTE — ED Notes (Signed)
Patient transported to CT 

## 2016-03-13 NOTE — ED Notes (Signed)
Patient transported to Ultrasound 

## 2016-03-13 NOTE — ED Provider Notes (Signed)
CSN: [id number]     Arrival date & time 03/12/16  2218 History  By signing my name below, I, Higinio Plan, attest that this documentation has been prepared under the direction and in the presence of Delora Fuel, MD . Electronically Signed: Higinio Plan, Scribe. 03/13/2016. 12:25 AM.    Chief Complaint  Patient presents with  . Abdominal Pain   The history is provided by the patient. No language interpreter was used.   HPI Comments:  Steve [name] is a 55 y.o. male with PMHx of DM, HLD, and HTN, who presents to the Emergency Department complaining of sudden onset, gradually worsening, 7/10, sharp pain in upper abdomen that began last night. Pt states pain is worsened when touched, drinks liquids, or when he laughs. He states that he took Advil to alleviate his pain on Monday with no relief. He notes that his BM does not look darker than normal. Pt states that this is the first time he has experienced these symptoms. Pt denies melena, nausea, constipation, diarrhea, smoking, or alcohol consumption.  No alleviating factors noted.   Past Medical History  Diagnosis Date  . Diabetes mellitus without complication ([place])   . Hypertension   . Hyperlipidemia   . Complication of anesthesia     has night terrors after anesthesia but can be violent when waking up  . 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 Pt stated "I forget alot of things"  . Frequent urination at night     when blood sugar runs high  . Psoriasis of scalp     nose and face; occurs mainly in winter   . Seasonal allergies   . CAD (coronary artery disease)   . Sinus infection 12/16/13  . ADHD (attention deficit hyperactivity disorder)   . Allergy   . Depression   . Anxiety    Past Surgical History  Procedure Laterality Date  . Hernia repair  2010    abdominal hernia  . Knee arthroscopy  2012    left knee  . Cardiac catheterization      no PCI approx 10 years ago - [place] [facility]      right ring  finger  . Knee surgery    . Total knee arthroplasty Left 12/28/2013    Procedure: TOTAL KNEE ARTHROPLASTY;  Surgeon: Ninetta Lights, MD;  Location: [place];  Service: Orthopedics;  Laterality: Left;  . Nasal septoplasty w/ turbinoplasty  09/19/2015    Procedure: NASAL SEPTOPLASTY WITH TURBINATE REDUCTION;  Surgeon: Jodi Marble, MD;  Location: [facility];  Service: ENT;;   Family History  Problem Relation Age of Onset  . Diabetes Mother   . Cancer Mother     history breast cancer  . Hypertension Mother   . Cancer Father     prostate  . Alzheimer's disease Father   . Diabetes Father   . Hypertension Father   . Cancer Maternal Uncle     colon  . Colon cancer Maternal Uncle 73  . Cancer Maternal Grandmother     breast  . Cancer Maternal Grandfather     prostate  . Cancer Maternal Uncle     prostate  . Colon cancer Maternal Uncle 71  . Cancer Maternal Uncle     prostate  . Cancer Cousin 47    metastatic colon cancer?  . Colon polyps Paternal Uncle   . Esophageal cancer Neg Hx   . Stomach cancer Neg Hx   .  Rectal cancer Neg Hx    Social History  Substance Use Topics  . Smoking status: Never Smoker   . Smokeless tobacco: Never Used  . Alcohol Use: 0.6 - 1.2 oz/week    1-2 Cans of beer per week     Comment: rare beer    Review of Systems  Gastrointestinal: Positive for abdominal pain. Negative for nausea, diarrhea and constipation.  All other systems reviewed and are negative.     Allergies  Sulfa antibiotics  Home Medications   Prior to Admission medications   Medication Sig Start Date End Date Taking? Authorizing Provider  aspirin EC 325 MG tablet Take 1 tablet (325 mg total) by mouth daily. 12/29/13   Aundra Dubin, PA-C  atorvastatin (LIPITOR) 10 MG tablet Take 1 tablet (10 mg total) by mouth daily. 01/09/16   Brunetta Jeans, PA-C  gabapentin (NEURONTIN) 300 MG capsule Take 600 mg each AM, 300 mg noon and 600 mg PM. 12/18/15   Brunetta Jeans, PA-C  insulin  lispro (HUMALOG KWIKPEN) 100 UNIT/ML KiwkPen Inject 0-12 Units into the skin 3 (three) times daily with meals. sliding scale- check sugar and inject 3 times daily before meals as below: <150- Zero units 150-200 2 units 201-250 4 units 251-300 6 units 301-350 8 units 351-400 10 units >400 12 units and contact us. 01/09/16   Brunetta Jeans, PA-C  insulin NPH Human (HUMULIN N,NOVOLIN N) 100 UNIT/ML injection 30 units in the morning and 30-40 units at bedtime. 01/09/16   Brunetta Jeans, PA-C  lisinopril (PRINIVIL,ZESTRIL) 5 MG tablet Take 1 tablet (5 mg total) by mouth daily. 01/09/16   Brunetta Jeans, PA-C  metFORMIN (GLUCOPHAGE) 1000 MG tablet Take 1 tablet (1,000 mg total) by mouth 2 (two) times daily with a meal. No further refills until follow-up. Schedule a visit. 12/18/15   Brunetta Jeans, PA-C  Multiple Vitamins-Minerals (ZINC PO) Take 1 tablet by mouth daily.     Historical Provider, MD  saxagliptin HCl (ONGLYZA) 5 MG TABS tablet Take 1 tablet (5 mg total) by mouth daily. 12/18/15   Brunetta Jeans, PA-C  traMADol (ULTRAM) 50 MG tablet TAKE ONE TABLET BY MOUTH EVERY 12 HOURS AS NEEDED 02/25/16   Brunetta Jeans, PA-C   Triage Vitals: BP 157/82 mmHg  Pulse 90  Temp(Src) 98.2 F (36.8 C) (Oral)  Resp 18  Ht 6\' 4"  (1.93 m)  Wt 270 lb (122.471 kg)  BMI 32.88 kg/m2  SpO2 99% Physical Exam  Constitutional: He is oriented to person, place, and time. He appears well-developed and well-nourished.  HENT:  Head: Normocephalic and atraumatic.  Eyes: Conjunctivae are normal. Pupils are equal, round, and reactive to light. Right eye exhibits no discharge. Left eye exhibits no discharge.  Neck: Normal range of motion. Neck supple. No JVD present.  Cardiovascular: Normal rate, regular rhythm and normal heart sounds.   No murmur heard. Pulmonary/Chest: Effort normal and breath sounds normal. He has no wheezes. He has no rales. He exhibits no tenderness.  Abdominal: Soft. He exhibits no distension and  no mass. There is tenderness. There is no rebound and no guarding.  Mild to moderate epigastric tenderness   Musculoskeletal: Normal range of motion. He exhibits no edema.  Lymphadenopathy:    He has no cervical adenopathy.  Neurological: He is alert and oriented to person, place, and time. No cranial nerve deficit. He exhibits normal muscle tone. Coordination normal.  Skin: Skin is warm. No rash noted.  Psychiatric:  He has a normal mood and affect. His behavior is normal. Judgment and thought content normal.  Nursing note and vitals reviewed.   ED Course  Procedures  DIAGNOSTIC STUDIES:  Oxygen Saturation is 99% on RA, normal by my interpretation.    COORDINATION OF CARE:  12:23 AM Discussed treatment plan, which includes GI cocktail and Protonics with pt at bedside and pt agreed to plan.   Pt recheck:  1:50 AM Pt reports no relief with GI cocktail. Will give him morphine and zofran.   Labs Review Results for orders placed or performed during the hospital encounter of 03/12/16  CBC with Differential  Result Value Ref Range   WBC 7.2 4.0 - 10.5 K/uL   RBC 5.03 4.22 - 5.81 MIL/uL   Hemoglobin 12.1 (L) 13.0 - 17.0 g/dL   HCT 38.8 (L) 39.0 - 52.0 %   MCV 77.1 (L) 78.0 - 100.0 fL   MCH 24.1 (L) 26.0 - 34.0 pg   MCHC 31.2 30.0 - 36.0 g/dL   RDW 14.9 11.5 - 15.5 %   Platelets 347 150 - 400 K/uL   Neutrophils Relative % 60 %   Neutro Abs 4.3 1.7 - 7.7 K/uL   Lymphocytes Relative 32 %   Lymphs Abs 2.3 0.7 - 4.0 K/uL   Monocytes Relative 6 %   Monocytes Absolute 0.5 0.1 - 1.0 K/uL   Eosinophils Relative 2 %   Eosinophils Absolute 0.1 0.0 - 0.7 K/uL   Basophils Relative 0 %   Basophils Absolute 0.0 0.0 - 0.1 K/uL  Comprehensive metabolic panel  Result Value Ref Range   Sodium 135 135 - 145 mmol/L   Potassium 3.9 3.5 - 5.1 mmol/L   Chloride 102 101 - 111 mmol/L   CO2 24 22 - 32 mmol/L   Glucose, Bld 190 (H) 65 - 99 mg/dL   BUN 8 6 - 20 mg/dL   Creatinine, Ser 0.80 0.61 -  1.24 mg/dL   Calcium 9.0 8.9 - 10.3 mg/dL   Total Protein 6.9 6.5 - 8.1 g/dL   Albumin 3.7 3.5 - 5.0 g/dL   AST 26 15 - 41 U/L   ALT 30 17 - 63 U/L   Alkaline Phosphatase 66 38 - 126 U/L   Total Bilirubin 0.3 0.3 - 1.2 mg/dL   GFR calc non Af Amer >60 >60 mL/min   GFR calc Af Amer >60 >60 mL/min   Anion gap 9 5 - 15  Lipase, blood  Result Value Ref Range   Lipase 112 (H) 11 - 51 U/L  Urinalysis, Routine w reflex microscopic (not at [facility])  Result Value Ref Range   Color, Urine YELLOW YELLOW   APPearance CLEAR CLEAR   Specific Gravity, Urine 1.014 1.005 - 1.030   pH 7.0 5.0 - 8.0   Glucose, UA NEGATIVE NEGATIVE mg/dL   Hgb urine dipstick NEGATIVE NEGATIVE   Bilirubin Urine NEGATIVE NEGATIVE   Ketones, ur NEGATIVE NEGATIVE mg/dL   Protein, ur NEGATIVE NEGATIVE mg/dL   Nitrite NEGATIVE NEGATIVE   Leukocytes, UA NEGATIVE NEGATIVE    Imaging Review US Abdomen Complete  03/13/2016  CLINICAL DATA:  Abdominal pain. EXAM: ABDOMEN ULTRASOUND COMPLETE COMPARISON:  Ultrasound 07/26/2015 FINDINGS: Gallbladder: Physiologically distended. No gallstones or wall thickening visualized. No sonographic Murphy sign noted by sonographer. Common bile duct: Diameter: 3.5 mm Liver: No focal lesion identified. Diffusely increased and heterogeneous in parenchymal echogenicity. Minimal fatty sparing adjacent the gallbladder fossa. Normal directional flow in the main portal vein. IVC: No  abnormality visualized. Pancreas: Visualized portion unremarkable. Spleen: Size and appearance within normal limits. Right Kidney: Length: 12.3 cm. Echogenicity within normal limits. No mass or hydronephrosis visualized. Left Kidney: Length: 12.3 cm. Echogenicity within normal limits. No mass or hydronephrosis visualized. Abdominal aorta: Not well visualized due to body habitus. Portions seen is upper limits of normal measuring 3 cm. Other findings: None. IMPRESSION: 1. Hepatic steatosis. 2. Otherwise unremarkable abdominal  ultrasound. Electronically Signed   By: Jeb Levering M.D.   On: 03/13/2016 03:37   Ct Abdomen Pelvis W Contrast  03/13/2016  CLINICAL DATA:  Upper abdominal pain. EXAM: CT ABDOMEN AND PELVIS WITH CONTRAST TECHNIQUE: Multidetector CT imaging of the abdomen and pelvis was performed using the standard protocol following bolus administration of intravenous contrast. CONTRAST:  16mL ISOVUE-300 IOPAMIDOL (ISOVUE-300) INJECTION 61% COMPARISON:  Right upper quadrant ultrasound earlier this day. CT 10/16/2012 FINDINGS: Lower chest:  The included lung bases are clear. Liver: Decreased density consistent with steatosis. Enlarged measuring 25 cm cranial caudal. No focal lesion. Hepatobiliary: Gallbladder physiologically distended, no calcified stone. No biliary dilatation. Pancreas: Faint soft tissue stranding in the upper retroperitoneum without the pancreatic head and duodenum. Spleen: Normal.  Small splenule superiorly. Adrenal glands: No nodule. Kidneys: Symmetric renal enhancement. No hydronephrosis. Symmetric renal excretion. Symmetric bilateral perinephric stranding. Stomach/Bowel: Stomach physiologically distended. Small hiatal hernia. There are no dilated or thickened small bowel loops. Small-moderate volume of stool throughout the colon without colonic wall thickening. The appendix is normal. Vascular/Lymphatic: No retroperitoneal adenopathy. Abdominal aorta is normal in caliber. Minimal atherosclerosis of the distal abdominal aorta. Reproductive: Prominent sized prostate gland measuring 5.1 cm transverse. Bladder: Minimally distended.  Equivocal wall thickening. Other: No free air, free fluid, or intra-abdominal fluid collection. Musculoskeletal: There are no acute or suspicious osseous abnormalities. IMPRESSION: 1. Faint soft tissue stranding adjacent to pancreatic head and duodenum, may reflect minimal acute pancreatitis or less likely duodenitis given lack of duodenal wall thickening. 2. Small hiatal  hernia. 3. Hepatomegaly and hepatic steatosis. Electronically Signed   By: Jeb Levering M.D.   On: 03/13/2016 06:15   I have personally reviewed and evaluated these images and lab results as part of my medical decision-making.    MDM   Final diagnoses:  Acute pancreatitis, unspecified pancreatitis type    Abdominal pain of uncertain cause. Tenderness seems to be localized to right upper quadrant and epigastric area. He is given a trial of GI cocktail with no relief. He is sent for ultrasound of the abdomen is given a dose of morphine. Ultrasound came back unremarkable. He is noted to have mildly elevated lipase. He is sent for CT of abdomen and pelvis which is consistent with mild pancreatitis. He did require additional dose of morphine but morphine gave him good relief. He is not vomiting and I feel he would be a good candidate for outpatient management. I discussed this with the patient and he is in agreement. He is discharged with prescriptions for oxycodone have acetaminophen and ondansetron and is referred back to his PCP. Return to the ED if outpatient medication is not effective. Old records reviewed and he has no similar past visits. CT of abdomen and pelvis in January 2014 was negative.  I personally performed the services described in this documentation, which was scribed in my presence. The recorded information has been reviewed and is accurate.      Delora Fuel, MD [id number]

## 2016-03-17 ENCOUNTER — Ambulatory Visit (INDEPENDENT_AMBULATORY_CARE_PROVIDER_SITE_OTHER): Payer: BLUE CROSS/BLUE SHIELD | Admitting: Physician Assistant

## 2016-03-17 ENCOUNTER — Encounter: Payer: Self-pay | Admitting: Physician Assistant

## 2016-03-17 VITALS — BP 119/74 | HR 85 | Temp 98.2°F | Resp 16 | Ht 76.0 in | Wt 276.0 lb

## 2016-03-17 DIAGNOSIS — K859 Acute pancreatitis without necrosis or infection, unspecified: Secondary | ICD-10-CM

## 2016-03-17 LAB — COMPREHENSIVE METABOLIC PANEL
ALBUMIN: 4.2 g/dL (ref 3.5–5.2)
ALK PHOS: 65 U/L (ref 39–117)
ALT: 36 U/L (ref 0–53)
AST: 29 U/L (ref 0–37)
BILIRUBIN TOTAL: 0.4 mg/dL (ref 0.2–1.2)
BUN: 10 mg/dL (ref 6–23)
CO2: 26 mEq/L (ref 19–32)
Calcium: 9.4 mg/dL (ref 8.4–10.5)
Chloride: 101 mEq/L (ref 96–112)
Creatinine, Ser: 0.74 mg/dL (ref 0.40–1.50)
GFR: 117.62 mL/min (ref >60.00)
Glucose, Bld: 116 mg/dL — ABNORMAL HIGH (ref 70–99)
POTASSIUM: 4 meq/L (ref 3.5–5.1)
SODIUM: 136 meq/L (ref 135–145)
TOTAL PROTEIN: 7.1 g/dL (ref 6.0–8.3)

## 2016-03-17 LAB — LIPID PANEL
Cholesterol: 183 mg/dL (ref 0–200)
HDL: 37.4 mg/dL — AB (ref >39.00)
Total CHOL/HDL Ratio: 5
Triglycerides: 442 mg/dL — ABNORMAL HIGH (ref 0.0–149.0)

## 2016-03-17 LAB — LDL CHOLESTEROL, DIRECT: Direct LDL: 75 mg/dL

## 2016-03-17 LAB — LIPASE: LIPASE: 25 U/L (ref 11.0–59.0)

## 2016-03-17 NOTE — Progress Notes (Signed)
Pre visit review using our clinic review tool, if applicable. No additional management support is needed unless otherwise documented below in the visit note/SLS  

## 2016-03-17 NOTE — Progress Notes (Signed)
Patient presents to clinic today for ER follow-up of acute pancreatitis. Was seen in the ER on 03/12/16 for epigastric pain. Workup including labs remarkable for lipase at 112. US revealed hepatic steatosis but no other abnormal findings. CT Abdomen pelvis performed revealing pancreatic inflammation and hepatomegaly without other abnormal/worrisome findings. Was given morphine in the ER with resolution of pain.   Since discharge, patient endorses doing well overall. Denies fever, chills, nausea or vomiting. Some mild pain but only with eating. Has tried to resume normal diet without success. Tolerating fluids well. Denies change to bowel or bladder habits.Endorses fasting sugars averaging 170s over the past couple of weeks. Has stopped intake of sweets and watching carbohydrates. Is taking diabetes medications as directed. Would like to discuss invokana as a possible medication option.  Past Medical History  Diagnosis Date  . Diabetes mellitus without complication ([place])   . Hypertension   . Hyperlipidemia   . Complication of anesthesia     has night terrors after anesthesia but can be violent when waking up  . 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 Pt stated "I forget alot of things"  . Frequent urination at night     when blood sugar runs high  . Psoriasis of scalp     nose and face; occurs mainly in winter   . Seasonal allergies   . CAD (coronary artery disease)   . Sinus infection 12/16/13  . ADHD (attention deficit hyperactivity disorder)   . Allergy   . Depression   . Anxiety     Current Outpatient Prescriptions on File Prior to Visit  Medication Sig Dispense Refill  . atorvastatin (LIPITOR) 10 MG tablet Take 1 tablet (10 mg total) by mouth daily. 30 tablet 5  . gabapentin (NEURONTIN) 300 MG capsule Take 600 mg each AM, 300 mg noon and 600 mg PM. 450 capsule 1  . insulin lispro (HUMALOG KWIKPEN) 100 UNIT/ML KiwkPen Inject 0-12 Units into the skin 3 (three) times  daily with meals. sliding scale- check sugar and inject 3 times daily before meals as below: <150- Zero units 150-200 2 units 201-250 4 units 251-300 6 units 301-350 8 units 351-400 10 units >400 12 units and contact us. 15 mL 11  . insulin NPH Human (HUMULIN N,NOVOLIN N) 100 UNIT/ML injection 30 units in the morning and 30-40 units at bedtime. 10 mL 2  . lisinopril (PRINIVIL,ZESTRIL) 5 MG tablet Take 1 tablet (5 mg total) by mouth daily. 30 tablet 6  . metFORMIN (GLUCOPHAGE) 1000 MG tablet Take 1 tablet (1,000 mg total) by mouth 2 (two) times daily with a meal. No further refills until follow-up. Schedule a visit. 180 tablet 0  . Multiple Vitamins-Minerals (ZINC PO) Take 1 tablet by mouth daily.     . ondansetron (ZOFRAN) 4 MG tablet Take 1 tablet (4 mg total) by mouth every 6 (six) hours as needed for nausea or vomiting. 12 tablet 0  . oxyCODONE-acetaminophen (PERCOCET) 5-325 MG tablet Take 1 tablet by mouth every 4 (four) hours as needed for moderate pain. 20 tablet 0  . saxagliptin HCl (ONGLYZA) 5 MG TABS tablet Take 1 tablet (5 mg total) by mouth daily. 30 tablet 1  . traMADol (ULTRAM) 50 MG tablet TAKE ONE TABLET BY MOUTH EVERY 12 HOURS AS NEEDED 60 tablet 0   No current facility-administered medications on file prior to visit.    Allergies  Allergen Reactions  . Sulfa Antibiotics Itching and Other (  See Comments)    Burning also    Family History  Problem Relation Age of Onset  . Diabetes Mother   . Cancer Mother     history breast cancer  . Hypertension Mother   . Cancer Father     prostate  . Alzheimer's disease Father   . Diabetes Father   . Hypertension Father   . Cancer Maternal Uncle     colon  . Colon cancer Maternal Uncle 43  . Cancer Maternal Grandmother     breast  . Cancer Maternal Grandfather     prostate  . Cancer Maternal Uncle     prostate  . Colon cancer Maternal Uncle 40  . Cancer Maternal Uncle     prostate  . Cancer Cousin 47    metastatic colon  cancer?  . Colon polyps Paternal Uncle   . Esophageal cancer Neg Hx   . Stomach cancer Neg Hx   . Rectal cancer Neg Hx     Social History   Social History  . Marital Status: Married    Spouse Name: N/A  . Number of Children: 6  . Years of Education: N/A   Social History Main Topics  . Smoking status: Never Smoker   . Smokeless tobacco: Never Used  . Alcohol Use: 0.6 - 1.2 oz/week    1-2 Cans of beer per week     Comment: rare beer  . Drug Use: No  . Sexual Activity:    Partners: Female   Other Topics Concern  . None   Social History Narrative   Regular exercise: very little   Caffeine use: sweet tea daily   6 children   Works in [employer], owns concession.   Married   Enjoys televition.      Review of Systems - See HPI.  All other ROS are negative.  BP 119/74 mmHg  Pulse 85  Temp(Src) 98.2 F (36.8 C) (Oral)  Resp 16  Ht _0  (1.93 m)  Wt 276 lb (125.193 kg)  BMI 33.61 kg/m2  SpO2 97%  Physical Exam  Constitutional: He is oriented to person, place, and time and well-developed, well-nourished, and in no distress.  HENT:  Head: Normocephalic and atraumatic.  Eyes: Conjunctivae are normal.  Cardiovascular: Normal rate, regular rhythm, normal heart sounds and intact distal pulses.   Pulmonary/Chest: Effort normal and breath sounds normal. No respiratory distress. He has no wheezes. He has no rales. He exhibits no tenderness.  Abdominal: Soft. Bowel sounds are normal. He exhibits no mass. There is no rebound and no guarding.  Mild epigastric tenderness to palpation  Neurological: He is alert and oriented to person, place, and time.  Skin: Skin is warm and dry. No rash noted.  Psychiatric: Affect normal.  Vitals reviewed.   Recent Results (from the past 2160 hour(s))  Urinalysis, Routine w reflex microscopic (not at [facility])     Status: None   Collection Time: 03/12/16 10:31 PM  Result Value Ref Range   Color, Urine YELLOW YELLOW   APPearance CLEAR  CLEAR   Specific Gravity, Urine 1.014 1.005 - 1.030   pH 7.0 5.0 - 8.0   Glucose, UA NEGATIVE NEGATIVE mg/dL   Hgb urine dipstick NEGATIVE NEGATIVE   Bilirubin Urine NEGATIVE NEGATIVE   Ketones, ur NEGATIVE NEGATIVE mg/dL   Protein, ur NEGATIVE NEGATIVE mg/dL   Nitrite NEGATIVE NEGATIVE   Leukocytes, UA NEGATIVE NEGATIVE    Comment: MICROSCOPIC NOT DONE ON URINES WITH NEGATIVE PROTEIN, BLOOD, LEUKOCYTES, NITRITE,  OR GLUCOSE <1000 mg/dL.  CBC with Differential     Status: Abnormal   Collection Time: 03/12/16 10:39 PM  Result Value Ref Range   WBC 7.2 4.0 - 10.5 K/uL   RBC 5.03 4.22 - 5.81 MIL/uL   Hemoglobin 12.1 (L) 13.0 - 17.0 g/dL   HCT 38.8 (L) 39.0 - 52.0 %   MCV 77.1 (L) 78.0 - 100.0 fL   MCH 24.1 (L) 26.0 - 34.0 pg   MCHC 31.2 30.0 - 36.0 g/dL   RDW 14.9 11.5 - 15.5 %   Platelets 347 150 - 400 K/uL   Neutrophils Relative % 60 %   Neutro Abs 4.3 1.7 - 7.7 K/uL   Lymphocytes Relative 32 %   Lymphs Abs 2.3 0.7 - 4.0 K/uL   Monocytes Relative 6 %   Monocytes Absolute 0.5 0.1 - 1.0 K/uL   Eosinophils Relative 2 %   Eosinophils Absolute 0.1 0.0 - 0.7 K/uL   Basophils Relative 0 %   Basophils Absolute 0.0 0.0 - 0.1 K/uL  Comprehensive metabolic panel     Status: Abnormal   Collection Time: 03/12/16 10:39 PM  Result Value Ref Range   Sodium 135 135 - 145 mmol/L   Potassium 3.9 3.5 - 5.1 mmol/L   Chloride 102 101 - 111 mmol/L   CO2 24 22 - 32 mmol/L   Glucose, Bld 190 (H) 65 - 99 mg/dL   BUN 8 6 - 20 mg/dL   Creatinine, Ser 0.80 0.61 - 1.24 mg/dL   Calcium 9.0 8.9 - 10.3 mg/dL   Total Protein 6.9 6.5 - 8.1 g/dL   Albumin 3.7 3.5 - 5.0 g/dL   AST 26 15 - 41 U/L   ALT 30 17 - 63 U/L   Alkaline Phosphatase 66 38 - 126 U/L   Total Bilirubin 0.3 0.3 - 1.2 mg/dL   GFR calc non Af Amer >60 >60 mL/min   GFR calc Af Amer >60 >60 mL/min    Comment: (NOTE) The eGFR has been calculated using the CKD EPI equation. This calculation has not been validated in all clinical  situations. eGFR's persistently <60 mL/min signify possible Chronic Kidney Disease.    Anion gap 9 5 - 15  Lipase, blood     Status: Abnormal   Collection Time: 03/12/16 10:39 PM  Result Value Ref Range   Lipase 112 (H) 11 - 51 U/L    Assessment/Plan: 1. Acute pancreatitis, unspecified pancreatitis type Improving. Still pain with solid foods. Will repeat CMP, lipase and lipids today to assess TGL. Dietary measures and pain management reviewed. FU 1 week. Will discuss diabetic medication changes at that time. ER if anything worsens. - Comp Met (CMET) - Lipase - Lipid panel

## 2016-03-17 NOTE — Patient Instructions (Signed)
Please go to the lab for blood work. I will call you with your results. Please continue medications as directed. Follow diet below to help with overall intake to prevent recurrence. For now -- low fat, los sugar diet. Continue with liquids and soft foods. Can restart solids once tolerating soft foods without pain. If anything worsens, please go to the ER as you may need inpatient treatment.  Follow-up with me in 1 week.   Low-Fat Diet for Pancreatitis or Gallbladder Conditions A low-fat diet can be helpful if you have pancreatitis or a gallbladder condition. With these conditions, your pancreas and gallbladder have trouble digesting fats. A healthy eating plan with less fat will help rest your pancreas and gallbladder and reduce your symptoms. WHAT DO I NEED TO KNOW ABOUT THIS DIET?  Eat a low-fat diet.  Reduce your fat intake to less than 20-30% of your total daily calories. This is less than 50-60 g of fat per day.  Remember that you need some fat in your diet. Ask your dietician what your daily goal should be.  Choose nonfat and low-fat healthy foods. Look for the words "nonfat," "low fat," or "fat free."  As a guide, look on the label and choose foods with less than 3 g of fat per serving. Eat only one serving.  Avoid alcohol.  Do not smoke. If you need help quitting, talk with your health care provider.  Eat small frequent meals instead of three large heavy meals. WHAT FOODS CAN I EAT? Grains Include healthy grains and starches such as potatoes, wheat bread, fiber-rich cereal, and brown rice. Choose whole grain options whenever possible. In adults, whole grains should account for 45-65% of your daily calories.  Fruits and Vegetables Eat plenty of fruits and vegetables. Fresh fruits and vegetables add fiber to your diet. Meats and Other Protein Sources Eat lean meat such as chicken and pork. Trim any fat off of meat before cooking it. Eggs, fish, and beans are other sources of  protein. In adults, these foods should account for 10-35% of your daily calories. Dairy Choose low-fat milk and dairy options. Dairy includes fat and protein, as well as calcium.  Fats and Oils Limit high-fat foods such as fried foods, sweets, baked goods, sugary drinks.  Other Creamy sauces and condiments, such as mayonnaise, can add extra fat. Think about whether or not you need to use them, or use smaller amounts or low fat options. WHAT FOODS ARE NOT RECOMMENDED?  High fat foods, such as:  [employer].  Ice cream.  [place] toast.  Sweet rolls.  Pizza.  Cheese bread.  Foods covered with batter, butter, creamy sauces, or cheese.  Fried foods.  Sugary drinks and desserts.  Foods that cause gas or bloating   This information is not intended to replace advice given to you by your health care provider. Make sure you discuss any questions you have with your health care provider.   Document Released: 10/04/2013 Document Reviewed: 10/04/2013 Elsevier Interactive Patient Education [employer].

## 2016-03-19 ENCOUNTER — Telehealth: Payer: Self-pay | Admitting: *Deleted

## 2016-03-19 MED ORDER — FENOFIBRATE 145 MG PO TABS: 145.0000 mg | ORAL_TABLET | Freq: Every day | ORAL | Status: DC

## 2016-03-19 NOTE — Telephone Encounter (Signed)
Patient informed, understood & agreed; new Rx to pharmacy/[facility] 06/07

## 2016-03-19 NOTE — Telephone Encounter (Signed)
-----   Message from Brunetta Jeans, PA-C sent at 03/18/2016  8:49 AM EDT ----- Labs look good overall. Glucose level was much improved. Cholesterol looks great but TGl elevated to 400s. Want him to continue Lipitor as directed. Want to start fenofibrate 145 mcg daily. Ok to send in month supply with 1 refill. He needs to start low fat, low sugar diet. Increase exercise. His lipase had returned to normal so pancreatitis is resolving. Pain should be resolving with it. FU as scheduled in 1 week. Will discuss the Invokana with him further at follow-up

## 2016-03-24 ENCOUNTER — Telehealth: Payer: Self-pay | Admitting: *Deleted

## 2016-03-24 ENCOUNTER — Encounter: Payer: Self-pay | Admitting: Physician Assistant

## 2016-03-24 ENCOUNTER — Ambulatory Visit (INDEPENDENT_AMBULATORY_CARE_PROVIDER_SITE_OTHER): Payer: BLUE CROSS/BLUE SHIELD | Admitting: Physician Assistant

## 2016-03-24 VITALS — BP 134/80 | HR 99 | Temp 98.1°F | Resp 16 | Ht 76.0 in | Wt 277.4 lb

## 2016-03-24 DIAGNOSIS — E119 Type 2 diabetes mellitus without complications: Secondary | ICD-10-CM | POA: Diagnosis not present

## 2016-03-24 DIAGNOSIS — R5383 Other fatigue: Secondary | ICD-10-CM

## 2016-03-24 DIAGNOSIS — I10 Essential (primary) hypertension: Secondary | ICD-10-CM

## 2016-03-24 DIAGNOSIS — Z794 Long term (current) use of insulin: Secondary | ICD-10-CM | POA: Diagnosis not present

## 2016-03-24 LAB — HEMOGLOBIN A1C: HEMOGLOBIN A1C: 9.1 % — AB (ref 4.6–6.5)

## 2016-03-24 LAB — VITAMIN B12: VITAMIN B 12: 176 pg/mL — AB (ref 211–911)

## 2016-03-24 MED ORDER — METFORMIN HCL 1000 MG PO TABS: 1000.0000 mg | ORAL_TABLET | Freq: Two times a day (BID) | ORAL | Status: DC

## 2016-03-24 MED ORDER — OXYCODONE-ACETAMINOPHEN 5-325 MG PO TABS: 1.0000 | ORAL_TABLET | ORAL | Status: DC | PRN

## 2016-03-24 MED ORDER — CANAGLIFLOZIN 100 MG PO TABS: 100.0000 mg | ORAL_TABLET | Freq: Every day | ORAL | Status: DC

## 2016-03-24 MED ORDER — LISINOPRIL 5 MG PO TABS: 5.0000 mg | ORAL_TABLET | Freq: Every day | ORAL | Status: AC

## 2016-03-24 MED ORDER — TRAMADOL HCL 50 MG PO TABS: 50.0000 mg | ORAL_TABLET | Freq: Two times a day (BID) | ORAL | Status: DC | PRN

## 2016-03-24 MED ORDER — INSULIN NPH (HUMAN) (ISOPHANE) 100 UNIT/ML ~~LOC~~ SUSP: SUBCUTANEOUS | Status: DC

## 2016-03-24 MED ORDER — SAXAGLIPTIN HCL 5 MG PO TABS: 5.0000 mg | ORAL_TABLET | Freq: Every day | ORAL | Status: DC

## 2016-03-24 MED ORDER — INSULIN LISPRO 100 UNIT/ML (KWIKPEN): PEN_INJECTOR | SUBCUTANEOUS | Status: DC

## 2016-03-24 NOTE — Patient Instructions (Signed)
Please go to the lab for blood work. I will call with results. Please start the invokana taking 1/2 tablet daily.  Continue other medications as directed.  If fasting sugars are <100, decrease your long-acting insulin by 4 units.  Follow-up with me in 1 month.

## 2016-03-24 NOTE — Telephone Encounter (Signed)
PA approved effective from 03/24/2016 through 10/12/2038. JG//CMA

## 2016-03-24 NOTE — Progress Notes (Signed)
Patient presents to clinic today for 2nd follow-up after acute pancreatitis. Last appointment patient was still noting pain with meals. Was staying hydrated. Lipase checked and normal at that visit. Patient instructed to resume full liquid diet, transitioning first to soft foods and then regular diet as tolerated without pain. Patient denies any residual abdominal pain, nausea or vomiting. Is staying well hydrated and tolerating a regular diet. Endorses normal BM.  Of note TGL checked at last visit in the 400s. Was started on fenofibrate 145 mg daily. Is taking as directed without side effects. Is working on diet and exercise. Fasting sugars are still averaging in the 180-200 rance despite consistency with medication and diet. Had discuss potentially starting Invokana at last visit.  Past Medical History  Diagnosis Date  . Diabetes mellitus without complication ([place])   . Hypertension   . Hyperlipidemia   . Complication of anesthesia     has night terrors after anesthesia but can be violent when waking up  . 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 Pt stated "I forget alot of things"  . Frequent urination at night     when blood sugar runs high  . Psoriasis of scalp     nose and face; occurs mainly in winter   . Seasonal allergies   . CAD (coronary artery disease)   . Sinus infection 12/16/13  . ADHD (attention deficit hyperactivity disorder)   . Allergy   . Depression   . Anxiety     Current Outpatient Prescriptions on File Prior to Visit  Medication Sig Dispense Refill  . fenofibrate (TRICOR) 145 MG tablet Take 1 tablet (145 mg total) by mouth daily. 30 tablet 1  . gabapentin (NEURONTIN) 300 MG capsule Take 600 mg each AM, 300 mg noon and 600 mg PM. 450 capsule 1  . insulin lispro (HUMALOG KWIKPEN) 100 UNIT/ML KiwkPen Inject 0-12 Units into the skin 3 (three) times daily with meals. sliding scale- check sugar and inject 3 times daily before meals as below: <150- Zero  units 150-200 2 units 201-250 4 units 251-300 6 units 301-350 8 units 351-400 10 units >400 12 units and contact us. 15 mL 11  . insulin NPH Human (HUMULIN N,NOVOLIN N) 100 UNIT/ML injection 30 units in the morning and 30-40 units at bedtime. 10 mL 2  . lisinopril (PRINIVIL,ZESTRIL) 5 MG tablet Take 1 tablet (5 mg total) by mouth daily. 30 tablet 6  . metFORMIN (GLUCOPHAGE) 1000 MG tablet Take 1 tablet (1,000 mg total) by mouth 2 (two) times daily with a meal. No further refills until follow-up. Schedule a visit. 180 tablet 0  . Multiple Vitamins-Minerals (ZINC PO) Take 1 tablet by mouth daily.     Marland Kitchen oxyCODONE-acetaminophen (PERCOCET) 5-325 MG tablet Take 1 tablet by mouth every 4 (four) hours as needed for moderate pain. 20 tablet 0  . saxagliptin HCl (ONGLYZA) 5 MG TABS tablet Take 1 tablet (5 mg total) by mouth daily. 30 tablet 1  . traMADol (ULTRAM) 50 MG tablet TAKE ONE TABLET BY MOUTH EVERY 12 HOURS AS NEEDED 60 tablet 0  . atorvastatin (LIPITOR) 10 MG tablet Take 1 tablet (10 mg total) by mouth daily. (Patient not taking: Reported on 03/24/2016) 30 tablet 5   No current facility-administered medications on file prior to visit.    Allergies  Allergen Reactions  . Sulfa Antibiotics Itching and Other (See Comments)    Burning also    Family History  Problem Relation Age of Onset  . Diabetes Mother   . Cancer Mother     history breast cancer  . Hypertension Mother   . Cancer Father     prostate  . Alzheimer's disease Father   . Diabetes Father   . Hypertension Father   . Cancer Maternal Uncle     colon  . Colon cancer Maternal Uncle 41  . Cancer Maternal Grandmother     breast  . Cancer Maternal Grandfather     prostate  . Cancer Maternal Uncle     prostate  . Colon cancer Maternal Uncle 75  . Cancer Maternal Uncle     prostate  . Cancer Cousin 47    metastatic colon cancer?  . Colon polyps Paternal Uncle   . Esophageal cancer Neg Hx   . Stomach cancer Neg Hx   .  Rectal cancer Neg Hx     Social History   Social History  . Marital Status: Married    Spouse Name: N/A  . Number of Children: 6  . Years of Education: N/A   Social History Main Topics  . Smoking status: Never Smoker   . Smokeless tobacco: Never Used  . Alcohol Use: 0.6 - 1.2 oz/week    1-2 Cans of beer per week     Comment: rare beer  . Drug Use: No  . Sexual Activity:    Partners: Female   Other Topics Concern  . None   Social History Narrative   Regular exercise: very little   Caffeine use: sweet tea daily   6 children   Works in Architect, owns concession.   Married   Enjoys televition.     Review of Systems - See HPI.  All other ROS are negative.  BP 134/80 mmHg  Pulse 99  Temp(Src) 98.1 F (36.7 C) (Oral)  Resp 16  Ht 6' 4" (1.93 m)  Wt 277 lb 6 oz (125.816 kg)  BMI 33.78 kg/m2  Physical Exam  Constitutional: He is oriented to person, place, and time and well-developed, well-nourished, and in no distress.  HENT:  Head: Normocephalic and atraumatic.  Cardiovascular: Normal rate, regular rhythm, normal heart sounds and intact distal pulses.   Pulmonary/Chest: Effort normal and breath sounds normal. No respiratory distress. He has no wheezes. He has no rales. He exhibits no tenderness.  Abdominal: Soft. Bowel sounds are normal. He exhibits no distension. There is no tenderness.  Neurological: He is alert and oriented to person, place, and time.  Skin: Skin is warm and dry. No rash noted.  Psychiatric: Affect normal.  Vitals reviewed.   Recent Results (from the past 2160 hour(s))  Urinalysis, Routine w reflex microscopic (not at [facility])     Status: None   Collection Time: 03/12/16 10:31 PM  Result Value Ref Range   Color, Urine YELLOW YELLOW   APPearance CLEAR CLEAR   Specific Gravity, Urine 1.014 1.005 - 1.030   pH 7.0 5.0 - 8.0   Glucose, UA NEGATIVE NEGATIVE mg/dL   Hgb urine dipstick NEGATIVE NEGATIVE   Bilirubin Urine NEGATIVE NEGATIVE    Ketones, ur NEGATIVE NEGATIVE mg/dL   Protein, ur NEGATIVE NEGATIVE mg/dL   Nitrite NEGATIVE NEGATIVE   Leukocytes, UA NEGATIVE NEGATIVE    Comment: MICROSCOPIC NOT DONE ON URINES WITH NEGATIVE PROTEIN, BLOOD, LEUKOCYTES, NITRITE, OR GLUCOSE <1000 mg/dL.  CBC with Differential     Status: Abnormal   Collection Time: 03/12/16 10:39 PM  Result Value Ref Range   WBC  7.2 4.0 - 10.5 K/uL   RBC 5.03 4.22 - 5.81 MIL/uL   Hemoglobin 12.1 (L) 13.0 - 17.0 g/dL   HCT 38.8 (L) 39.0 - 52.0 %   MCV 77.1 (L) 78.0 - 100.0 fL   MCH 24.1 (L) 26.0 - 34.0 pg   MCHC 31.2 30.0 - 36.0 g/dL   RDW 14.9 11.5 - 15.5 %   Platelets 347 150 - 400 K/uL   Neutrophils Relative % 60 %   Neutro Abs 4.3 1.7 - 7.7 K/uL   Lymphocytes Relative 32 %   Lymphs Abs 2.3 0.7 - 4.0 K/uL   Monocytes Relative 6 %   Monocytes Absolute 0.5 0.1 - 1.0 K/uL   Eosinophils Relative 2 %   Eosinophils Absolute 0.1 0.0 - 0.7 K/uL   Basophils Relative 0 %   Basophils Absolute 0.0 0.0 - 0.1 K/uL  Comprehensive metabolic panel     Status: Abnormal   Collection Time: 03/12/16 10:39 PM  Result Value Ref Range   Sodium 135 135 - 145 mmol/L   Potassium 3.9 3.5 - 5.1 mmol/L   Chloride 102 101 - 111 mmol/L   CO2 24 22 - 32 mmol/L   Glucose, Bld 190 (H) 65 - 99 mg/dL   BUN 8 6 - 20 mg/dL   Creatinine, Ser 0.80 0.61 - 1.24 mg/dL   Calcium 9.0 8.9 - 10.3 mg/dL   Total Protein 6.9 6.5 - 8.1 g/dL   Albumin 3.7 3.5 - 5.0 g/dL   AST 26 15 - 41 U/L   ALT 30 17 - 63 U/L   Alkaline Phosphatase 66 38 - 126 U/L   Total Bilirubin 0.3 0.3 - 1.2 mg/dL   GFR calc non Af Amer >60 >60 mL/min   GFR calc Af Amer >60 >60 mL/min    Comment: (NOTE) The eGFR has been calculated using the CKD EPI equation. This calculation has not been validated in all clinical situations. eGFR's persistently <60 mL/min signify possible Chronic Kidney Disease.    Anion gap 9 5 - 15  Lipase, blood     Status: Abnormal   Collection Time: 03/12/16 10:39 PM  Result Value  Ref Range   Lipase 112 (H) 11 - 51 U/L  Comp Met (CMET)     Status: Abnormal   Collection Time: 03/17/16 12:04 PM  Result Value Ref Range   Sodium 136 135 - 145 mEq/L   Potassium 4.0 3.5 - 5.1 mEq/L   Chloride 101 96 - 112 mEq/L   CO2 26 19 - 32 mEq/L   Glucose, Bld 116 (H) 70 - 99 mg/dL   BUN 10 6 - 23 mg/dL   Creatinine, Ser 0.74 0.40 - 1.50 mg/dL   Total Bilirubin 0.4 0.2 - 1.2 mg/dL   Alkaline Phosphatase 65 39 - 117 U/L   AST 29 0 - 37 U/L   ALT 36 0 - 53 U/L   Total Protein 7.1 6.0 - 8.3 g/dL   Albumin 4.2 3.5 - 5.2 g/dL   Calcium 9.4 8.4 - 10.5 mg/dL   GFR 117.62 >60.00 mL/min  Lipase     Status: None   Collection Time: 03/17/16 12:04 PM  Result Value Ref Range   Lipase 25.0 11.0 - 59.0 U/L  Lipid panel     Status: Abnormal   Collection Time: 03/17/16 12:04 PM  Result Value Ref Range   Cholesterol 183 0 - 200 mg/dL    Comment: ATP III Classification       Desirable:  <  200 mg/dL               Borderline High:  200 - 239 mg/dL          High:  > = 240 mg/dL   Triglycerides (H) 0.0 - 149.0 mg/dL    442.0 Triglyceride is over 400; calculations on Lipids are invalid.    Comment: Normal:  <150 mg/dLBorderline High:  150 - 199 mg/dL   HDL 37.40 (L) >39.00 mg/dL   Total CHOL/HDL Ratio 5     Comment:                Men          Women1/2 Average Risk     3.4          3.3Average Risk          5.0          4.42X Average Risk          9.6          7.13X Average Risk          15.0          11.0                      LDL cholesterol, direct     Status: None   Collection Time: 03/17/16 12:04 PM  Result Value Ref Range   Direct LDL 75.0 mg/dL    Comment: Optimal:  <100 mg/dLNear or Above Optimal:  100-129 mg/dLBorderline High:  130-159 mg/dLHigh:  160-189 mg/dLVery High:  >190 mg/dL   Assessment/Plan: 1. Essential hypertension, benign Medications refilled. BP stable.  - lisinopril (PRINIVIL,ZESTRIL) 5 MG tablet; Take 1 tablet (5 mg total) by mouth daily.  Dispense: 30 tablet; Refill:  6  2. Other fatigue After resolved pancreatitis. Patient notes history of b12 deficiency. Will check b12 and Vitamin D level.  - B12 - Vitamin D 1,25 dihydroxy  3. Type 2 diabetes mellitus without complication, with long-term current use of insulin (HCC) Will repeat A1C today. Will add on Invokana 100 mg daily giving elevated fasting sugars. Continue other medications as directed. Insulin titration guide given. FU 1 month. - Hemoglobin A1c

## 2016-03-24 NOTE — Telephone Encounter (Signed)
PA initiated, awaiting determination. JG//CMA  

## 2016-03-24 NOTE — Progress Notes (Signed)
Pre visit review using our clinic review tool, if applicable. No additional management support is needed unless otherwise documented below in the visit note/SLS  

## 2016-03-26 ENCOUNTER — Telehealth: Payer: Self-pay | Admitting: *Deleted

## 2016-03-26 NOTE — Telephone Encounter (Signed)
-----   Message from Brunetta Jeans, PA-C sent at 03/25/2016  9:10 AM EDT ----- A1C improving -- at 9.1. Take the invokana as directed. FU 1 month. B12 is low. Recommend we start him on a b12 injection every 2 weeks over the next 2 months, then once monthly. Ok to go ahead and schedule with Therapist, sports.

## 2016-03-26 NOTE — Telephone Encounter (Signed)
Called and spoke with the pt and informed him of recent lab results and note. Pt verbalized understanding and agreed.  Pt stated that when he went for his prescriptions they did not give him the [place].  Informed the pt that the prescription was called in, but I will check with the pharmacy to make sure they got it.  Pt agreed.  Called the pharmacy and was told they received the prescription but it was not filled because the cost for 30 days was $175.83.  They stated that they will fill it if the pt wants to pay for it.  Called the pt back and informed him of what the pharmacy informed me, and the pt stated that he was given a coupon by [place] so he will take the coupon to the pharmacy and see what the cost will be with the coupon.  Informed the pt if the cost is still to much please let us know.  He agreed.  Pt already has an 1 mos follow-up scheduled for (04/23/16).  Pt also agreed to start the B12 injections, so the pt was scheduled his first B12 injection on (Fri-04/11/16 @ 2:30pm).//AB/CMA

## 2016-03-28 LAB — VITAMIN D 1,25 DIHYDROXY
VITAMIN D 1, 25 (OH) TOTAL: 55 pg/mL (ref 18–72)
VITAMIN D3 1, 25 (OH): 55 pg/mL
Vitamin D2 1, 25 (OH)2: <8 pg/mL

## 2016-04-11 ENCOUNTER — Ambulatory Visit: Payer: BLUE CROSS/BLUE SHIELD

## 2016-04-16 ENCOUNTER — Encounter: Payer: BLUE CROSS/BLUE SHIELD | Admitting: Physician Assistant

## 2016-04-23 ENCOUNTER — Encounter: Payer: Self-pay | Admitting: Physician Assistant

## 2016-04-23 ENCOUNTER — Ambulatory Visit (INDEPENDENT_AMBULATORY_CARE_PROVIDER_SITE_OTHER): Payer: BLUE CROSS/BLUE SHIELD | Admitting: Physician Assistant

## 2016-04-23 VITALS — BP 126/76 | HR 81 | Temp 97.9°F | Resp 18 | Ht 76.0 in | Wt 271.4 lb

## 2016-04-23 DIAGNOSIS — E1140 Type 2 diabetes mellitus with diabetic neuropathy, unspecified: Secondary | ICD-10-CM

## 2016-04-23 DIAGNOSIS — E538 Deficiency of other specified B group vitamins: Secondary | ICD-10-CM

## 2016-04-23 LAB — POCT GLUCOSE (DEVICE FOR HOME USE): Glucose Fasting, POC: 274 mg/dL — AB (ref 70–99)

## 2016-04-23 MED ORDER — CYANOCOBALAMIN 1000 MCG/ML IJ SOLN
1000.0000 ug | Freq: Once | INTRAMUSCULAR | Status: AC
  Administered 2016-04-23: 1000 ug via INTRAMUSCULAR

## 2016-04-23 NOTE — Progress Notes (Signed)
Pre visit review using our clinic review tool, if applicable. No additional management support is needed unless otherwise documented below in the visit note/SLS  

## 2016-04-23 NOTE — Patient Instructions (Signed)
Please continue the long-acting insulin as directed. Pick up your Invokana today and start taking as directed. No Onglyza for now. Use the short-acting insulin as directed following your sliding scale.  I will be referring you to Nutrition. You will be contacted for appointment.  Check sugars and write them down. Follow-up in 3-4 weeks.

## 2016-04-24 NOTE — Assessment & Plan Note (Signed)
Discussed that he needs to get more serious about glycemic control. Referral to Nutrition placed. He will pick up Invokana today and start taking as directed. Continue all other meds as directed. Check fasting sugars as directed. FU scheduled.

## 2016-04-24 NOTE — Progress Notes (Signed)
Patient presents to clinic today for 1 month follow-up of Diabetes Mellitus II, uncontrolled, after the addition of Invokana 100 mg. Patient previously on Lantus, Humalog and Metformin. Last A1C at 9.1, when Invokana was started. Patient states that he has not started the Invokana as directed. Is taking other medications. Lantus currently at 40 units BID. Fasting sugars averaging 200-300. Endorses he is not following recommended diet or exercising. Has new insurance and is ready to really focus on his sugar levels.  Past Medical History  Diagnosis Date  . Diabetes mellitus without complication ([place])   . Hypertension   . Hyperlipidemia   . Complication of anesthesia     has night terrors after anesthesia but can be violent when waking up  . 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 Pt stated "I forget alot of things"  . Frequent urination at night     when blood sugar runs high  . Psoriasis of scalp     nose and face; occurs mainly in winter   . Seasonal allergies   . CAD (coronary artery disease)   . Sinus infection 12/16/13  . ADHD (attention deficit hyperactivity disorder)   . Allergy   . Depression   . Anxiety     Current Outpatient Prescriptions on File Prior to Visit  Medication Sig Dispense Refill  . fenofibrate (TRICOR) 145 MG tablet Take 1 tablet (145 mg total) by mouth daily. 30 tablet 1  . gabapentin (NEURONTIN) 300 MG capsule Take 600 mg each AM, 300 mg noon and 600 mg PM. 450 capsule 1  . insulin NPH Human (HUMULIN N,NOVOLIN N) 100 UNIT/ML injection 30 units in the morning and 30-40 units at bedtime. 10 mL 2  . lisinopril (PRINIVIL,ZESTRIL) 5 MG tablet Take 1 tablet (5 mg total) by mouth daily. 30 tablet 6  . metFORMIN (GLUCOPHAGE) 1000 MG tablet Take 1 tablet (1,000 mg total) by mouth 2 (two) times daily with a meal. 180 tablet 0  . Multiple Vitamins-Minerals (ZINC PO) Take 1 tablet by mouth daily.     Marland Kitchen oxyCODONE-acetaminophen (PERCOCET) 5-325 MG tablet  Take 1 tablet by mouth every 4 (four) hours as needed for moderate pain. 20 tablet 0  . atorvastatin (LIPITOR) 10 MG tablet Take 1 tablet (10 mg total) by mouth daily. (Patient not taking: Reported on 03/24/2016) 30 tablet 5  . canagliflozin (INVOKANA) 100 MG TABS tablet Take 1 tablet (100 mg total) by mouth daily before breakfast. (Patient not taking: Reported on 04/23/2016) 30 tablet 0  . insulin lispro (HUMALOG KWIKPEN) 100 UNIT/ML KiwkPen Inject 0-12 Units into the skin 3 (three) times daily with meals. sliding scale- check sugar and inject 3 times daily before meals as below: <150- Zero units 150-200 2 units 201-250 4 units 251-300 6 units 301-350 8 units 351-400 10 units >400 12 units and contact us. (Patient not taking: Reported on 04/23/2016) 15 mL 11  . traMADol (ULTRAM) 50 MG tablet Take 1 tablet (50 mg total) by mouth every 12 (twelve) hours as needed. (Patient not taking: Reported on 04/23/2016) 60 tablet 0   No current facility-administered medications on file prior to visit.    Allergies  Allergen Reactions  . Sulfa Antibiotics Itching and Other (See Comments)    Burning also    Family History  Problem Relation Age of Onset  . Diabetes Mother   . Cancer Mother     history breast cancer  . Hypertension Mother   .  Cancer Father     prostate  . Alzheimer's disease Father   . Diabetes Father   . Hypertension Father   . Cancer Maternal Uncle     colon  . Colon cancer Maternal Uncle 53  . Cancer Maternal Grandmother     breast  . Cancer Maternal Grandfather     prostate  . Cancer Maternal Uncle     prostate  . Colon cancer Maternal Uncle 6  . Cancer Maternal Uncle     prostate  . Cancer Cousin 47    metastatic colon cancer?  . Colon polyps Paternal Uncle   . Esophageal cancer Neg Hx   . Stomach cancer Neg Hx   . Rectal cancer Neg Hx     Social History   Social History  . Marital Status: Married    Spouse Name: N/A  . Number of Children: 6  . Years of  Education: N/A   Social History Main Topics  . Smoking status: Never Smoker   . Smokeless tobacco: Never Used  . Alcohol Use: 0.6 - 1.2 oz/week    1-2 Cans of beer per week     Comment: rare beer  . Drug Use: No  . Sexual Activity:    Partners: Female   Other Topics Concern  . None   Social History Narrative   Regular exercise: very little   Caffeine use: sweet tea daily   6 children   Works in Architect, owns concession.   Married   Enjoys televition.      Review of Systems - See HPI.  All other ROS are negative.  BP 126/76 mmHg  Pulse 81  Temp(Src) 97.9 F (36.6 C) (Oral)  Resp 18  Ht '6\' 4"'$  (1.93 m)  Wt 271 lb 6 oz (123.095 kg)  BMI 33.05 kg/m2  SpO2 98%  Physical Exam  Constitutional: He is oriented to person, place, and time and well-developed, well-nourished, and in no distress.  HENT:  Head: Normocephalic and atraumatic.  Eyes: Conjunctivae are normal.  Cardiovascular: Normal rate, regular rhythm, normal heart sounds and intact distal pulses.   Pulmonary/Chest: Effort normal and breath sounds normal. No respiratory distress. He has no wheezes. He has no rales. He exhibits no tenderness.  Neurological: He is alert and oriented to person, place, and time.  Skin: Skin is warm and dry. No rash noted.  Vitals reviewed.   Recent Results (from the past 2160 hour(s))  Urinalysis, Routine w reflex microscopic (not at [facility])     Status: None   Collection Time: 03/12/16 10:31 PM  Result Value Ref Range   Color, Urine YELLOW YELLOW   APPearance CLEAR CLEAR   Specific Gravity, Urine 1.014 1.005 - 1.030   pH 7.0 5.0 - 8.0   Glucose, UA NEGATIVE NEGATIVE mg/dL   Hgb urine dipstick NEGATIVE NEGATIVE   Bilirubin Urine NEGATIVE NEGATIVE   Ketones, ur NEGATIVE NEGATIVE mg/dL   Protein, ur NEGATIVE NEGATIVE mg/dL   Nitrite NEGATIVE NEGATIVE   Leukocytes, UA NEGATIVE NEGATIVE    Comment: MICROSCOPIC NOT DONE ON URINES WITH NEGATIVE PROTEIN, BLOOD, LEUKOCYTES,  NITRITE, OR GLUCOSE <1000 mg/dL.  CBC with Differential     Status: Abnormal   Collection Time: 03/12/16 10:39 PM  Result Value Ref Range   WBC 7.2 4.0 - 10.5 K/uL   RBC 5.03 4.22 - 5.81 MIL/uL   Hemoglobin 12.1 (L) 13.0 - 17.0 g/dL   HCT 38.8 (L) 39.0 - 52.0 %   MCV 77.1 (L) 78.0 -  100.0 fL   MCH 24.1 (L) 26.0 - 34.0 pg   MCHC 31.2 30.0 - 36.0 g/dL   RDW 14.9 11.5 - 15.5 %   Platelets 347 150 - 400 K/uL   Neutrophils Relative % 60 %   Neutro Abs 4.3 1.7 - 7.7 K/uL   Lymphocytes Relative 32 %   Lymphs Abs 2.3 0.7 - 4.0 K/uL   Monocytes Relative 6 %   Monocytes Absolute 0.5 0.1 - 1.0 K/uL   Eosinophils Relative 2 %   Eosinophils Absolute 0.1 0.0 - 0.7 K/uL   Basophils Relative 0 %   Basophils Absolute 0.0 0.0 - 0.1 K/uL  Comprehensive metabolic panel     Status: Abnormal   Collection Time: 03/12/16 10:39 PM  Result Value Ref Range   Sodium 135 135 - 145 mmol/L   Potassium 3.9 3.5 - 5.1 mmol/L   Chloride 102 101 - 111 mmol/L   CO2 24 22 - 32 mmol/L   Glucose, Bld 190 (H) 65 - 99 mg/dL   BUN 8 6 - 20 mg/dL   Creatinine, Ser 0.80 0.61 - 1.24 mg/dL   Calcium 9.0 8.9 - 10.3 mg/dL   Total Protein 6.9 6.5 - 8.1 g/dL   Albumin 3.7 3.5 - 5.0 g/dL   AST 26 15 - 41 U/L   ALT 30 17 - 63 U/L   Alkaline Phosphatase 66 38 - 126 U/L   Total Bilirubin 0.3 0.3 - 1.2 mg/dL   GFR calc non Af Amer >60 >60 mL/min   GFR calc Af Amer >60 >60 mL/min    Comment: (NOTE) The eGFR has been calculated using the CKD EPI equation. This calculation has not been validated in all clinical situations. eGFR's persistently <60 mL/min signify possible Chronic Kidney Disease.    Anion gap 9 5 - 15  Lipase, blood     Status: Abnormal   Collection Time: 03/12/16 10:39 PM  Result Value Ref Range   Lipase 112 (H) 11 - 51 U/L  Comp Met (CMET)     Status: Abnormal   Collection Time: 03/17/16 12:04 PM  Result Value Ref Range   Sodium 136 135 - 145 mEq/L   Potassium 4.0 3.5 - 5.1 mEq/L   Chloride 101 96 -  112 mEq/L   CO2 26 19 - 32 mEq/L   Glucose, Bld 116 (H) 70 - 99 mg/dL   BUN 10 6 - 23 mg/dL   Creatinine, Ser 0.74 0.40 - 1.50 mg/dL   Total Bilirubin 0.4 0.2 - 1.2 mg/dL   Alkaline Phosphatase 65 39 - 117 U/L   AST 29 0 - 37 U/L   ALT 36 0 - 53 U/L   Total Protein 7.1 6.0 - 8.3 g/dL   Albumin 4.2 3.5 - 5.2 g/dL   Calcium 9.4 8.4 - 10.5 mg/dL   GFR 117.62 >60.00 mL/min  Lipase     Status: None   Collection Time: 03/17/16 12:04 PM  Result Value Ref Range   Lipase 25.0 11.0 - 59.0 U/L  Lipid panel     Status: Abnormal   Collection Time: 03/17/16 12:04 PM  Result Value Ref Range   Cholesterol 183 0 - 200 mg/dL    Comment: ATP III Classification       Desirable:  < 200 mg/dL               Borderline High:  200 - 239 mg/dL          High:  > = 240  mg/dL   Triglycerides (H) 0.0 - 149.0 mg/dL    442.0 Triglyceride is over 400; calculations on Lipids are invalid.    Comment: Normal:  <150 mg/dLBorderline High:  150 - 199 mg/dL   HDL 37.40 (L) >39.00 mg/dL   Total CHOL/HDL Ratio 5     Comment:                Men          Women1/2 Average Risk     3.4          3.3Average Risk          5.0          4.42X Average Risk          9.6          7.13X Average Risk          15.0          11.0                      LDL cholesterol, direct     Status: None   Collection Time: 03/17/16 12:04 PM  Result Value Ref Range   Direct LDL 75.0 mg/dL    Comment: Optimal:  <100 mg/dLNear or Above Optimal:  100-129 mg/dLBorderline High:  130-159 mg/dLHigh:  160-189 mg/dLVery High:  >190 mg/dL  Hemoglobin A1c     Status: Abnormal   Collection Time: 03/24/16 11:12 AM  Result Value Ref Range   Hgb A1c MFr Bld 9.1 (H) 4.6 - 6.5 %    Comment: Glycemic Control Guidelines for People with Diabetes:Non Diabetic:  <6%Goal of Therapy: <7%Additional Action Suggested:  >8%   B12     Status: Abnormal   Collection Time: 03/24/16 11:12 AM  Result Value Ref Range   Vitamin B-12 176 (L) 211 - 911 pg/mL  Vitamin D 1,25 dihydroxy      Status: None   Collection Time: 03/24/16 11:12 AM  Result Value Ref Range   Vitamin D 1, 25 (OH)2 Total 55 18 - 72 pg/mL   Vitamin D3 1, 25 (OH)2 55 pg/mL   Vitamin D2 1, 25 (OH)2 <8 pg/mL    Comment: Vitamin D3, 1,25(OH)2 indicates both endogenous production and supplementation.  Vitamin D2, 1,25(OH)2 is an indicator of exogeous sources, such as diet or supplementation.  Interpretation and therapy are based on measurement of Vitamin D,1,25(OH)2, Total. This test was developed and its analytical performance characteristics have been determined by [facility], [place], [place]. It has not been cleared or approved by the FDA. This assay has been validated pursuant to the CLIA regulations and is used for clinical purposes.   POCT Glucose (Device for Home Use)     Status: Abnormal   Collection Time: 04/23/16 12:27 PM  Result Value Ref Range   Glucose Fasting, POC 274 (A) 70 - 99 mg/dL   POC Glucose  70 - 99 mg/dl    Assessment/Plan: DM neuropathy, type II diabetes mellitus Discussed that he needs to get more serious about glycemic control. Referral to Nutrition placed. He will pick up Invokana today and start taking as directed. Continue all other meds as directed. Check fasting sugars as directed. FU scheduled.      Leeanne Rio, PA-C

## 2016-04-29 LAB — HM DIABETES EYE EXAM

## 2016-05-04 ENCOUNTER — Ambulatory Visit (HOSPITAL_BASED_OUTPATIENT_CLINIC_OR_DEPARTMENT_OTHER): Payer: BLUE CROSS/BLUE SHIELD | Attending: Otolaryngology | Admitting: Internal Medicine

## 2016-05-04 VITALS — Ht 76.0 in | Wt 270.0 lb

## 2016-05-04 DIAGNOSIS — R0683 Snoring: Secondary | ICD-10-CM | POA: Diagnosis present

## 2016-05-04 DIAGNOSIS — G4733 Obstructive sleep apnea (adult) (pediatric): Secondary | ICD-10-CM | POA: Diagnosis not present

## 2016-05-04 NOTE — Progress Notes (Signed)
This encounter was created in error - please disregard.

## 2016-05-17 DIAGNOSIS — R0683 Snoring: Secondary | ICD-10-CM | POA: Diagnosis not present

## 2016-05-17 NOTE — Procedures (Signed)
Patient Name: Steve [name], Steve [name] Date: 05/04/2016 Gender: Male D.O.B: March 26, 1961 Age (years): 55 Referring Provider: Jodi Marble Height (inches): 76 Interpreting Physician: Baird Lyons MD, ABSM Weight (lbs): 270 RPSGT: Madelon Lips BMI: 33 MRN: [id number] Neck Size: 18.50 CLINICAL INFORMATION Sleep Study Type: Split Night CPAP Indication for sleep study: Snoring Epworth Sleepiness Score: 12  SLEEP STUDY TECHNIQUE As per the AASM Manual for the Scoring of Sleep and Associated Events v2.3 (April 2016) with a hypopnea requiring 4% desaturations. The channels recorded and monitored were frontal, central and occipital EEG, electrooculogram (EOG), submentalis EMG (chin), nasal and oral airflow, thoracic and abdominal wall motion, anterior tibialis EMG, snore microphone, electrocardiogram, and pulse oximetry. Continuous positive airway pressure (CPAP) was initiated when the patient met split night criteria and was titrated according to treat sleep-disordered breathing.  MEDICATIONS Medications taken by the patient : charted for review Medications administered by patient during sleep study : No sleep medicine administered.  RESPIRATORY PARAMETERS Diagnostic Total AHI (/hr): 10.1 RDI (/hr): 19.2 OA Index (/hr): - CA Index (/hr): 0.0 REM AHI (/hr): 40.0 NREM AHI (/hr): 5.5 Supine AHI (/hr): 0.0 Non-supine AHI (/hr): 10.18 Min O2 Sat (%): 86.00 Mean O2 (%): 92.73 Time below 88% (min): 0.3   Titration Optimal Pressure (cm): 12 AHI at Optimal Pressure (/hr): 4.5 Min O2 at Optimal Pressure (%): 90.0 Supine % at Optimal (%): 26 Sleep % at Optimal (%): 99    SLEEP ARCHITECTURE The recording time for the entire night was 399.9 minutes. During a baseline period of 186.2 minutes, the patient slept for 125.3 minutes in REM and nonREM, yielding a sleep efficiency of 67.3%. Sleep onset after lights out was 9.4 minutes with a REM latency of 104.5 minutes. The patient spent 11.97% of the  night in stage N1 sleep, 63.29% in stage N2 sleep, 11.57% in stage N3 and 13.17% in REM. During the titration period of 207.6 minutes, the patient slept for 204.9 minutes in REM and nonREM, yielding a sleep efficiency of 98.7%. Sleep onset after CPAP initiation was 1.2 minutes with a REM latency of 39.5 minutes. The patient spent 4.88% of the night in stage N1 sleep, 52.95% in stage N2 sleep, 7.81% in stage N3 and 34.37% in REM.  CARDIAC DATA The 2 lead EKG demonstrated sinus rhythm. The mean heart rate was 66.11 beats per minute. Other EKG findings include: None.  LEG MOVEMENT DATA The total Periodic Limb Movements of Sleep (PLMS) were 256. The PLMS index was 46.07 .  IMPRESSIONS - Mild obstructive sleep apnea occurred during the diagnostic portion of the study (AHI = 10.1 /hour). An optimal PAP pressure was selected for this patient ( 12 cm of water) - No significant central sleep apnea occurred during the diagnostic portion of the study (CAI = 0.0/hour). - Moderate oxygen desaturation was noted during the diagnostic portion of the study (Min O2 =86.00%). - The patient snored with Loud snoring volume during the diagnostic portion of the study. - No cardiac abnormalities were noted during this study. - Moderate periodic limb movements of sleep occurred during the study.  DIAGNOSIS - Obstructive Sleep Apnea (327.23 [G47.33 ICD-10])  RECOMMENDATIONS - Trial of CPAP therapy on 12 cm H2O with a Medium size Resmed Full Face Mask AirFit F20 mask and heated humidification. - Avoid alcohol, sedatives and other CNS depressants that may worsen sleep apnea and disrupt normal sleep architecture. - Sleep hygiene should be reviewed to assess factors that may improve sleep quality. - Weight management and regular  exercise should be initiated or continued.  [Electronically signed] 05/17/2016 01:22 PM  Baird Lyons MD, [place], American Board of Sleep Medicine   NPI: [id number] [place], American Board of Sleep Medicine  ELECTRONICALLY SIGNED ON:  05/17/2016, 1:20 PM [place] PH: [phone]   FX: [phone] [place]

## 2016-05-19 ENCOUNTER — Encounter: Payer: Self-pay | Admitting: Physician Assistant

## 2016-05-19 ENCOUNTER — Ambulatory Visit (INDEPENDENT_AMBULATORY_CARE_PROVIDER_SITE_OTHER): Payer: BLUE CROSS/BLUE SHIELD | Admitting: Physician Assistant

## 2016-05-19 DIAGNOSIS — E1140 Type 2 diabetes mellitus with diabetic neuropathy, unspecified: Secondary | ICD-10-CM

## 2016-05-19 DIAGNOSIS — Z794 Long term (current) use of insulin: Secondary | ICD-10-CM | POA: Diagnosis not present

## 2016-05-19 MED ORDER — DAPAGLIFLOZIN PROPANEDIOL 5 MG PO TABS: 5.0000 mg | ORAL_TABLET | Freq: Every day | ORAL | 5 refills | Status: DC

## 2016-05-19 MED ORDER — TRAMADOL HCL 50 MG PO TABS: 50.0000 mg | ORAL_TABLET | Freq: Two times a day (BID) | ORAL | 0 refills | Status: DC | PRN

## 2016-05-19 MED ORDER — ATORVASTATIN CALCIUM 10 MG PO TABS: 10.0000 mg | ORAL_TABLET | Freq: Every day | ORAL | 5 refills | Status: AC

## 2016-05-19 MED ORDER — OXYCODONE-ACETAMINOPHEN 5-325 MG PO TABS: 1.0000 | ORAL_TABLET | ORAL | 0 refills | Status: DC | PRN

## 2016-05-19 NOTE — Assessment & Plan Note (Signed)
CANVAS trial discussed with patient. He and I both agree that we should switch to [place] for now. Voucher given. Will stop Invokana and begin Farxiga 5 mg daily. Continue insulin as directed. Fasting sugars are improving. He has appointment with Nutritionist in a few weeks. I feel this will help him tremendously as inconsistent diet is a major problem for him. FU scheduled.

## 2016-05-19 NOTE — Patient Instructions (Addendum)
Please stop the Invokana. Start the [place] taking daily as directed. (Use the voucher I have given you to make medication free - let me know if there are any issues).  Continue all other medications as directed. Make sure to see the nutritionist as scheduled. This is very important and I expect meal planning will help sugar levels quite a bit.  Stay active to promote weight loss.  Return to clinic after 06/24/16 for labs.  I will call you with your results.  FU with me in 3 months.

## 2016-05-19 NOTE — Progress Notes (Signed)
Pre visit review using our clinic review tool, if applicable. No additional management support is needed unless otherwise documented below in the visit note/SLS  

## 2016-05-19 NOTE — Progress Notes (Signed)
History of Present Illness: Patient is a 55 y.o. male who presents to clinic today for follow-up of Diabetes Mellitus II, uncontrolled.  At last visit, Invokana was added to his insulin regimen. Endorses taking as directed without side effects. Endorses fasting CBGs averaging 100-150 presently, depending on diet.  Patient notes reading about potential for limb amputations with Invokana and would like to discuss further.   Latest Maintenance: A1C --  Lab Results  Component Value Date   HGBA1C 9.1 (H) 03/24/2016   Diabetic Eye Exam -- up-to-date. No retinopathy. Urine Microalbumin -- Currently on ACEI. Foot Exam -- up-to-date  Past Medical History:  Diagnosis Date  . ADHD (attention deficit hyperactivity disorder)   . Allergy   . Anxiety   . CAD (coronary artery disease)   . Complication of anesthesia    has night terrors after anesthesia but can be violent when waking up  . Depression   . Diabetes mellitus without complication ([place])   . Frequent urination at night    when blood sugar runs high  . Hyperlipidemia   . Hypertension   . 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 Pt stated "I forget alot of things"  . Psoriasis of scalp    nose and face; occurs mainly in winter   . Seasonal allergies   . Sinus infection 12/16/13    Current Outpatient Prescriptions on File Prior to Visit  Medication Sig Dispense Refill  . fenofibrate (TRICOR) 145 MG tablet Take 1 tablet (145 mg total) by mouth daily. 30 tablet 1  . gabapentin (NEURONTIN) 300 MG capsule Take 600 mg each AM, 300 mg noon and 600 mg PM. 450 capsule 1  . insulin lispro (HUMALOG KWIKPEN) 100 UNIT/ML KiwkPen Inject 0-12 Units into the skin 3 (three) times daily with meals. sliding scale- check sugar and inject 3 times daily before meals as below: <150- Zero units 150-200 2 units 201-250 4 units 251-300 6 units 301-350 8 units 351-400 10 units >400 12 units and contact us. 15 mL 11  . insulin NPH Human (HUMULIN  N,NOVOLIN N) 100 UNIT/ML injection 30 units in the morning and 30-40 units at bedtime. 10 mL 2  . lisinopril (PRINIVIL,ZESTRIL) 5 MG tablet Take 1 tablet (5 mg total) by mouth daily. 30 tablet 6  . metFORMIN (GLUCOPHAGE) 1000 MG tablet Take 1 tablet (1,000 mg total) by mouth 2 (two) times daily with a meal. 180 tablet 0  . Multiple Vitamins-Minerals (ZINC PO) Take 1 tablet by mouth daily.      No current facility-administered medications on file prior to visit.     Allergies  Allergen Reactions  . Sulfa Antibiotics Itching and Other (See Comments)    Burning also    Family History  Problem Relation Age of Onset  . Diabetes Mother   . Cancer Mother     history breast cancer  . Hypertension Mother   . Cancer Father     prostate  . Alzheimer's disease Father   . Diabetes Father   . Hypertension Father   . Cancer Maternal Uncle     colon  . Colon cancer Maternal Uncle 64  . Cancer Maternal Grandmother     breast  . Cancer Maternal Grandfather     prostate  . Cancer Maternal Uncle     prostate  . Colon cancer Maternal Uncle 37  . Cancer Maternal Uncle     prostate  . Cancer Cousin 26  metastatic colon cancer?  . Colon polyps Paternal Uncle   . Esophageal cancer Neg Hx   . Stomach cancer Neg Hx   . Rectal cancer Neg Hx     Social History   Social History  . Marital status: Married    Spouse name: N/A  . Number of children: 6  . Years of education: N/A   Social History Main Topics  . Smoking status: Never Smoker  . Smokeless tobacco: Never Used  . Alcohol use 0.6 - 1.2 oz/week    1 - 2 Cans of beer per week     Comment: rare beer  . Drug use: No  . Sexual activity: Yes    Partners: Female   Other Topics Concern  . None   Social History Narrative   Regular exercise: very little   Caffeine use: sweet tea daily   6 children   Works in Architect, owns concession.   Married   Enjoys televition.     Review of Systems: Pertinent ROS are listed in  HPI  Physical Examination: BP 130/72 (BP Location: Right Arm, Patient Position: Sitting, Cuff Size: Large)   Pulse 86   Temp 97.8 F (36.6 C) (Oral)   Resp 16   Ht 6\' 4"  (1.93 m)   Wt 278 lb (126.1 kg)   SpO2 96%   BMI 33.84 kg/m  General appearance: alert, cooperative and appears stated age Lungs: clear to auscultation bilaterally Heart: regular rate and rhythm, S1, S2 normal, no murmur, click, rub or gallop Pulses: 2+ and symmetric  Assessment/Plan: DM neuropathy, type II diabetes mellitus CANVAS trial discussed with patient. He and I both agree that we should switch to [place] for now. Voucher given. Will stop Invokana and begin Farxiga 5 mg daily. Continue insulin as directed. Fasting sugars are improving. He has appointment with Nutritionist in a few weeks. I feel this will help him tremendously as inconsistent diet is a major problem for him. FU scheduled.

## 2016-05-22 ENCOUNTER — Encounter: Payer: Self-pay | Admitting: *Deleted

## 2016-05-22 ENCOUNTER — Encounter: Payer: BLUE CROSS/BLUE SHIELD | Attending: Physician Assistant | Admitting: *Deleted

## 2016-05-22 DIAGNOSIS — Z713 Dietary counseling and surveillance: Secondary | ICD-10-CM | POA: Diagnosis not present

## 2016-05-22 DIAGNOSIS — E1140 Type 2 diabetes mellitus with diabetic neuropathy, unspecified: Secondary | ICD-10-CM | POA: Insufficient documentation

## 2016-05-22 NOTE — Patient Instructions (Signed)
Plan:  Aim for 4 Carb Choices per meal (60 grams) +/- 1 either way  Aim for 0-2 Carbs per snack if hungry  Include protein in moderation with your meals and snacks Consider reading food labels for Total Carbohydrate of foods Continue with your activity level daily as tolerated Consider checking BG before breakfast and before supper so you can see if you need any Humalog insulin or not. Bring Humalog Pen with you to work, put in a cooler if not working in [employer], occupational, It is OK at 85 degrees or less(room toemperature) for up to 28 days Check with your Pharmacist to see which brand of insulin your insurance covers the best [employer] [place] or [place])?

## 2016-06-04 NOTE — Progress Notes (Signed)
Diabetes Self-Management Education  Visit Type: First/Initial  Appt. Start Time: 1000 Appt. End Time: 1130  06/04/2016  Steve [name], identified by name and date of birth, is a 55 y.o. male with a diagnosis of Diabetes: Type 2. Patient states he cannot afford his insulin and some other medications.  ASSESSMENT  Height 6\' 4"  (1.93 m), weight 273 lb 12.8 oz (124.2 kg). Body mass index is 33.33 kg/m.      Diabetes Self-Management Education - 06/04/16 1505      Visit Information   Visit Type First/Initial     Initial Visit   Diabetes Type Type 2   Are you currently following a meal plan? No   Are you taking your medications as prescribed? Yes   Date Diagnosed [place]   How would you rate your overall health? Fair     Psychosocial Assessment   Patient Belief/Attitude about Diabetes Motivated to manage diabetes   Other persons present Patient   Patient Concerns Nutrition/Meal planning;Glycemic Control;Weight Control   Special Needs None   Preferred Learning Style Auditory;Hands on   Learning Readiness Ready     Pre-Education Assessment   Patient understands the diabetes disease and treatment process. Needs Instruction   Patient understands incorporating nutritional management into lifestyle. Needs Instruction   Patient undertands incorporating physical activity into lifestyle. Needs Instruction   Patient understands using medications safely. Needs Instruction   Patient understands monitoring blood glucose, interpreting and using results Needs Instruction   Patient understands prevention, detection, and treatment of acute complications. Needs Instruction   Patient understands prevention, detection, and treatment of chronic complications. Needs Instruction   Patient understands how to develop strategies to address psychosocial issues. Needs Instruction   Patient understands how to develop strategies to promote health/change behavior. Needs Instruction     Complications   Last HgB A1C per patient/outside source 9.1 %   How often do you check your blood sugar? 1-2 times/day   Fasting Blood glucose range (mg/dL) 70-129;130-179   Postprandial Blood glucose range (mg/dL) >200   Number of hypoglycemic episodes per month 0   Have you had a dilated eye exam in the past 12 months? Yes   Have you had a dental exam in the past 12 months? No   Are you checking your feet? Yes   How many days per week are you checking your feet? 7     Exercise   Exercise Type Light (walking / raking leaves)  works as Photographer businesses   How many days per week to you exercise? 0   How many minutes per day do you exercise? 0   Total minutes per week of exercise 0     Patient Education   Previous Diabetes Education Yes (please comment)   Disease state  Definition of diabetes, type 1 and 2, and the diagnosis of diabetes   Nutrition management  Role of diet in the treatment of diabetes and the relationship between the three main macronutrients and blood glucose level;Food label reading, portion sizes and measuring food.   Physical activity and exercise  Role of exercise on diabetes management, blood pressure control and cardiac health.   Medications Reviewed patients medication for diabetes, action, purpose, timing of dose and side effects.   Monitoring Purpose and frequency of SMBG.;Identified appropriate SMBG and/or A1C goals.   Acute complications Taught treatment of hypoglycemia - the 15 rule.   Chronic complications Relationship between chronic complications and blood glucose  control   Psychosocial adjustment Role of stress on diabetes     Individualized Goals (developed by patient)   Nutrition Follow meal plan discussed   Physical Activity Exercise 3-5 times per week   Medications take my medication as prescribed   Monitoring  test blood glucose pre and post meals as discussed     Post-Education Assessment   Patient understands the  diabetes disease and treatment process. Demonstrates understanding / competency   Patient understands incorporating nutritional management into lifestyle. Demonstrates understanding / competency   Patient undertands incorporating physical activity into lifestyle. Demonstrates understanding / competency   Patient understands using medications safely. Demonstrates understanding / competency   Patient understands monitoring blood glucose, interpreting and using results Demonstrates understanding / competency   Patient understands prevention, detection, and treatment of acute complications. Demonstrates understanding / competency   Patient understands prevention, detection, and treatment of chronic complications. Demonstrates understanding / competency   Patient understands how to develop strategies to address psychosocial issues. Demonstrates understanding / competency   Patient understands how to develop strategies to promote health/change behavior. Demonstrates understanding / competency     Outcomes   Expected Outcomes Demonstrated interest in learning. Expect positive outcomes   Future DMSE PRN   Program Status Completed      Individualized Plan for Diabetes Self-Management Training:   Learning Objective:  Patient will have a greater understanding of diabetes self-management. Patient education plan is to attend individual and/or group sessions per assessed needs and concerns.   Plan:   Patient Instructions  Plan:  Aim for 4 Carb Choices per meal (60 grams) +/- 1 either way  Aim for 0-2 Carbs per snack if hungry  Include protein in moderation with your meals and snacks Consider reading food labels for Total Carbohydrate of foods Continue with your activity level daily as tolerated Consider checking BG before breakfast and before supper so you can see if you need any Humalog insulin or not. 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       Expected Outcomes:  Demonstrated interest in learning. Expect positive outcomes  Education material provided: Living Well with Diabetes, Meal plan card and Carbohydrate counting sheet, Insulin Action Sheet  If problems or questions, patient to contact team via:  Phone and Email  Future DSME appointment: PRN

## 2016-06-19 ENCOUNTER — Ambulatory Visit (INDEPENDENT_AMBULATORY_CARE_PROVIDER_SITE_OTHER): Payer: BLUE CROSS/BLUE SHIELD | Admitting: Family Medicine

## 2016-06-19 ENCOUNTER — Encounter: Payer: Self-pay | Admitting: Family Medicine

## 2016-06-19 VITALS — BP 124/68 | HR 114 | Temp 98.1°F | Ht 76.0 in | Wt 270.0 lb

## 2016-06-19 DIAGNOSIS — Z794 Long term (current) use of insulin: Secondary | ICD-10-CM

## 2016-06-19 DIAGNOSIS — E1140 Type 2 diabetes mellitus with diabetic neuropathy, unspecified: Secondary | ICD-10-CM | POA: Diagnosis not present

## 2016-06-19 DIAGNOSIS — R1030 Lower abdominal pain, unspecified: Secondary | ICD-10-CM | POA: Diagnosis not present

## 2016-06-19 MED ORDER — METFORMIN HCL 1000 MG PO TABS: 1000.0000 mg | ORAL_TABLET | Freq: Two times a day (BID) | ORAL | 3 refills | Status: DC

## 2016-06-19 MED ORDER — DICYCLOMINE HCL 10 MG PO CAPS: 10.0000 mg | ORAL_CAPSULE | Freq: Three times a day (TID) | ORAL | 0 refills | Status: AC

## 2016-06-19 NOTE — Progress Notes (Signed)
Chief Complaint  Patient presents with  . Abdominal Pain    lower-x 1 week    Steve [name] is here for lower abdominal pain.  Duration: 1 weeks; started in upper abdomen and migrated lower b/l Nighttime awakenings? No; difficult to even fall asleep Bleeding? No Weight loss? Yes- 3 lbs Palliation: Nothing Provocation: Moving, touching it Describes pain as sharp and crampy. Associated symptoms: none Denies: fever, nausea, vomiting and diarrhea Treatment to date: none  Past Medical History:  Diagnosis Date  . ADHD (attention deficit hyperactivity disorder)   . Allergy   . Anxiety   . CAD (coronary artery disease)   . Complication of anesthesia    has night terrors after anesthesia but can be violent when waking up  . Depression   . Diabetes mellitus without complication ([place])   . Frequent urination at night    when blood sugar runs high  . Hyperlipidemia   . Hypertension   . 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 Pt stated "I forget alot of things"  . Psoriasis of scalp    nose and face; occurs mainly in winter   . Seasonal allergies   . Sinus infection 12/16/13   Family History  Problem Relation Age of Onset  . Diabetes Mother   . Cancer Mother     history breast cancer  . Hypertension Mother   . Cancer Father     prostate  . Alzheimer's disease Father   . Diabetes Father   . Hypertension Father   . Cancer Maternal Uncle     colon  . Colon cancer Maternal Uncle 78  . Cancer Maternal Uncle     prostate  . Colon cancer Maternal Uncle 28  . Colon polyps Paternal Uncle   . Cancer Maternal Grandmother     breast  . Cancer Maternal Grandfather     prostate  . Cancer Maternal Uncle     prostate  . Cancer Cousin 47    metastatic colon cancer?  . Esophageal cancer Neg Hx   . Stomach cancer Neg Hx   . Rectal cancer Neg Hx    Past Surgical History:  Procedure Laterality Date  . CARDIAC CATHETERIZATION     no PCI approx 10 years ago - [facility]     right ring finger  . HERNIA REPAIR  2010   abdominal hernia  . KNEE ARTHROSCOPY  2012   left knee  . KNEE SURGERY    . NASAL SEPTOPLASTY W/ TURBINOPLASTY  09/19/2015   Procedure: NASAL SEPTOPLASTY WITH TURBINATE REDUCTION;  Surgeon: Jodi Marble, MD;  Location: [facility];  Service: ENT;;  . TOTAL KNEE ARTHROPLASTY Left 12/28/2013   Procedure: TOTAL KNEE ARTHROPLASTY;  Surgeon: Ninetta Lights, MD;  Location: [facility];  Service: Orthopedics;  Laterality: Left;   BP 124/68 (BP Location: Left Arm, Patient Position: Sitting, Cuff Size: Large)   Pulse (!) 114   Temp 98.1 F (36.7 C) (Oral)   Ht 6\' 4"  (1.93 m)   Wt 270 lb (122.5 kg)   SpO2 94%   BMI 32.87 kg/m  Gen.: Awake, alert, appears stated age 55: His membranes moist without mucosal lesions Heart: Regular rate and rhythm without murmurs Lungs: Clear auscultation bilaterally, no rales or wheezing, normal effort without accessory muscle use. Abdomen: Bowel sounds are present. He is diffusely TTP, particularly over McBurney's Point. Abdomen is soft, nondistended, no masses or organomegaly. Negative Murphy's, Rovsing's,  and Carnett's sign. Psych: Age appropriate judgment and insight. Normal mood and affect.  Lower abdominal pain - Plan: CBC w/Diff, Comprehensive metabolic panel, dicyclomine (BENTYL) 10 MG capsule  Type 2 diabetes mellitus with diabetic neuropathy, with long-term current use of insulin ([place]) - Plan: metFORMIN (GLUCOPHAGE) 1000 MG tablet  Orders as above. His history does not support appendicitis, but his exam does concern me for it. He does not have an acute abdomen, but he was most tender over McBurney's point. If he comes back with a white count, I would recommend he seek care at the emergency department. If he develops a fever or nausea/vomitting, would also recommend ER care. He selected a [place] stool 3 or 4 on the chart. His stools have been unchanged. If he continues to have weight  loss, I would refer him to GI for a sooner scope than otherwise intended. F/u in 4 weeks if symptoms fail to improve. Pt voiced understanding and agreement to the plan.  [place], DO 06/19/16 4:22 PM

## 2016-06-19 NOTE — Progress Notes (Signed)
Pre visit review using our clinic review tool, if applicable. No additional management support is needed unless otherwise documented below in the visit note. 

## 2016-06-19 NOTE — Patient Instructions (Signed)
If you start developing a fever or nausea/vomiting, go to the ER.

## 2016-06-20 ENCOUNTER — Emergency Department (HOSPITAL_COMMUNITY): Payer: BLUE CROSS/BLUE SHIELD | Admitting: Anesthesiology

## 2016-06-20 ENCOUNTER — Emergency Department (HOSPITAL_COMMUNITY): Payer: BLUE CROSS/BLUE SHIELD

## 2016-06-20 ENCOUNTER — Telehealth: Payer: Self-pay | Admitting: Family Medicine

## 2016-06-20 ENCOUNTER — Encounter (HOSPITAL_COMMUNITY): Payer: Self-pay | Admitting: Emergency Medicine

## 2016-06-20 ENCOUNTER — Encounter (HOSPITAL_COMMUNITY): Admission: EM | Disposition: A | Discharge status: Home Health | Payer: Self-pay | Source: Home / Self Care

## 2016-06-20 ENCOUNTER — Inpatient Hospital Stay (HOSPITAL_COMMUNITY)
Admission: EM | Admit: 2016-06-20 | Discharge: 2016-06-26 | DRG: 339 | Disposition: A | Discharge status: Home Health | Payer: BLUE CROSS/BLUE SHIELD | Attending: General Surgery | Admitting: General Surgery

## 2016-06-20 DIAGNOSIS — Z5331 Laparoscopic surgical procedure converted to open procedure: Secondary | ICD-10-CM

## 2016-06-20 DIAGNOSIS — F419 Anxiety disorder, unspecified: Secondary | ICD-10-CM | POA: Diagnosis present

## 2016-06-20 DIAGNOSIS — Z96652 Presence of left artificial knee joint: Secondary | ICD-10-CM | POA: Diagnosis present

## 2016-06-20 DIAGNOSIS — G4733 Obstructive sleep apnea (adult) (pediatric): Secondary | ICD-10-CM | POA: Diagnosis present

## 2016-06-20 DIAGNOSIS — F909 Attention-deficit hyperactivity disorder, unspecified type: Secondary | ICD-10-CM | POA: Diagnosis present

## 2016-06-20 DIAGNOSIS — E785 Hyperlipidemia, unspecified: Secondary | ICD-10-CM | POA: Diagnosis present

## 2016-06-20 DIAGNOSIS — K353 Acute appendicitis with localized peritonitis: Secondary | ICD-10-CM | POA: Diagnosis not present

## 2016-06-20 DIAGNOSIS — K37 Unspecified appendicitis: Secondary | ICD-10-CM

## 2016-06-20 DIAGNOSIS — Q438 Other specified congenital malformations of intestine: Secondary | ICD-10-CM

## 2016-06-20 DIAGNOSIS — Z6833 Body mass index (BMI) 33.0-33.9, adult: Secondary | ICD-10-CM

## 2016-06-20 DIAGNOSIS — R1033 Periumbilical pain: Secondary | ICD-10-CM | POA: Diagnosis not present

## 2016-06-20 DIAGNOSIS — F329 Major depressive disorder, single episode, unspecified: Secondary | ICD-10-CM | POA: Diagnosis present

## 2016-06-20 DIAGNOSIS — E1140 Type 2 diabetes mellitus with diabetic neuropathy, unspecified: Secondary | ICD-10-CM | POA: Diagnosis present

## 2016-06-20 DIAGNOSIS — I10 Essential (primary) hypertension: Secondary | ICD-10-CM | POA: Diagnosis present

## 2016-06-20 DIAGNOSIS — K3532 Acute appendicitis with perforation and localized peritonitis, without abscess: Secondary | ICD-10-CM | POA: Diagnosis present

## 2016-06-20 DIAGNOSIS — L409 Psoriasis, unspecified: Secondary | ICD-10-CM | POA: Diagnosis present

## 2016-06-20 DIAGNOSIS — I2510 Atherosclerotic heart disease of native coronary artery without angina pectoris: Secondary | ICD-10-CM | POA: Diagnosis present

## 2016-06-20 DIAGNOSIS — Z794 Long term (current) use of insulin: Secondary | ICD-10-CM

## 2016-06-20 DIAGNOSIS — E1165 Type 2 diabetes mellitus with hyperglycemia: Secondary | ICD-10-CM | POA: Diagnosis present

## 2016-06-20 HISTORY — PX: LAPAROSCOPIC APPENDECTOMY: SHX408

## 2016-06-20 LAB — CBC
HCT: 38.4 % — ABNORMAL LOW (ref 39.0–52.0)
Hemoglobin: 12.6 g/dL — ABNORMAL LOW (ref 13.0–17.0)
MCH: 25.6 pg — ABNORMAL LOW (ref 26.0–34.0)
MCHC: 32.8 g/dL (ref 30.0–36.0)
MCV: 78 fL (ref 78.0–100.0)
Platelets: 409 10*3/uL — ABNORMAL HIGH (ref 150–400)
RBC: 4.92 MIL/uL (ref 4.22–5.81)
RDW: 14.5 % (ref 11.5–15.5)
WBC: 16.2 10*3/uL — AB (ref 4.0–10.5)

## 2016-06-20 LAB — COMPREHENSIVE METABOLIC PANEL
ALK PHOS: 67 U/L (ref 39–117)
ALK PHOS: 74 U/L (ref 38–126)
ALT: 18 U/L (ref 0–53)
ALT: 19 U/L (ref 17–63)
AST: 13 U/L (ref 0–37)
AST: 16 U/L (ref 15–41)
Albumin: 3.9 g/dL (ref 3.5–5.2)
Albumin: 3.2 g/dL — ABNORMAL LOW (ref 3.5–5.0)
Anion gap: 9 (ref 5–15)
BILIRUBIN TOTAL: 0.5 mg/dL (ref 0.2–1.2)
BILIRUBIN TOTAL: 0.6 mg/dL (ref 0.3–1.2)
BUN: 8 mg/dL (ref 6–23)
BUN: 6 mg/dL (ref 6–20)
CALCIUM: 8.9 mg/dL (ref 8.9–10.3)
CHLORIDE: 100 mmol/L — AB (ref 101–111)
CO2: 29 mEq/L (ref 19–32)
CO2: 25 mmol/L (ref 22–32)
CREATININE: 0.97 mg/dL (ref 0.61–1.24)
Calcium: 8.8 mg/dL (ref 8.4–10.5)
Chloride: 96 mEq/L (ref 96–112)
Creatinine, Ser: 0.88 mg/dL (ref 0.40–1.50)
GFR calc Af Amer: >60 mL/min (ref >60)
GFR: 96.2 mL/min (ref >60.00)
GLUCOSE: 147 mg/dL — AB (ref 70–99)
Glucose, Bld: 266 mg/dL — ABNORMAL HIGH (ref 65–99)
POTASSIUM: 4 meq/L (ref 3.5–5.1)
Potassium: 4.4 mmol/L (ref 3.5–5.1)
SODIUM: 134 meq/L — AB (ref 135–145)
Sodium: 134 mmol/L — ABNORMAL LOW (ref 135–145)
TOTAL PROTEIN: 7.2 g/dL (ref 6.5–8.1)
Total Protein: 7.3 g/dL (ref 6.0–8.3)

## 2016-06-20 LAB — CBC WITH DIFFERENTIAL/PLATELET
BASOS PCT: 0.2 % (ref 0.0–3.0)
Basophils Absolute: 0 10*3/uL (ref 0.0–0.1)
EOS ABS: 0.1 10*3/uL (ref 0.0–0.7)
Eosinophils Relative: 0.5 % (ref 0.0–5.0)
HCT: 39.6 % (ref 39.0–52.0)
HEMOGLOBIN: 13 g/dL (ref 13.0–17.0)
LYMPHS ABS: 1.9 10*3/uL (ref 0.7–4.0)
Lymphocytes Relative: 12.1 % (ref 12.0–46.0)
MCHC: 32.8 g/dL (ref 30.0–36.0)
MCV: 75.3 fl — ABNORMAL LOW (ref 78.0–100.0)
MONO ABS: 0.9 10*3/uL (ref 0.1–1.0)
Monocytes Relative: 6 % (ref 3.0–12.0)
NEUTROS ABS: 12.8 10*3/uL — AB (ref 1.4–7.7)
NEUTROS PCT: 81.2 % — AB (ref 43.0–77.0)
PLATELETS: 346 10*3/uL (ref 150.0–400.0)
RBC: 5.25 Mil/uL (ref 4.22–5.81)
RDW: 15.9 % — AB (ref 11.5–15.5)
WBC: 15.7 10*3/uL — AB (ref 4.0–10.5)

## 2016-06-20 LAB — URINALYSIS, ROUTINE W REFLEX MICROSCOPIC
Bilirubin Urine: NEGATIVE
GLUCOSE, UA: 100 mg/dL — AB
Hgb urine dipstick: NEGATIVE
KETONES UR: NEGATIVE mg/dL
LEUKOCYTES UA: NEGATIVE
Nitrite: NEGATIVE
PROTEIN: NEGATIVE mg/dL
Specific Gravity, Urine: >1.046 — ABNORMAL HIGH (ref 1.005–1.030)
pH: 6.5 (ref 5.0–8.0)

## 2016-06-20 LAB — LIPASE, BLOOD: Lipase: 19 U/L (ref 11–51)

## 2016-06-20 LAB — GLUCOSE, CAPILLARY: GLUCOSE-CAPILLARY: 323 mg/dL — AB (ref 65–99)

## 2016-06-20 LAB — CBG MONITORING, ED: Glucose-Capillary: 236 mg/dL — ABNORMAL HIGH (ref 65–99)

## 2016-06-20 SURGERY — APPENDECTOMY, LAPAROSCOPIC
Anesthesia: General | Site: Abdomen

## 2016-06-20 MED ORDER — SODIUM CHLORIDE 0.9 % IV BOLUS (SEPSIS): 1000.0000 mL | Freq: Once | INTRAVENOUS | Status: DC

## 2016-06-20 MED ORDER — MEPERIDINE HCL 25 MG/ML IJ SOLN: 6.2500 mg | INTRAMUSCULAR | Status: DC | PRN

## 2016-06-20 MED ORDER — SODIUM CHLORIDE 0.9 % IV SOLN
3.0000 g | Freq: Three times a day (TID) | INTRAVENOUS | Status: DC
  Filled 2016-06-20 (×2): qty 3

## 2016-06-20 MED ORDER — FENTANYL CITRATE (PF) 100 MCG/2ML IJ SOLN
INTRAMUSCULAR | Status: AC
  Filled 2016-06-20: qty 2

## 2016-06-20 MED ORDER — HYDROMORPHONE HCL 1 MG/ML IJ SOLN
INTRAMUSCULAR | Status: AC
Start: 2016-06-20 — End: 2016-06-21
  Filled 2016-06-20: qty 1

## 2016-06-20 MED ORDER — SUGAMMADEX SODIUM 500 MG/5ML IV SOLN
INTRAVENOUS | Status: DC | PRN
  Administered 2016-06-20: 500 mg via INTRAVENOUS

## 2016-06-20 MED ORDER — IOPAMIDOL (ISOVUE-300) INJECTION 61%
INTRAVENOUS | Status: AC
  Administered 2016-06-20: 100 mL
  Filled 2016-06-20: qty 100

## 2016-06-20 MED ORDER — MIDAZOLAM HCL 5 MG/5ML IJ SOLN
INTRAMUSCULAR | Status: DC | PRN
  Administered 2016-06-20: 2 mg via INTRAVENOUS

## 2016-06-20 MED ORDER — MIDAZOLAM HCL 2 MG/2ML IJ SOLN
INTRAMUSCULAR | Status: AC
  Filled 2016-06-20: qty 2

## 2016-06-20 MED ORDER — HYDROMORPHONE HCL 1 MG/ML IJ SOLN
INTRAMUSCULAR | Status: AC
  Filled 2016-06-20: qty 1

## 2016-06-20 MED ORDER — LIDOCAINE HCL (CARDIAC) 20 MG/ML IV SOLN
INTRAVENOUS | Status: DC | PRN
  Administered 2016-06-20: 60 mg via INTRAVENOUS

## 2016-06-20 MED ORDER — SUCCINYLCHOLINE CHLORIDE 20 MG/ML IJ SOLN
INTRAMUSCULAR | Status: DC | PRN
  Administered 2016-06-20: 120 mg via INTRAVENOUS

## 2016-06-20 MED ORDER — ONDANSETRON HCL 4 MG/2ML IJ SOLN
INTRAMUSCULAR | Status: DC | PRN
  Administered 2016-06-20: 4 mg via INTRAVENOUS

## 2016-06-20 MED ORDER — PIPERACILLIN-TAZOBACTAM 3.375 G IVPB
3.3750 g | Freq: Three times a day (TID) | INTRAVENOUS | Status: DC
  Administered 2016-06-20: 3.375 g via INTRAVENOUS
  Filled 2016-06-20: qty 50

## 2016-06-20 MED ORDER — BUPIVACAINE-EPINEPHRINE 0.25% -1:200000 IJ SOLN
INTRAMUSCULAR | Status: DC | PRN
  Administered 2016-06-20: 7 mL

## 2016-06-20 MED ORDER — HYDROMORPHONE HCL 1 MG/ML IJ SOLN
0.2500 mg | INTRAMUSCULAR | Status: DC | PRN
  Administered 2016-06-21 (×2): 0.5 mg via INTRAVENOUS

## 2016-06-20 MED ORDER — SODIUM CHLORIDE 0.9 % IR SOLN
Status: DC | PRN
  Administered 2016-06-20: 3000 mL

## 2016-06-20 MED ORDER — ONDANSETRON HCL 4 MG/2ML IJ SOLN
INTRAMUSCULAR | Status: AC
  Filled 2016-06-20: qty 2

## 2016-06-20 MED ORDER — SODIUM CHLORIDE 0.9 % IV BOLUS (SEPSIS)
1000.0000 mL | Freq: Once | INTRAVENOUS | Status: AC
  Administered 2016-06-20: 1000 mL via INTRAVENOUS

## 2016-06-20 MED ORDER — PROMETHAZINE HCL 25 MG/ML IJ SOLN
6.2500 mg | INTRAMUSCULAR | Status: DC | PRN
Start: 2016-06-20 — End: 2016-06-21

## 2016-06-20 MED ORDER — LACTATED RINGERS IV SOLN
INTRAVENOUS | Status: DC
  Administered 2016-06-21: 01:00:00 via INTRAVENOUS

## 2016-06-20 MED ORDER — FENTANYL CITRATE (PF) 100 MCG/2ML IJ SOLN
INTRAMUSCULAR | Status: DC | PRN
  Administered 2016-06-20 (×3): 100 ug via INTRAVENOUS
  Administered 2016-06-20 (×4): 50 ug via INTRAVENOUS

## 2016-06-20 MED ORDER — PROPOFOL 10 MG/ML IV BOLUS
INTRAVENOUS | Status: DC | PRN
  Administered 2016-06-20: 150 mg via INTRAVENOUS

## 2016-06-20 MED ORDER — HYDROMORPHONE HCL 1 MG/ML IJ SOLN
INTRAMUSCULAR | Status: DC | PRN
  Administered 2016-06-20: 1 mg via INTRAVENOUS

## 2016-06-20 MED ORDER — FENTANYL CITRATE (PF) 100 MCG/2ML IJ SOLN
50.0000 ug | INTRAMUSCULAR | Status: DC | PRN
  Administered 2016-06-20: 100 ug via INTRAVENOUS
  Filled 2016-06-20: qty 2

## 2016-06-20 MED ORDER — ROCURONIUM BROMIDE 100 MG/10ML IV SOLN
INTRAVENOUS | Status: DC | PRN
  Administered 2016-06-20: 50 mg via INTRAVENOUS
  Administered 2016-06-20: 30 mg via INTRAVENOUS
  Administered 2016-06-20: 20 mg via INTRAVENOUS
  Administered 2016-06-20: 30 mg via INTRAVENOUS
  Administered 2016-06-20: 20 mg via INTRAVENOUS

## 2016-06-20 MED ORDER — 0.9 % SODIUM CHLORIDE (POUR BTL) OPTIME
TOPICAL | Status: DC | PRN
  Administered 2016-06-20 (×4): 1000 mL

## 2016-06-20 MED ORDER — LACTATED RINGERS IV SOLN
INTRAVENOUS | Status: DC | PRN
  Administered 2016-06-20 (×3): via INTRAVENOUS

## 2016-06-20 SURGICAL SUPPLY — 72 items
APPLIER CLIP ROT 10 11.4 M/L (STAPLE)
BANDAGE ADH SHEER 1  50/CT (GAUZE/BANDAGES/DRESSINGS) IMPLANT
BENZOIN TINCTURE PRP APPL 2/3 (GAUZE/BANDAGES/DRESSINGS) IMPLANT
BIOPATCH RED 1 DISK 7.0 (GAUZE/BANDAGES/DRESSINGS) ×2 IMPLANT
BLADE SURG 15 STRL LF DISP TIS (BLADE) ×1 IMPLANT
BLADE SURG 15 STRL SS (BLADE) ×1
BLADE SURG ROTATE 9660 (MISCELLANEOUS) IMPLANT
BNDG GAUZE ELAST 4 BULKY (GAUZE/BANDAGES/DRESSINGS) ×2 IMPLANT
CANISTER SUCTION 2500CC (MISCELLANEOUS) ×2 IMPLANT
CHLORAPREP W/TINT 26ML (MISCELLANEOUS) ×2 IMPLANT
CLIP APPLIE ROT 10 11.4 M/L (STAPLE) IMPLANT
COVER SURGICAL LIGHT HANDLE (MISCELLANEOUS) ×2 IMPLANT
CUTTER FLEX LINEAR 45M (STAPLE) ×2 IMPLANT
DERMABOND ADVANCED (GAUZE/BANDAGES/DRESSINGS)
DERMABOND ADVANCED .7 DNX12 (GAUZE/BANDAGES/DRESSINGS) IMPLANT
DRAIN CHANNEL 19F RND (DRAIN) ×2 IMPLANT
DRSG PAD ABDOMINAL 8X10 ST (GAUZE/BANDAGES/DRESSINGS) ×2 IMPLANT
DRSG TEGADERM 2-3/8X2-3/4 SM (GAUZE/BANDAGES/DRESSINGS) ×2 IMPLANT
DRSG TEGADERM 4X4.75 (GAUZE/BANDAGES/DRESSINGS) IMPLANT
ELECT BLADE 4.0 EZ CLEAN MEGAD (MISCELLANEOUS) ×2
ELECT REM PT RETURN 9FT ADLT (ELECTROSURGICAL) ×2
ELECTRODE BLDE 4.0 EZ CLN MEGD (MISCELLANEOUS) ×1 IMPLANT
ELECTRODE REM PT RTRN 9FT ADLT (ELECTROSURGICAL) ×1 IMPLANT
ENDOLOOP SUT PDS II  0 18 (SUTURE)
ENDOLOOP SUT PDS II 0 18 (SUTURE) IMPLANT
EVACUATOR SILICONE 100CC (DRAIN) ×2 IMPLANT
GAUZE SPONGE 2X2 8PLY STRL LF (GAUZE/BANDAGES/DRESSINGS) ×1 IMPLANT
GLOVE BIO SURGEON STRL SZ7 (GLOVE) ×2 IMPLANT
GLOVE BIOGEL M 7.0 STRL (GLOVE) ×2 IMPLANT
GLOVE BIOGEL M STRL SZ7.5 (GLOVE) ×2 IMPLANT
GLOVE BIOGEL PI IND STRL 7.0 (GLOVE) ×1 IMPLANT
GLOVE BIOGEL PI IND STRL 7.5 (GLOVE) ×1 IMPLANT
GLOVE BIOGEL PI IND STRL 8 (GLOVE) ×1 IMPLANT
GLOVE BIOGEL PI INDICATOR 7.0 (GLOVE) ×1
GLOVE BIOGEL PI INDICATOR 7.5 (GLOVE) ×1
GLOVE BIOGEL PI INDICATOR 8 (GLOVE) ×1
GOWN STRL REUS W/ TWL LRG LVL3 (GOWN DISPOSABLE) ×4 IMPLANT
GOWN STRL REUS W/TWL 2XL LVL3 (GOWN DISPOSABLE) ×2 IMPLANT
GOWN STRL REUS W/TWL LRG LVL3 (GOWN DISPOSABLE) ×4
HANDLE SUCTION POOLE (INSTRUMENTS) ×1 IMPLANT
KIT BASIN OR (CUSTOM PROCEDURE TRAY) ×2 IMPLANT
KIT ROOM TURNOVER OR (KITS) ×2 IMPLANT
LIGASURE IMPACT 36 18CM CVD LR (INSTRUMENTS) ×2 IMPLANT
NS IRRIG 1000ML POUR BTL (IV SOLUTION) ×2 IMPLANT
PAD ARMBOARD 7.5X6 YLW CONV (MISCELLANEOUS) ×4 IMPLANT
PENCIL BUTTON HOLSTER BLD 10FT (ELECTRODE) ×2 IMPLANT
POUCH RETRIEVAL ECOSAC 10 (ENDOMECHANICALS) ×1 IMPLANT
POUCH RETRIEVAL ECOSAC 10MM (ENDOMECHANICALS) ×1
RELOAD 45 VASCULAR/THIN (ENDOMECHANICALS) IMPLANT
RELOAD STAPLE TA45 3.5 REG BLU (ENDOMECHANICALS) ×2 IMPLANT
SCALPEL HARMONIC ACE (MISCELLANEOUS) ×4 IMPLANT
SCISSORS LAP 5X35 DISP (ENDOMECHANICALS) IMPLANT
SET IRRIG TUBING LAPAROSCOPIC (IRRIGATION / IRRIGATOR) ×2 IMPLANT
SPECIMEN JAR SMALL (MISCELLANEOUS) ×2 IMPLANT
SPONGE GAUZE 2X2 STER 10/PKG (GAUZE/BANDAGES/DRESSINGS) ×1
SPONGE LAP 18X18 X RAY DECT (DISPOSABLE) ×4 IMPLANT
STAPLER SKIN 35 WIDE (STAPLE) ×2 IMPLANT
STRIP CLOSURE SKIN 1/2X4 (GAUZE/BANDAGES/DRESSINGS) IMPLANT
SUCTION POOLE HANDLE (INSTRUMENTS) ×2
SUT ETHILON 2 0 FS 18 (SUTURE) ×2 IMPLANT
SUT MNCRL AB 4-0 PS2 18 (SUTURE) ×2 IMPLANT
SUT PDS AB 1 TP1 96 (SUTURE) ×4 IMPLANT
SUT SILK 3 0 SH CR/8 (SUTURE) ×4 IMPLANT
SUT VICRYL 0 UR6 27IN ABS (SUTURE) IMPLANT
TOWEL OR 17X24 6PK STRL BLUE (TOWEL DISPOSABLE) ×2 IMPLANT
TOWEL OR 17X26 10 PK STRL BLUE (TOWEL DISPOSABLE) ×2 IMPLANT
TRAY FOLEY CATH 16FR SILVER (SET/KITS/TRAYS/PACK) ×2 IMPLANT
TRAY LAPAROSCOPIC MC (CUSTOM PROCEDURE TRAY) ×2 IMPLANT
TROCAR XCEL BLADELESS 5X75MML (TROCAR) ×4 IMPLANT
TROCAR XCEL BLUNT TIP 100MML (ENDOMECHANICALS) ×2 IMPLANT
TUBE CONNECTING 12X1/4 (SUCTIONS) ×2 IMPLANT
TUBING INSUFFLATION (TUBING) ×2 IMPLANT

## 2016-06-20 NOTE — ED Provider Notes (Signed)
[facility] DEPT Provider Note   CSN: [id number] Arrival date & time: 06/20/16  1412     History   Chief Complaint Chief Complaint  Patient presents with  . Abdominal Pain    HPI Steve [name] is a 55 y.o. male.  The history is provided by the patient.  Abdominal Pain   This is a new problem. The current episode started more than 1 week ago. The problem occurs constantly. The problem has been gradually worsening. The pain is associated with a previous surgery. The pain is located in the RLQ, LLQ and suprapubic region. The pain is at a severity of 4/10. The pain is moderate. Associated symptoms include anorexia and fever. Pertinent negatives include melena, constipation, dysuria, hematuria, arthralgias and myalgias. The symptoms are aggravated by palpation. Nothing relieves the symptoms. Past workup includes surgery. Past workup comments: Bilateral hernia surgeries.      Past Medical History:  Diagnosis Date  . ADHD (attention deficit hyperactivity disorder)   . Allergy   . Anxiety   . CAD (coronary artery disease)   . Complication of anesthesia    has night terrors after anesthesia but can be violent when waking up  . Depression   . Diabetes mellitus without complication ([place])   . Frequent urination at night    when blood sugar runs high  . Hyperlipidemia   . Hypertension   . 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 Pt stated "I forget alot of things"  . Psoriasis of scalp    nose and face; occurs mainly in winter   . Seasonal allergies   . Sinus infection 12/16/13    Patient Active Problem List   Diagnosis Date Noted  . Plantar fasciitis 01/10/2016  . Hammertoe 01/10/2016  . Deviated nasal septum 09/19/2015  . Abdominal pain 07/26/2015  . Mass of leg 06/18/2015  . Numbness of left foot 03/23/2015  . Cellulitis of leg, left 08/01/2014  . Accessory skin tags 08/01/2014  . Hypogonadism in male 07/24/2014  . Essential hypertension, benign 07/24/2014   . Post herpetic neuralgia 05/05/2014  . Skin lesion 04/09/2014  . Gout 12/06/2013  . Allergic rhinitis 09/07/2013  . Atypical chest pain 07/15/2013  . Left ankle pain 06/14/2013  . Rib injury 06/14/2013  . Pain in left ankle w/ effusion 05/24/2013  . Routine general medical examination at a health care facility 04/11/2013  . DM neuropathy, type II diabetes mellitus ([place]) 07/26/2012  . Hyperlipidemia 07/26/2012  . 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 ED (erectile dysfunction) 07/26/2012    Past Surgical History:  Procedure Laterality Date  . CARDIAC CATHETERIZATION     no PCI approx 10 years ago - [facility]     right ring finger  . HERNIA REPAIR  2010   abdominal hernia  . KNEE ARTHROSCOPY  2012   left knee  . KNEE SURGERY    . NASAL SEPTOPLASTY W/ TURBINOPLASTY  09/19/2015   Procedure: NASAL SEPTOPLASTY WITH TURBINATE REDUCTION;  Surgeon: Jodi Marble, MD;  Location: [facility];  Service: ENT;;  . TOTAL KNEE ARTHROPLASTY Left 12/28/2013   Procedure: TOTAL KNEE ARTHROPLASTY;  Surgeon: Ninetta Lights, MD;  Location: [facility];  Service: Orthopedics;  Laterality: Left;    OB History    No data available       Home Medications    Prior to Admission medications   Medication Sig Start Date End Date Taking? Authorizing  Provider  atorvastatin (LIPITOR) 10 MG tablet Take 1 tablet (10 mg total) by mouth daily. 05/19/16   Brunetta Jeans, PA-C  dapagliflozin propanediol (FARXIGA) 5 MG TABS tablet Take 5 mg by mouth daily. 05/19/16   Brunetta Jeans, PA-C  dicyclomine (BENTYL) 10 MG capsule Take 1 capsule (10 mg total) by mouth 4 (four) times daily -  before meals and at bedtime. 06/19/16   Crosby Oyster Wendling, DO  fenofibrate (TRICOR) 145 MG tablet Take 1 tablet (145 mg total) by mouth daily. 03/19/16   Brunetta Jeans, PA-C  gabapentin (NEURONTIN) 300 MG capsule Take 600 mg each AM, 300 mg noon and 600 mg PM. 12/18/15   Brunetta Jeans, PA-C  insulin  lispro (HUMALOG KWIKPEN) 100 UNIT/ML KiwkPen Inject 0-12 Units into the skin 3 (three) times daily with meals. sliding scale- check sugar and inject 3 times daily before meals as below: <150- Zero units 150-200 2 units 201-250 4 units 251-300 6 units 301-350 8 units 351-400 10 units >400 12 units and contact us. 03/24/16   Brunetta Jeans, PA-C  insulin NPH Human (HUMULIN N,NOVOLIN N) 100 UNIT/ML injection 30 units in the morning and 30-40 units at bedtime. 03/24/16   Brunetta Jeans, PA-C  lisinopril (PRINIVIL,ZESTRIL) 5 MG tablet Take 1 tablet (5 mg total) by mouth daily. 03/24/16   Brunetta Jeans, PA-C  metFORMIN (GLUCOPHAGE) 1000 MG tablet Take 1 tablet (1,000 mg total) by mouth 2 (two) times daily with a meal. 06/19/16   Shelda Pal, DO  Multiple Vitamins-Minerals (ZINC PO) Take 1 tablet by mouth daily.     Historical Provider, MD  oxyCODONE-acetaminophen (PERCOCET) 5-325 MG tablet Take 1 tablet by mouth every 4 (four) hours as needed for moderate pain. 05/19/16   Brunetta Jeans, PA-C  traMADol (ULTRAM) 50 MG tablet Take 1 tablet (50 mg total) by mouth every 12 (twelve) hours as needed. 05/19/16   Brunetta Jeans, PA-C    Family History Family History  Problem Relation Age of Onset  . Diabetes Mother   . Cancer Mother     history breast cancer  . Hypertension Mother   . Cancer Father     prostate  . Alzheimer's disease Father   . Diabetes Father   . Hypertension Father   . Cancer Maternal Uncle     colon  . Colon cancer Maternal Uncle 49  . Cancer Maternal Uncle     prostate  . Colon cancer Maternal Uncle 50  . Colon polyps Paternal Uncle   . Cancer Maternal Grandmother     breast  . Cancer Maternal Grandfather     prostate  . Cancer Maternal Uncle     prostate  . Cancer Cousin 47    metastatic colon cancer?  . Esophageal cancer Neg Hx   . Stomach cancer Neg Hx   . Rectal cancer Neg Hx     Social History Social History  Substance Use Topics  . Smoking status:  Never Smoker  . Smokeless tobacco: Never Used  . Alcohol use 0.6 - 1.2 oz/week    1 - 2 Cans of beer per week     Comment: rare beer     Allergies   Sulfa antibiotics   Review of Systems Review of Systems  Constitutional: Positive for fatigue and fever. Negative for activity change.  Respiratory: Negative for shortness of breath.   Cardiovascular: Negative for chest pain.  Gastrointestinal: Positive for abdominal pain and anorexia. Negative  for constipation and melena.  Genitourinary: Negative for dysuria and hematuria.  Musculoskeletal: Negative for arthralgias and myalgias.     Physical Exam Updated Vital Signs BP 134/75 (BP Location: Right Arm)   Pulse 116   Temp 99.8 F (37.7 C) (Oral)   Resp 16   SpO2 97%   Physical Exam  Constitutional: He is oriented to person, place, and time. He appears well-nourished.  HENT:  Head: Normocephalic.  Eyes: Conjunctivae are normal.  Cardiovascular: Normal rate and regular rhythm.   Pulmonary/Chest: Effort normal and breath sounds normal.  Abdominal: Soft.  Tenderness to bilateral lower quadrants as well as suprapubically.  Genitourinary: Penis normal.  Musculoskeletal: Normal range of motion.  Neurological: He is oriented to person, place, and time.  Skin: Skin is warm and dry. He is not diaphoretic.  Psychiatric: He has a normal mood and affect. His behavior is normal.     ED Treatments / Results  Labs (all labs ordered are listed, but only abnormal results are displayed) Labs Reviewed  COMPREHENSIVE METABOLIC PANEL - Abnormal; Notable for the following:       Result Value   Sodium 134 (*)    Chloride 100 (*)    Glucose, Bld 266 (*)    Albumin 3.2 (*)    All other components within normal limits  CBC - Abnormal; Notable for the following:    WBC 16.2 (*)    Hemoglobin 12.6 (*)    HCT 38.4 (*)    MCH 25.6 (*)    Platelets 409 (*)    All other components within normal limits  CBG MONITORING, ED - Abnormal;  Notable for the following:    Glucose-Capillary 236 (*)    All other components within normal limits  URINE CULTURE  LIPASE, BLOOD  URINALYSIS, ROUTINE W REFLEX MICROSCOPIC (NOT AT [facility])    EKG  EKG Interpretation  Date/Time:  Friday June 20 2016 18:48:59 EDT Ventricular Rate:  99 PR Interval:    QRS Duration: 93 QT Interval:  336 QTC Calculation: 432 R Axis:   -15 Text Interpretation:  Sinus rhythm Borderline left axis deviation No significant change since last tracing Confirmed by Gerald Leitz ([id number]) on 06/20/2016 6:51:26 PM       Radiology Ct Abdomen Pelvis W Contrast  Result Date: 06/20/2016 CLINICAL DATA:  Lower abdominal pain for 2 weeks EXAM: CT ABDOMEN AND PELVIS WITH CONTRAST TECHNIQUE: Multidetector CT imaging of the abdomen and pelvis was performed using the standard protocol following bolus administration of intravenous contrast. CONTRAST:  138mL ISOVUE-300 IOPAMIDOL (ISOVUE-300) INJECTION 61% COMPARISON:  03/13/2016 FINDINGS: Lower chest: No acute abnormality. Hepatobiliary: No focal liver abnormality is seen. No gallstones, gallbladder wall thickening, or biliary dilatation.Mild hepatic steatosis. Pancreas: Unremarkable. No pancreatic ductal dilatation or surrounding inflammatory changes. Spleen: Normal in size without focal abnormality. Adrenals/Urinary Tract: Adrenal glands are unremarkable. Kidneys are normal, without renal calculi, focal lesion, or hydronephrosis. Bladder is unremarkable. Stomach/Bowel: Stomach is within normal limits. Marked right lower quadrant inflammatory changes are identified. The appendix appears markedly thickened with surrounding fat stranding. There is mild wall thickening involving the terminal ileum, image 70 of series 201. No discrete abscess identified. Mild wall thickening involving the sigmoid colon is identified as it courses through the right lower quadrant of the abdomen. Vascular/Lymphatic: Normal appearance of the abdominal  aorta. No enlarged retroperitoneal or mesenteric adenopathy. No enlarged pelvic or inguinal lymph nodes. Reproductive: Prostate is unremarkable. Other: No abdominal wall hernia or abnormality. No abdominopelvic ascites. Musculoskeletal: No  acute or significant osseous findings. IMPRESSION: 1. Examination is positive for marked right lower quadrant inflammatory change. This is favored to represent sequelae of acute appendicitis. Alternatively findings may reflect an infectious or inflammatory enteritis with secondary inflammation of the appendix. 2. No pathologic dilatation of the large or small bowel loops to suggest obstruction. No drainable abscess identified. Electronically Signed   By: Kerby Moors M.D.   On: 06/20/2016 17:52    Procedures Procedures (including critical care time)  Medications Ordered in ED Medications  Ampicillin-Sulbactam (UNASYN) 3 g in sodium chloride 0.9 % 100 mL IVPB (not administered)  iopamidol (ISOVUE-300) 61 % injection (100 mLs  Contrast Given 06/20/16 1723)  sodium chloride 0.9 % bolus 1,000 mL (1,000 mLs Intravenous New Bag/Given 06/20/16 1851)     Initial Impression / Assessment and Plan / ED Course  I have reviewed the triage vital signs and the nursing notes.  Pertinent labs & imaging results that were available during my care of the patient were reviewed by me and considered in my medical decision making (see chart for details).  Clinical Course    Patient is a pleasant 55 year old male presenting with abdominal pain. This been going on for last week. Patient's primary care physician had labs drawn, had an elevated white count was sent here for further evaluation. He said he thought it might be constipation into patient has had a clean up. No blood in the stools. No urinary symptoms. Differential includes diverticulitis, incarcerated hernias, urinary tract infection, kidney stones.,  Awaiting results of CAT scan. Awaiting UA.  CT shows enteritis vs appy.  Surgery consulted, abx given.     Final Clinical Impressions(s) / ED Diagnoses   Final diagnoses:  None    New Prescriptions New Prescriptions   No medications on file     Charan Prieto Julio Alm, MD 06/20/16 1858

## 2016-06-20 NOTE — H&P (Signed)
Steve [name] is an 55 y.o. male.   Chief Complaint: abd pain HPI: 55 yo obese WM with CAD, HTN, DM, mild OSA developed periumbilical pain last Wednesday which was intermittent. Pain became constant last Friday - points to RLQ near umbilicus. Low grade temp. Had nausea. Initially thought constipated so took colon cleanse which "cleaned me out" but pain persisted. Was able to eat/drink. Because pain persisted went to UC late yesterday. Started on abx and sent for labs. Wbc was 16 so was contacted and told to go to ED for CT. No prior complaints. No h/o IBS, crohns, UC. Still with some loose stools. Can't get comfortable. Moving makes worse.   Reports remote h/o of abd wall hernia repair at [facility]. States he had 2 small cuts (one of each of umbilicus and mesh was put in)  Past Medical History:  Diagnosis Date  . ADHD (attention deficit hyperactivity disorder)   . Allergy   . Anxiety   . CAD (coronary artery disease)   . Complication of anesthesia    has night terrors after anesthesia but can be violent when waking up  . Depression   . Diabetes mellitus without complication ([place])   . Frequent urination at night    when blood sugar runs high  . Hyperlipidemia   . Hypertension   . 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 Pt stated "I forget alot of things"  . Psoriasis of scalp    nose and face; occurs mainly in winter   . Seasonal allergies   . Sinus infection 12/16/13    Past Surgical History:  Procedure Laterality Date  . CARDIAC CATHETERIZATION     no PCI approx 10 years ago - [place] [facility]     right ring finger  . HERNIA REPAIR  2010   abdominal hernia  . KNEE ARTHROSCOPY  2012   left knee  . KNEE SURGERY    . NASAL SEPTOPLASTY W/ TURBINOPLASTY  09/19/2015   Procedure: NASAL SEPTOPLASTY WITH TURBINATE REDUCTION;  Surgeon: Jodi Marble, MD;  Location: [facility];  Service: ENT;;  . TOTAL KNEE ARTHROPLASTY Left 12/28/2013   Procedure: TOTAL KNEE  ARTHROPLASTY;  Surgeon: Ninetta Lights, MD;  Location: [facility];  Service: Orthopedics;  Laterality: Left;    Family History  Problem Relation Age of Onset  . Diabetes Mother   . Cancer Mother     history breast cancer  . Hypertension Mother   . Cancer Father     prostate  . Alzheimer's disease Father   . Diabetes Father   . Hypertension Father   . Cancer Maternal Uncle     colon  . Colon cancer Maternal Uncle 79  . Cancer Maternal Uncle     prostate  . Colon cancer Maternal Uncle 64  . Colon polyps Paternal Uncle   . Cancer Maternal Grandmother     breast  . Cancer Maternal Grandfather     prostate  . Cancer Maternal Uncle     prostate  . Cancer Cousin 47    metastatic colon cancer?  . Esophageal cancer Neg Hx   . Stomach cancer Neg Hx   . Rectal cancer Neg Hx    Social History:  reports that he has never smoked. He has never used smokeless tobacco. He reports that he drinks about 0.6 - 1.2 oz of alcohol per week . He reports that he does not use drugs.  Allergies:  Allergies  Allergen Reactions  . Sulfa Antibiotics Itching and Other (See Comments)    Burning also     (Not in a hospital admission)  Results for orders placed or performed during the hospital encounter of 06/20/16 (from the past 48 hour(s))  Lipase, blood     Status: None   Collection Time: 06/20/16  2:22 PM  Result Value Ref Range   Lipase 19 11 - 51 U/L  Comprehensive metabolic panel     Status: Abnormal   Collection Time: 06/20/16  2:22 PM  Result Value Ref Range   Sodium 134 (L) 135 - 145 mmol/L   Potassium 4.4 3.5 - 5.1 mmol/L   Chloride 100 (L) 101 - 111 mmol/L   CO2 25 22 - 32 mmol/L   Glucose, Bld 266 (H) 65 - 99 mg/dL   BUN 6 6 - 20 mg/dL   Creatinine, Ser 0.97 0.61 - 1.24 mg/dL   Calcium 8.9 8.9 - 10.3 mg/dL   Total Protein 7.2 6.5 - 8.1 g/dL   Albumin 3.2 (L) 3.5 - 5.0 g/dL   AST 16 15 - 41 U/L   ALT 19 17 - 63 U/L   Alkaline Phosphatase 74 38 - 126 U/L   Total Bilirubin 0.6  0.3 - 1.2 mg/dL   GFR calc non Af Amer >60 >60 mL/min   GFR calc Af Amer >60 >60 mL/min    Comment: (NOTE) The eGFR has been calculated using the CKD EPI equation. This calculation has not been validated in all clinical situations. eGFR's persistently <60 mL/min signify possible Chronic Kidney Disease.    Anion gap 9 5 - 15  CBC     Status: Abnormal   Collection Time: 06/20/16  2:22 PM  Result Value Ref Range   WBC 16.2 (H) 4.0 - 10.5 K/uL   RBC 4.92 4.22 - 5.81 MIL/uL   Hemoglobin 12.6 (L) 13.0 - 17.0 g/dL   HCT 38.4 (L) 39.0 - 52.0 %   MCV 78.0 78.0 - 100.0 fL   MCH 25.6 (L) 26.0 - 34.0 pg   MCHC 32.8 30.0 - 36.0 g/dL   RDW 14.5 11.5 - 15.5 %   Platelets 409 (H) 150 - 400 K/uL  CBG monitoring, ED     Status: Abnormal   Collection Time: 06/20/16  5:59 PM  Result Value Ref Range   Glucose-Capillary 236 (H) 65 - 99 mg/dL   Comment 1 Notify RN   Urinalysis, Routine w reflex microscopic     Status: Abnormal   Collection Time: 06/20/16  6:18 PM  Result Value Ref Range   Color, Urine YELLOW YELLOW   APPearance CLEAR CLEAR   Specific Gravity, Urine >1.046 (H) 1.005 - 1.030   pH 6.5 5.0 - 8.0   Glucose, UA 100 (A) NEGATIVE mg/dL   Hgb urine dipstick NEGATIVE NEGATIVE   Bilirubin Urine NEGATIVE NEGATIVE   Ketones, ur NEGATIVE NEGATIVE mg/dL   Protein, ur NEGATIVE NEGATIVE mg/dL   Nitrite NEGATIVE NEGATIVE   Leukocytes, UA NEGATIVE NEGATIVE    Comment: MICROSCOPIC NOT DONE ON URINES WITH NEGATIVE PROTEIN, BLOOD, LEUKOCYTES, NITRITE, OR GLUCOSE <1000 mg/dL.   Ct Abdomen Pelvis W Contrast  Result Date: 06/20/2016 CLINICAL DATA:  Lower abdominal pain for 2 weeks EXAM: CT ABDOMEN AND PELVIS WITH CONTRAST TECHNIQUE: Multidetector CT imaging of the abdomen and pelvis was performed using the standard protocol following bolus administration of intravenous contrast. CONTRAST:  186m ISOVUE-300 IOPAMIDOL (ISOVUE-300) INJECTION 61% COMPARISON:  03/13/2016 FINDINGS: Lower chest: No acute  abnormality. Hepatobiliary: No focal liver abnormality is seen. No gallstones, gallbladder wall thickening, or biliary dilatation.Mild hepatic steatosis. Pancreas: Unremarkable. No pancreatic ductal dilatation or surrounding inflammatory changes. Spleen: Normal in size without focal abnormality. Adrenals/Urinary Tract: Adrenal glands are unremarkable. Kidneys are normal, without renal calculi, focal lesion, or hydronephrosis. Bladder is unremarkable. Stomach/Bowel: Stomach is within normal limits. Marked right lower quadrant inflammatory changes are identified. The appendix appears markedly thickened with surrounding fat stranding. There is mild wall thickening involving the terminal ileum, image 70 of series 201. No discrete abscess identified. Mild wall thickening involving the sigmoid colon is identified as it courses through the right lower quadrant of the abdomen. Vascular/Lymphatic: Normal appearance of the abdominal aorta. No enlarged retroperitoneal or mesenteric adenopathy. No enlarged pelvic or inguinal lymph nodes. Reproductive: Prostate is unremarkable. Other: No abdominal wall hernia or abnormality. No abdominopelvic ascites. Musculoskeletal: No acute or significant osseous findings. IMPRESSION: 1. Examination is positive for marked right lower quadrant inflammatory change. This is favored to represent sequelae of acute appendicitis. Alternatively findings may reflect an infectious or inflammatory enteritis with secondary inflammation of the appendix. 2. No pathologic dilatation of the large or small bowel loops to suggest obstruction. No drainable abscess identified. Electronically Signed   By: Kerby Moors M.D.   On: 06/20/2016 17:52    Review of Systems  Constitutional: Positive for fever. Negative for chills and weight loss.  HENT: Negative for nosebleeds.   Eyes: Negative for blurred vision.  Respiratory: Negative for shortness of breath.   Cardiovascular: Negative for chest pain,  palpitations, orthopnea and PND.       Denies DOE  Gastrointestinal: Positive for abdominal pain, diarrhea and nausea. Negative for blood in stool, constipation, heartburn and vomiting.  Genitourinary: Negative for dysuria and hematuria.  Musculoskeletal: Negative.   Skin: Negative for itching and rash.  Neurological: Negative for dizziness, focal weakness, seizures, loss of consciousness and headaches.       Denies TIAs, amaurosis fugax  Endo/Heme/Allergies: Does not bruise/bleed easily.  Psychiatric/Behavioral: The patient is not nervous/anxious.     Blood pressure 126/66, pulse 97, temperature 99.8 F (37.7 C), temperature source Oral, resp. rate 14, SpO2 96 %. Physical Exam  Vitals reviewed. Constitutional: He is oriented to person, place, and time. He appears well-developed and well-nourished. He appears ill. He appears distressed.  HENT:  Head: Normocephalic and atraumatic.  Right Ear: External ear normal.  Left Ear: External ear normal.  Eyes: Conjunctivae are normal. No scleral icterus.  Neck: Normal range of motion. Neck supple. No tracheal deviation present. No thyromegaly present.  Cardiovascular: Normal rate and normal heart sounds.   Respiratory: Effort normal and breath sounds normal. No stridor. No respiratory distress. He has no wheezes.  GI: Soft. There is tenderness in the right lower quadrant, periumbilical area and suprapubic area. There is guarding. There is no rigidity and no rebound. No hernia.    Very TTP in RLQ/periumbilical; + voluntary guarding, some localized peritonitis. No rebound  Musculoskeletal: He exhibits no edema or tenderness.  Lymphadenopathy:    He has no cervical adenopathy.  Neurological: He is alert and oriented to person, place, and time. He exhibits normal muscle tone.  Skin: Skin is warm and dry. No rash noted. He is not diaphoretic. No erythema. No pallor.  Some chronic skin changes of b/l LE  Psychiatric: He has a normal mood and  affect. His behavior is normal. Judgment and thought content normal.     Assessment/Plan Severe acute appendicitis HTN  OSA DM CAD Morbid obesity  Doubt this is IBS/enteritis. Discussed surgical mgmt vs abx trial. i'm concerned that appendectomy will be technically challenging given degree of inflammation in RLQ and higher risk for conversion to open and potential for ileocecectomy. Dicussed starting with abx and see how responds but discussed risk of abx failure and/or failure to improved.   We both agreed to proceed with appendectomy.   We discussed the etiology and management of acute appendicitis. We discussed operative and nonoperative management.  I recommended operative management along with IV antibiotics.  We discussed laparoscopic appendectomy. We discussed the risk and benefits of surgery including but not limited to bleeding, infection, injury to surrounding structures, need to convert to an open procedure, blood clot formation, post operative abscess or wound infection, staple line complications such as leak or bleeding, hernia formation, post operative ileus, need for additional procedures, anesthesia complications, and the typical postoperative course. I explained that the patient should expect a good improvement in their symptoms.  Fluid bolus. Change to zosyn  Leighton Ruff. Redmond Pulling, MD, [place], Bariatric, & Minimally Invasive Surgery [facility], [place]   Gayland Curry, MD 06/20/2016, 7:46 PM

## 2016-06-20 NOTE — ED Notes (Signed)
Pt's CBG 236. Informed Katie, Therapist, sports.

## 2016-06-20 NOTE — Anesthesia Procedure Notes (Signed)
Procedure Name: Intubation Date/Time: 06/20/2016 8:19 PM Performed by: Manuela Schwartz B Pre-anesthesia Checklist: Patient identified, Emergency Drugs available, Suction available, Patient being monitored and Timeout performed Patient Re-evaluated:Patient Re-evaluated prior to inductionOxygen Delivery Method: Circle system utilized Preoxygenation: Pre-oxygenation with 100% oxygen Intubation Type: IV induction, Rapid sequence and Cricoid Pressure applied Laryngoscope Size: Mac and 3 Grade View: Grade I Tube type: Oral Tube size: 7.5 mm Number of attempts: 1 Airway Equipment and Method: Stylet Placement Confirmation: ETT inserted through vocal cords under direct vision,  positive ETCO2 and breath sounds checked- equal and bilateral Secured at: 22 cm Tube secured with: Tape Dental Injury: Teeth and Oropharynx as per pre-operative assessment

## 2016-06-20 NOTE — Progress Notes (Signed)
Pharmacy Antibiotic Note  Steve [name] is a 55 y.o. male admitted on 06/20/2016 with intra-abdominal infection.  Pharmacy has been consulted for unasyn dosing.  Plan: Unasyn 3g IV q8h Monitor culture data, renal function and clinical course     Temp (24hrs), Avg:99.6 F (37.6 C), Min:99.4 F (37.4 C), Max:99.8 F (37.7 C)   Recent Labs Lab 06/19/16 1601 06/20/16 1422  WBC 15.7* 16.2*  CREATININE 0.88 0.97    Estimated Creatinine Clearance (by C-G formula based on SCr of 0.97 mg/dL) Male: 104.2 mL/min Male: 125.9 mL/min    Allergies  Allergen Reactions  . Sulfa Antibiotics Itching and Other (See Comments)    Burning also    Antimicrobials this admission: Unasyn 9/8 >>   Andrey Cota. Diona Foley, PharmD, [facility] Clinical Pharmacist Pager [phone] 06/20/2016 6:26 PM

## 2016-06-20 NOTE — Telephone Encounter (Signed)
Tried to call pt regarding lab results. He does have an elevated WBC. He is not feeling better and states he did feel feverish last night. No N/V or development of other symptoms. It is still located in the lower abd region, equal on both sides. I recommended that it may not be a bad idea to seek further care in the emergency department as acute appendicitis is a concern given his labwork and exam. The patient voiced understanding and agreement to the plan.

## 2016-06-20 NOTE — ED Notes (Signed)
General surgeon at bedside.  

## 2016-06-20 NOTE — ED Triage Notes (Signed)
Pt c/o lower abdominal pain x1 week with progressive worsening. Pt went to PCP and had lab work done which showed elevated white count. Denies n/v. RLQ is tender to palpation.

## 2016-06-20 NOTE — ED Notes (Addendum)
Pt reports lower abdominal pain of 8/10. Pt reports pain started about 10 days, with a fever starting 06/19/16. Pt reports taking a 'intestinal cleanser' a couple days ago given by spouse because thought he was just backed up. Reports had about 7 bowel movements today.

## 2016-06-20 NOTE — Anesthesia Preprocedure Evaluation (Addendum)
Anesthesia Evaluation  Patient identified by MRN, date of birth, ID band Patient awake    Reviewed: Allergy & Precautions, NPO status , Patient's Chart, lab work & pertinent test results  Airway Mallampati: I  TM Distance: >3 FB Neck ROM: Full    Dental  (+) Teeth Intact, Dental Advisory Given, Poor Dentition   Pulmonary    breath sounds clear to auscultation       Cardiovascular hypertension, Pt. on medications + CAD   Rhythm:Regular Rate:Normal     Neuro/Psych PSYCHIATRIC DISORDERS Anxiety Depression  Neuromuscular disease    GI/Hepatic negative GI ROS, Neg liver ROS,   Endo/Other  diabetes, Type 2, Oral Hypoglycemic Agents, Insulin Dependent  Renal/GU negative Renal ROS  negative genitourinary   Musculoskeletal  (+) Arthritis , Osteoarthritis,    Abdominal   Peds negative pediatric ROS (+)  Hematology negative hematology ROS (+)   Anesthesia Other Findings   Reproductive/Obstetrics negative OB ROS                            Lab Results  Component Value Date   WBC 16.2 (H) 06/20/2016   HGB 12.6 (L) 06/20/2016   HCT 38.4 (L) 06/20/2016   MCV 78.0 06/20/2016   PLT 409 (H) 06/20/2016   Lab Results  Component Value Date   INR 0.95 12/20/2013   INR 0.97 09/21/2013   INR 0.93 10/18/2012   Lab Results  Component Value Date   CREATININE 0.97 06/20/2016   BUN 6 06/20/2016   NA 134 (L) 06/20/2016   K 4.4 06/20/2016   CL 100 (L) 06/20/2016   CO2 25 06/20/2016   06/2016 EKG: normal sinus rhythm.  Anesthesia Physical Anesthesia Plan  ASA: II and emergent  Anesthesia Plan: General   Post-op Pain Management:    Induction: Intravenous, Rapid sequence and Cricoid pressure planned  Airway Management Planned: Oral ETT  Additional Equipment:   Intra-op Plan:   Post-operative Plan: Extubation in OR  Informed Consent: I have reviewed the patients History and Physical, chart,  labs and discussed the procedure including the risks, benefits and alternatives for the proposed anesthesia with the patient or authorized representative who has indicated his/her understanding and acceptance.   Dental advisory given  Plan Discussed with: CRNA  Anesthesia Plan Comments:         Anesthesia Quick Evaluation

## 2016-06-20 NOTE — ED Notes (Signed)
Pt transported to short stay 

## 2016-06-20 NOTE — ED Notes (Signed)
CT called, pt is next on the list 

## 2016-06-21 DIAGNOSIS — Q438 Other specified congenital malformations of intestine: Secondary | ICD-10-CM | POA: Diagnosis not present

## 2016-06-21 DIAGNOSIS — I2510 Atherosclerotic heart disease of native coronary artery without angina pectoris: Secondary | ICD-10-CM | POA: Diagnosis present

## 2016-06-21 DIAGNOSIS — Z96652 Presence of left artificial knee joint: Secondary | ICD-10-CM | POA: Diagnosis present

## 2016-06-21 DIAGNOSIS — Z6833 Body mass index (BMI) 33.0-33.9, adult: Secondary | ICD-10-CM | POA: Diagnosis not present

## 2016-06-21 DIAGNOSIS — F329 Major depressive disorder, single episode, unspecified: Secondary | ICD-10-CM | POA: Diagnosis present

## 2016-06-21 DIAGNOSIS — K353 Acute appendicitis with localized peritonitis: Secondary | ICD-10-CM | POA: Diagnosis present

## 2016-06-21 DIAGNOSIS — E785 Hyperlipidemia, unspecified: Secondary | ICD-10-CM | POA: Diagnosis present

## 2016-06-21 DIAGNOSIS — E1140 Type 2 diabetes mellitus with diabetic neuropathy, unspecified: Secondary | ICD-10-CM | POA: Diagnosis present

## 2016-06-21 DIAGNOSIS — K37 Unspecified appendicitis: Secondary | ICD-10-CM | POA: Diagnosis not present

## 2016-06-21 DIAGNOSIS — K3532 Acute appendicitis with perforation and localized peritonitis, without abscess: Secondary | ICD-10-CM | POA: Diagnosis present

## 2016-06-21 DIAGNOSIS — L409 Psoriasis, unspecified: Secondary | ICD-10-CM | POA: Diagnosis present

## 2016-06-21 DIAGNOSIS — E1165 Type 2 diabetes mellitus with hyperglycemia: Secondary | ICD-10-CM | POA: Diagnosis present

## 2016-06-21 DIAGNOSIS — Z5331 Laparoscopic surgical procedure converted to open procedure: Secondary | ICD-10-CM | POA: Diagnosis not present

## 2016-06-21 DIAGNOSIS — F909 Attention-deficit hyperactivity disorder, unspecified type: Secondary | ICD-10-CM | POA: Diagnosis present

## 2016-06-21 DIAGNOSIS — R1033 Periumbilical pain: Secondary | ICD-10-CM | POA: Diagnosis present

## 2016-06-21 DIAGNOSIS — Z794 Long term (current) use of insulin: Secondary | ICD-10-CM | POA: Diagnosis not present

## 2016-06-21 DIAGNOSIS — K352 Acute appendicitis with generalized peritonitis: Secondary | ICD-10-CM | POA: Diagnosis not present

## 2016-06-21 DIAGNOSIS — G4733 Obstructive sleep apnea (adult) (pediatric): Secondary | ICD-10-CM | POA: Diagnosis present

## 2016-06-21 DIAGNOSIS — I10 Essential (primary) hypertension: Secondary | ICD-10-CM | POA: Diagnosis present

## 2016-06-21 DIAGNOSIS — F419 Anxiety disorder, unspecified: Secondary | ICD-10-CM | POA: Diagnosis present

## 2016-06-21 LAB — GLUCOSE, CAPILLARY
GLUCOSE-CAPILLARY: 132 mg/dL — AB (ref 65–99)
GLUCOSE-CAPILLARY: 138 mg/dL — AB (ref 65–99)
GLUCOSE-CAPILLARY: 273 mg/dL — AB (ref 65–99)
Glucose-Capillary: 124 mg/dL — ABNORMAL HIGH (ref 65–99)
Glucose-Capillary: 198 mg/dL — ABNORMAL HIGH (ref 65–99)
Glucose-Capillary: 301 mg/dL — ABNORMAL HIGH (ref 65–99)
Glucose-Capillary: 327 mg/dL — ABNORMAL HIGH (ref 65–99)

## 2016-06-21 MED ORDER — INSULIN ASPART 100 UNIT/ML ~~LOC~~ SOLN
4.0000 [IU] | Freq: Once | SUBCUTANEOUS | Status: AC
  Administered 2016-06-21: 4 [IU] via INTRAVENOUS

## 2016-06-21 MED ORDER — GABAPENTIN 300 MG PO CAPS
600.0000 mg | ORAL_CAPSULE | Freq: Two times a day (BID) | ORAL | Status: DC
  Administered 2016-06-21 – 2016-06-26 (×10): 600 mg via ORAL
  Filled 2016-06-21 (×8): qty 2

## 2016-06-21 MED ORDER — GABAPENTIN 300 MG PO CAPS
300.0000 mg | ORAL_CAPSULE | Freq: Every day | ORAL | Status: DC
  Administered 2016-06-21 – 2016-06-26 (×6): 300 mg via ORAL
  Filled 2016-06-21 (×9): qty 1

## 2016-06-21 MED ORDER — PIPERACILLIN-TAZOBACTAM 3.375 G IVPB
3.3750 g | Freq: Three times a day (TID) | INTRAVENOUS | Status: DC
  Administered 2016-06-21 – 2016-06-26 (×17): 3.375 g via INTRAVENOUS
  Filled 2016-06-21 (×20): qty 50

## 2016-06-21 MED ORDER — INSULIN ASPART 100 UNIT/ML ~~LOC~~ SOLN
SUBCUTANEOUS | Status: AC
  Filled 2016-06-21: qty 4

## 2016-06-21 MED ORDER — ACETAMINOPHEN 500 MG PO TABS
1000.0000 mg | ORAL_TABLET | Freq: Four times a day (QID) | ORAL | Status: DC
  Administered 2016-06-21 – 2016-06-26 (×19): 1000 mg via ORAL
  Filled 2016-06-21 (×19): qty 2

## 2016-06-21 MED ORDER — ENOXAPARIN SODIUM 40 MG/0.4ML ~~LOC~~ SOLN
40.0000 mg | SUBCUTANEOUS | Status: DC
  Administered 2016-06-22 – 2016-06-26 (×5): 40 mg via SUBCUTANEOUS
  Filled 2016-06-21 (×5): qty 0.4

## 2016-06-21 MED ORDER — ACETAMINOPHEN 10 MG/ML IV SOLN
1000.0000 mg | Freq: Once | INTRAVENOUS | Status: AC
  Administered 2016-06-21: 1000 mg via INTRAVENOUS

## 2016-06-21 MED ORDER — POTASSIUM CHLORIDE IN NACL 20-0.45 MEQ/L-% IV SOLN
INTRAVENOUS | Status: DC
  Administered 2016-06-21 – 2016-06-26 (×11): via INTRAVENOUS
  Filled 2016-06-21 (×13): qty 1000

## 2016-06-21 MED ORDER — ONDANSETRON 4 MG PO TBDP: 4.0000 mg | ORAL_TABLET | Freq: Four times a day (QID) | ORAL | Status: DC | PRN

## 2016-06-21 MED ORDER — FAMOTIDINE 20 MG PO TABS
20.0000 mg | ORAL_TABLET | Freq: Two times a day (BID) | ORAL | Status: DC
  Administered 2016-06-21 – 2016-06-26 (×10): 20 mg via ORAL
  Filled 2016-06-21 (×10): qty 1

## 2016-06-21 MED ORDER — LISINOPRIL 5 MG PO TABS
5.0000 mg | ORAL_TABLET | Freq: Every day | ORAL | Status: DC
  Administered 2016-06-21 – 2016-06-26 (×6): 5 mg via ORAL
  Filled 2016-06-21 (×6): qty 1

## 2016-06-21 MED ORDER — HYDROMORPHONE 1 MG/ML IV SOLN
INTRAVENOUS | Status: DC
  Administered 2016-06-21: 3.9 mg via INTRAVENOUS
  Administered 2016-06-21: 12:00:00 via INTRAVENOUS
  Administered 2016-06-21: 2.1 mg via INTRAVENOUS
  Administered 2016-06-22: 2.7 mg via INTRAVENOUS
  Administered 2016-06-22: 3.6 mg via INTRAVENOUS
  Administered 2016-06-22: 11:00:00 via INTRAVENOUS
  Administered 2016-06-22: 3.3 mg via INTRAVENOUS
  Administered 2016-06-22: 18 mg via INTRAVENOUS
  Administered 2016-06-22: 1.8 mg via INTRAVENOUS
  Administered 2016-06-22: 11 mg via INTRAVENOUS
  Administered 2016-06-22: 5 mg via INTRAVENOUS
  Administered 2016-06-23: 1.5 mg via INTRAVENOUS
  Administered 2016-06-23: 1.2 mg via INTRAVENOUS
  Administered 2016-06-23: 2.1 mg via INTRAVENOUS
  Administered 2016-06-23: 3 mg via INTRAVENOUS
  Administered 2016-06-23: 1.5 mg via INTRAVENOUS
  Administered 2016-06-23: 2.1 mg via INTRAVENOUS
  Administered 2016-06-24: 4.3 mg via INTRAVENOUS
  Administered 2016-06-24: 0.9 mg via INTRAVENOUS
  Administered 2016-06-24: 1.8 mg via INTRAVENOUS
  Administered 2016-06-24: 2.4 mg via INTRAVENOUS
  Administered 2016-06-24: 1.5 mg via INTRAVENOUS
  Administered 2016-06-24: 11:00:00 via INTRAVENOUS
  Administered 2016-06-25: 2.4 mg via INTRAVENOUS
  Administered 2016-06-25: 0.6 mg via INTRAVENOUS
  Filled 2016-06-21 (×3): qty 25

## 2016-06-21 MED ORDER — ONDANSETRON HCL 4 MG/2ML IJ SOLN: 4.0000 mg | Freq: Four times a day (QID) | INTRAMUSCULAR | Status: DC | PRN

## 2016-06-21 MED ORDER — SODIUM CHLORIDE 0.9% FLUSH: 9.0000 mL | INTRAVENOUS | Status: DC | PRN

## 2016-06-21 MED ORDER — DIPHENHYDRAMINE HCL 50 MG/ML IJ SOLN
12.5000 mg | Freq: Four times a day (QID) | INTRAMUSCULAR | Status: DC | PRN
  Administered 2016-06-22: 12.5 mg via INTRAVENOUS

## 2016-06-21 MED ORDER — NALOXONE HCL 0.4 MG/ML IJ SOLN: 0.4000 mg | INTRAMUSCULAR | Status: DC | PRN

## 2016-06-21 MED ORDER — INSULIN ASPART 100 UNIT/ML ~~LOC~~ SOLN
0.0000 [IU] | SUBCUTANEOUS | Status: DC
  Administered 2016-06-21: 11 [IU] via SUBCUTANEOUS
  Administered 2016-06-21: 4 [IU] via SUBCUTANEOUS
  Administered 2016-06-21: 15 [IU] via SUBCUTANEOUS
  Administered 2016-06-21 – 2016-06-22 (×4): 3 [IU] via SUBCUTANEOUS
  Administered 2016-06-22: 4 [IU] via SUBCUTANEOUS
  Administered 2016-06-22: 3 [IU] via SUBCUTANEOUS
  Administered 2016-06-22: 4 [IU] via SUBCUTANEOUS
  Administered 2016-06-22 – 2016-06-23 (×3): 7 [IU] via SUBCUTANEOUS
  Administered 2016-06-23: 11 [IU] via SUBCUTANEOUS
  Administered 2016-06-23 (×2): 4 [IU] via SUBCUTANEOUS
  Administered 2016-06-23: 7 [IU] via SUBCUTANEOUS
  Administered 2016-06-24: 15 [IU] via SUBCUTANEOUS
  Administered 2016-06-24 (×3): 7 [IU] via SUBCUTANEOUS
  Administered 2016-06-24: 4 [IU] via SUBCUTANEOUS
  Administered 2016-06-24: 11 [IU] via SUBCUTANEOUS
  Administered 2016-06-25: 4 [IU] via SUBCUTANEOUS
  Administered 2016-06-25: 7 [IU] via SUBCUTANEOUS
  Administered 2016-06-25: 11 [IU] via SUBCUTANEOUS
  Administered 2016-06-25 (×2): 7 [IU] via SUBCUTANEOUS
  Administered 2016-06-26 (×2): 4 [IU] via SUBCUTANEOUS

## 2016-06-21 MED ORDER — KETOROLAC TROMETHAMINE 30 MG/ML IJ SOLN
30.0000 mg | Freq: Once | INTRAMUSCULAR | Status: AC
  Administered 2016-06-21: 30 mg via INTRAVENOUS

## 2016-06-21 MED ORDER — PHENOL 1.4 % MT LIQD: 1.0000 | OROMUCOSAL | Status: DC | PRN

## 2016-06-21 MED ORDER — ACETAMINOPHEN 10 MG/ML IV SOLN
INTRAVENOUS | Status: AC
  Filled 2016-06-21: qty 100

## 2016-06-21 MED ORDER — MORPHINE SULFATE (PF) 2 MG/ML IV SOLN
1.0000 mg | INTRAVENOUS | Status: DC | PRN
  Administered 2016-06-21 (×4): 4 mg via INTRAVENOUS
  Filled 2016-06-21: qty 2
  Filled 2016-06-21: qty 1
  Filled 2016-06-21 (×4): qty 2

## 2016-06-21 MED ORDER — FAMOTIDINE IN NACL 20-0.9 MG/50ML-% IV SOLN
20.0000 mg | Freq: Two times a day (BID) | INTRAVENOUS | Status: DC
  Administered 2016-06-21 (×2): 20 mg via INTRAVENOUS
  Filled 2016-06-21 (×3): qty 50

## 2016-06-21 MED ORDER — DIPHENHYDRAMINE HCL 50 MG/ML IJ SOLN
12.5000 mg | Freq: Four times a day (QID) | INTRAMUSCULAR | Status: DC | PRN
Start: 2016-06-21 — End: 2016-06-26
  Filled 2016-06-21: qty 1

## 2016-06-21 MED ORDER — DIPHENHYDRAMINE HCL 12.5 MG/5ML PO ELIX: 12.5000 mg | ORAL_SOLUTION | Freq: Four times a day (QID) | ORAL | Status: DC | PRN

## 2016-06-21 MED ORDER — KETOROLAC TROMETHAMINE 30 MG/ML IJ SOLN
INTRAMUSCULAR | Status: AC
  Administered 2016-06-21: 30 mg via INTRAVENOUS
  Filled 2016-06-21: qty 1

## 2016-06-21 NOTE — Progress Notes (Signed)
Called for pump earlier.  Portable said none available right now and is looking for one.

## 2016-06-21 NOTE — Brief Op Note (Signed)
06/20/2016 - 06/21/2016  12:34 AM  PATIENT:  Steve [name]  55 y.o. male  PRE-OPERATIVE DIAGNOSIS:  acute appendicitis  POST-OPERATIVE DIAGNOSIS:  Ruptured acute appendicitis  PROCEDURE:  Procedure(s): LAPAROSCOPIC CONVERTED TO OPEN APPENDECTOMY (N/A) (*22 modifier - complexity of case - 3hrs to perform)  SURGEON:  Surgeon(s) and Role:    * Greer Pickerel, MD - Primary  PHYSICIAN ASSISTANT:   ASSISTANTS: none   ANESTHESIA:   general  EBL:  Total I/O In: 2700 [I.V.:2700] Out: 500 [Urine:300; Blood:200]  BLOOD ADMINISTERED:none  DRAINS: (19) Jackson-Pratt drain(s) with closed bulb suction in the RLQ and Urinary Catheter (Foley)   LOCAL MEDICATIONS USED:  MARCAINE     SPECIMEN:  Source of Specimen:  appendix with periappendiceal fat  DISPOSITION OF SPECIMEN:  PATHOLOGY  COUNTS:  YES  TOURNIQUET:  * No tourniquets in log *  DICTATION: .Other Dictation: Dictation Number [id number]  PLAN OF CARE: Admit to inpatient   PATIENT DISPOSITION:  PACU - hemodynamically stable.   Delay start of Pharmacological VTE agent (>24hrs) due to surgical blood loss or risk of bleeding: yes  Leighton Ruff. Redmond Pulling, MD, FACS General, Bariatric, & Minimally Invasive Surgery [facility], [place]

## 2016-06-21 NOTE — Progress Notes (Signed)
1 Day Post-Op  Subjective: Having some pain this AM No n/v  Objective: Vital signs in last 24 hours: Temp:  [97.7 F (36.5 C)-99.8 F (37.7 C)] 98.2 F (36.8 C) (09/09 0610) Pulse Rate:  [81-122] 81 (09/09 0610) Resp:  [11-20] 18 (09/09 0610) BP: (125-202)/(66-98) 140/72 (09/09 0610) SpO2:  [95 %-100 %] 100 % (09/09 0610) Weight:  [123.4 kg (272 lb 0.8 oz)] 123.4 kg (272 lb 0.8 oz) (09/09 0126)    Intake/Output from previous day: 09/08 0701 - 09/09 0700 In: 3525 [I.V.:3325; IV Piggyback:200] Out: 1510 [Urine:1150; Drains:160; Blood:200] Intake/Output this shift: No intake/output data recorded.  General appearance: alert and cooperative GI: soft, approp ttp, ND, JP SS, dressign c/d/i  Lab Results:   Recent Labs  06/19/16 1601 06/20/16 1422  WBC 15.7* 16.2*  HGB 13.0 12.6*  HCT 39.6 38.4*  PLT 346.0 409*   BMET  Recent Labs  06/19/16 1601 06/20/16 1422  NA 134* 134*  K 4.0 4.4  CL 96 100*  CO2 29 25  GLUCOSE 147* 266*  BUN 8 6  CREATININE 0.88 0.97  CALCIUM 8.8 8.9   PT/INR No results for input(s): LABPROT, INR in the last 72 hours. ABG No results for input(s): PHART, HCO3 in the last 72 hours.  Invalid input(s): PCO2, PO2  Studies/Results: Ct Abdomen Pelvis W Contrast  Result Date: 06/20/2016 CLINICAL DATA:  Lower abdominal pain for 2 weeks EXAM: CT ABDOMEN AND PELVIS WITH CONTRAST TECHNIQUE: Multidetector CT imaging of the abdomen and pelvis was performed using the standard protocol following bolus administration of intravenous contrast. CONTRAST:  160mL ISOVUE-300 IOPAMIDOL (ISOVUE-300) INJECTION 61% COMPARISON:  03/13/2016 FINDINGS: Lower chest: No acute abnormality. Hepatobiliary: No focal liver abnormality is seen. No gallstones, gallbladder wall thickening, or biliary dilatation.Mild hepatic steatosis. Pancreas: Unremarkable. No pancreatic ductal dilatation or surrounding inflammatory changes. Spleen: Normal in size without focal abnormality.  Adrenals/Urinary Tract: Adrenal glands are unremarkable. Kidneys are normal, without renal calculi, focal lesion, or hydronephrosis. Bladder is unremarkable. Stomach/Bowel: Stomach is within normal limits. Marked right lower quadrant inflammatory changes are identified. The appendix appears markedly thickened with surrounding fat stranding. There is mild wall thickening involving the terminal ileum, image 70 of series 201. No discrete abscess identified. Mild wall thickening involving the sigmoid colon is identified as it courses through the right lower quadrant of the abdomen. Vascular/Lymphatic: Normal appearance of the abdominal aorta. No enlarged retroperitoneal or mesenteric adenopathy. No enlarged pelvic or inguinal lymph nodes. Reproductive: Prostate is unremarkable. Other: No abdominal wall hernia or abnormality. No abdominopelvic ascites. Musculoskeletal: No acute or significant osseous findings. IMPRESSION: 1. Examination is positive for marked right lower quadrant inflammatory change. This is favored to represent sequelae of acute appendicitis. Alternatively findings may reflect an infectious or inflammatory enteritis with secondary inflammation of the appendix. 2. No pathologic dilatation of the large or small bowel loops to suggest obstruction. No drainable abscess identified. Electronically Signed   By: Kerby Moors M.D.   On: 06/20/2016 17:52    Anti-infectives: Anti-infectives    Start     Dose/Rate Route Frequency Ordered Stop   06/21/16 0300  piperacillin-tazobactam (ZOSYN) IVPB 3.375 g     3.375 g 12.5 mL/hr over 240 Minutes Intravenous Every 8 hours 06/21/16 0055     06/20/16 1930  piperacillin-tazobactam (ZOSYN) IVPB 3.375 g  Status:  Discontinued     3.375 g 12.5 mL/hr over 240 Minutes Intravenous Every 8 hours 06/20/16 1901 06/21/16 0044   06/20/16 1845  Ampicillin-Sulbactam (UNASYN)  3 g in sodium chloride 0.9 % 100 mL IVPB  Status:  Discontinued     3 g 100 mL/hr over 60  Minutes Intravenous Every 8 hours 06/20/16 1826 06/20/16 1900      Assessment/Plan: s/p Procedure(s): LAPAROSCOPIC CONVERTED TO OPEN APPENDECTOMY (N/A) Add PCA Mobilize to chair Await bowel function, go slow with PO  LOS: 0 days    Rosario Jacks., Anne Hahn 06/21/2016

## 2016-06-21 NOTE — Op Note (Signed)
Steve [name], Steve [name]                ACCOUNT NO.:  [id number]  MEDICAL RECORD NO.:  [id number]  LOCATION:  6N29C                        FACILITY:  [facility]  PHYSICIAN:  Leighton Ruff. Redmond Pulling, MD, FACSDATE OF BIRTH:  05-Sep-1961  DATE OF PROCEDURE:  06/21/2016 DATE OF DISCHARGE:                              OPERATIVE REPORT   PREOPERATIVE DIAGNOSIS:  Acute appendicitis.  POSTOPERATIVE DIAGNOSIS:  Ruptured acute appendicitis.  PROCEDURE:  Laparoscopic converted open appendectomy.  (A 22 modifier should be applied due to the complexity of this appendectomy given that it took 3 hours to identify the appendix and successfully remove it.)  SURGEON:  Leighton Ruff. Redmond Pulling, MD, FACS.  ASSISTANT SURGEON:  None.  ANESTHESIA:  General.  ESTIMATED BLOOD LOSS:  150 mL.  SPECIMEN:  Appendix in pieces.  INDICATIONS FOR SURGERY:  The patient is a 55 year old gentleman with hypertension, diabetes mellitus, obstructive sleep apnea, who developed generalized abdominal pain last Wednesday.  It became persistent since this past Friday.  He finally went to Urgent Care on Thursday late in the afternoon, was found to have an elevated white blood cell count and was told to come to the emergency room today for CT imaging.  CT demonstrated dense inflammatory changes in the right lower quadrant and terminal ileum and proximal cecum, consistent with severe appendicitis. There was no evidence of abscess.  He had localized peritonitis on exam. He appeared quite ill and uncomfortable.  I felt that antibiotic management alone would not be sufficient for management of his severe appendicitis.  We discussed appendectomy versus antibiotic management. We discussed the pros and cons of each.  He elected to proceed to surgery.  We discussed at length.  The high likelihood of possible conversion to open given the dense inflammation in the right lower quadrant.  We discussed risk of bleeding, infection, ileus, potential need for  bowel resection, injury to surrounding structures such as enterotomies, blood clot formation, incisional hernia, wound infection, perioperative cardiac and pulmonary events, need for additional procedures.  He elected to proceed to surgery.  DESCRIPTION OF PROCEDURE:  He was taken urgently to the operating room 2 at [facility], placed supine on the operating room table. Sequential compression devices were placed.  General endotracheal anesthesia was established.  A Foley catheter was placed.  His abdomen was prepped and draped in the usual standard surgical fashion with ChloraPrep.  He received Zosyn in the emergency room.  A surgical time- out was performed.  He had an old transverse infraumbilical incision. He reported a very remote history of an umbilical hernia repair.  He was not sure if mesh had been used.  A vertical infraumbilical incision was made with a #11 blade.  The fascia was grasped and lifted anteriorly and it was incised with a 15 blade.  The abdominal cavity was entered and a pursestring suture was placed around the fascial edges with 0 Vicryl.  A 12 mm Hasson trocar was placed and pneumoperitoneum was smoothly established up to a patient pressure of 15 mmHg.  There was no evidence of injury to surrounding structures.  The patient was placed in Trendelenburg and rotated to the left.  I then placed two  5 mm trocars, 1 in the suprapubic location, 1 in the left lower quadrant under direct visualization.  There was obvious dense inflammatory process in the right lower quadrant.  There was no gross pus at this point in the abdomen.  The terminal ileum was visualized, it became hyperemic as it entered the cecum and the cecum was visualized.  The typical epiploic appendage between the cecum and the terminal ileum was quite thickened and hypertrophied and indurated.  It appeared that there was sigmoid colon adhered near the base of the cecum and what appeared to be a  dense inflammatory mass in that location as well.  This was also adhered to the undersurface of the TI mesentery.  I was able to bluntly dissect off the sigmoid colon epiploic appendages.  There are specifically 3 large pedunculated epiploic appendages, coming off the sigmoid colon adhered to what appeared to be a contained perforation of the tip of the appendix.  This gave me some mobility of the dense inflammatory mass in the right lower quadrant.  I was able to mobilize more of the terminal ileum off this area.  I continued to work tediously for a total of an hour and a half and I was able to free up the majority of the terminal ileum and the lateral cecum; however, I was not able to make any additional progress.  It was unclear exactly where the base of the appendix was.  It was unclear where the cecum fused into the terminal ileum due to the dense inflammation.  Because I had not made any additional gains after an hour and a half, I decided to convert to an open procedure.  The Hasson trocar was removed.  A mini midline incision was made extending up above around the umbilicus and a little bit further below the Hasson trocar.  Fascia was divided and the abdominal cavity was entered.  A Balfour retractor was placed.  I was able to gently and bluntly lift up the cecum off the retroperitoneum with my left hand.  This delivered the cecum and this dense inflammatory mass up into the wound.  Using a right angle, I was able to circumferentially dissect off what appeared to be the mesoappendix.  There had been some purulent drainage from this area laparoscopically when I was grasping this dense inflammatory mass.  I was able to identify an intact appendiceal base going into the cecum.  I used laparoscopic 45 mm stapler with a blue load and divided the base of the appendix off the cecum.  The staple line was intact.  I was then able to bluntly free the bulbous, indurated, contained  perforation of the appendix off the back wall of the TI mesentery.  The terminal ileum where it went into the cecum was indurated, however, the bowel wall was intact.  The abdomen was irrigated with 3 L of saline.  I did elect to leave a drain.  It was brought out through the left to the right lower quadrant and secured to the skin with a 2-0 nylon suture.  I placed a 19 round drain.  It was placed in the right lower quadrant near the staple line and extending into the pelvis.  I then reapproximated the fascia with a #1 looped PDS, 1 from above and 1 from below.  I then re-established pneumoperitoneum and visualized my closure.  There was nothing trapped within it.  There was no air leak.  Pneumoperitoneum was released.  The two 5 mm  trocars had been removed and those 2 skin trocar sites were closed with skin staples.  The mini-midline incision was left open and packed with moist gauze followed by ABDs and tape.  The patient was extubated and taken to the recovery room in stable condition.  All needle, instrument, sponge counts were correct x2.       Leighton Ruff. Redmond Pulling, MD, FACS     EMW/MEDQ  D:  06/21/2016  T:  06/21/2016  Job:  [id number]

## 2016-06-21 NOTE — Transfer of Care (Signed)
Immediate Anesthesia Transfer of Care Note  Patient: Steve [name]  Procedure(s) Performed: Procedure(s): LAPAROSCOPIC CONVERTED TO OPEN APPENDECTOMY (N/A)  Patient Location: PACU  Anesthesia Type:General  Level of Consciousness: awake, alert  and oriented  Airway & Oxygen Therapy: Patient Spontanous Breathing and Patient connected to nasal cannula oxygen  Post-op Assessment: Report given to RN and Post -op Vital signs reviewed and stable  Post vital signs: Reviewed and stable  Last Vitals:  Vitals:   06/20/16 1915 06/20/16 2356  BP: 126/66 (!) (P) 158/98  Pulse: 97 (!) (P) 101  Resp: 14 (P) 16  Temp:  (P) 36.5 C    Last Pain:  Vitals:   06/20/16 1924  TempSrc:   PainSc: 7          Complications: No apparent anesthesia complications

## 2016-06-22 LAB — CBC
HCT: 34.7 % — ABNORMAL LOW (ref 39.0–52.0)
HEMOGLOBIN: 10.6 g/dL — AB (ref 13.0–17.0)
MCH: 24.3 pg — ABNORMAL LOW (ref 26.0–34.0)
MCHC: 30.5 g/dL (ref 30.0–36.0)
MCV: 79.6 fL (ref 78.0–100.0)
PLATELETS: 412 10*3/uL — AB (ref 150–400)
RBC: 4.36 MIL/uL (ref 4.22–5.81)
RDW: 14.7 % (ref 11.5–15.5)
WBC: 10.2 10*3/uL (ref 4.0–10.5)

## 2016-06-22 LAB — BASIC METABOLIC PANEL
ANION GAP: 7 (ref 5–15)
BUN: 6 mg/dL (ref 6–20)
CO2: 26 mmol/L (ref 22–32)
Calcium: 8.2 mg/dL — ABNORMAL LOW (ref 8.9–10.3)
Chloride: 102 mmol/L (ref 101–111)
Creatinine, Ser: 0.85 mg/dL (ref 0.61–1.24)
Glucose, Bld: 143 mg/dL — ABNORMAL HIGH (ref 65–99)
POTASSIUM: 4.4 mmol/L (ref 3.5–5.1)
SODIUM: 135 mmol/L (ref 135–145)

## 2016-06-22 LAB — URINE CULTURE

## 2016-06-22 LAB — GLUCOSE, CAPILLARY
GLUCOSE-CAPILLARY: 142 mg/dL — AB (ref 65–99)
GLUCOSE-CAPILLARY: 149 mg/dL — AB (ref 65–99)
GLUCOSE-CAPILLARY: 193 mg/dL — AB (ref 65–99)
GLUCOSE-CAPILLARY: 202 mg/dL — AB (ref 65–99)
GLUCOSE-CAPILLARY: 216 mg/dL — AB (ref 65–99)
Glucose-Capillary: 165 mg/dL — ABNORMAL HIGH (ref 65–99)

## 2016-06-22 MED ORDER — NYSTATIN 100000 UNIT/ML MT SUSP
5.0000 mL | Freq: Four times a day (QID) | OROMUCOSAL | Status: DC
  Administered 2016-06-22 – 2016-06-26 (×16): 500000 [IU] via ORAL
  Filled 2016-06-22 (×15): qty 5

## 2016-06-22 NOTE — Progress Notes (Signed)
   06/22/16 1400  HEENT  HEENT (WDL) X  Mucous Membrane(s) White patches (Dr. Redmond Pulling called orders recieved)

## 2016-06-22 NOTE — Progress Notes (Signed)
2 Days Post-Op  Subjective: Pt with better pain control No flatus   Objective: Vital signs in last 24 hours: Temp:  [98.2 F (36.8 C)-99.1 F (37.3 C)] 98.3 F (36.8 C) (09/10 0402) Pulse Rate:  [82-94] 82 (09/10 0402) Resp:  [8-20] 9 (09/10 0723) BP: (128-140)/(68-75) 128/68 (09/10 0402) SpO2:  [94 %-99 %] 94 % (09/10 0723) Last BM Date:  (PTA)  Intake/Output from previous day: 09/09 0701 - 09/10 0700 In: 2282.9 [P.O.:10; I.V.:2072.9; IV Piggyback:200] Out: 1650 [Urine:1500; Drains:150] Intake/Output this shift: No intake/output data recorded.  General appearance: alert and cooperative GI: soft, approp ttp, JP SS, incision c/d/i  Lab Results:   Recent Labs  06/20/16 1422 06/22/16 0533  WBC 16.2* 10.2  HGB 12.6* 10.6*  HCT 38.4* 34.7*  PLT 409* 412*   BMET  Recent Labs  06/20/16 1422 06/22/16 0533  NA 134* 135  K 4.4 4.4  CL 100* 102  CO2 25 26  GLUCOSE 266* 143*  BUN 6 6  CREATININE 0.97 0.85  CALCIUM 8.9 8.2*   PT/INR No results for input(s): LABPROT, INR in the last 72 hours. ABG No results for input(s): PHART, HCO3 in the last 72 hours.  Invalid input(s): PCO2, PO2  Studies/Results: Ct Abdomen Pelvis W Contrast  Result Date: 06/20/2016 CLINICAL DATA:  Lower abdominal pain for 2 weeks EXAM: CT ABDOMEN AND PELVIS WITH CONTRAST TECHNIQUE: Multidetector CT imaging of the abdomen and pelvis was performed using the standard protocol following bolus administration of intravenous contrast. CONTRAST:  125mL ISOVUE-300 IOPAMIDOL (ISOVUE-300) INJECTION 61% COMPARISON:  03/13/2016 FINDINGS: Lower chest: No acute abnormality. Hepatobiliary: No focal liver abnormality is seen. No gallstones, gallbladder wall thickening, or biliary dilatation.Mild hepatic steatosis. Pancreas: Unremarkable. No pancreatic ductal dilatation or surrounding inflammatory changes. Spleen: Normal in size without focal abnormality. Adrenals/Urinary Tract: Adrenal glands are unremarkable.  Kidneys are normal, without renal calculi, focal lesion, or hydronephrosis. Bladder is unremarkable. Stomach/Bowel: Stomach is within normal limits. Marked right lower quadrant inflammatory changes are identified. The appendix appears markedly thickened with surrounding fat stranding. There is mild wall thickening involving the terminal ileum, image 70 of series 201. No discrete abscess identified. Mild wall thickening involving the sigmoid colon is identified as it courses through the right lower quadrant of the abdomen. Vascular/Lymphatic: Normal appearance of the abdominal aorta. No enlarged retroperitoneal or mesenteric adenopathy. No enlarged pelvic or inguinal lymph nodes. Reproductive: Prostate is unremarkable. Other: No abdominal wall hernia or abnormality. No abdominopelvic ascites. Musculoskeletal: No acute or significant osseous findings. IMPRESSION: 1. Examination is positive for marked right lower quadrant inflammatory change. This is favored to represent sequelae of acute appendicitis. Alternatively findings may reflect an infectious or inflammatory enteritis with secondary inflammation of the appendix. 2. No pathologic dilatation of the large or small bowel loops to suggest obstruction. No drainable abscess identified. Electronically Signed   By: Kerby Moors M.D.   On: 06/20/2016 17:52    Anti-infectives: Anti-infectives    Start     Dose/Rate Route Frequency Ordered Stop   06/21/16 0300  piperacillin-tazobactam (ZOSYN) IVPB 3.375 g     3.375 g 12.5 mL/hr over 240 Minutes Intravenous Every 8 hours 06/21/16 0055     06/20/16 1930  piperacillin-tazobactam (ZOSYN) IVPB 3.375 g  Status:  Discontinued     3.375 g 12.5 mL/hr over 240 Minutes Intravenous Every 8 hours 06/20/16 1901 06/21/16 0044   06/20/16 1845  Ampicillin-Sulbactam (UNASYN) 3 g in sodium chloride 0.9 % 100 mL IVPB  Status:  Discontinued     3 g 100 mL/hr over 60 Minutes Intravenous Every 8 hours 06/20/16 1826 06/20/16 1900       Assessment/Plan: s/p Procedure(s): LAPAROSCOPIC CONVERTED TO OPEN APPENDECTOMY (N/A) d/c foley  Mobilize con't abx Await bowel function  LOS: 1 day    Rosario Jacks., Bladyn Tipps 06/22/2016

## 2016-06-22 NOTE — Anesthesia Postprocedure Evaluation (Signed)
Anesthesia Post Note  Patient: Steve [name]  Procedure(s) Performed: Procedure(s) (LRB): LAPAROSCOPIC CONVERTED TO OPEN APPENDECTOMY (N/A)  Patient location during evaluation: PACU Anesthesia Type: General Level of consciousness: awake and alert Pain management: pain level controlled Vital Signs Assessment: post-procedure vital signs reviewed and stable Respiratory status: spontaneous breathing, nonlabored ventilation, respiratory function stable and patient connected to nasal cannula oxygen Cardiovascular status: blood pressure returned to baseline and stable Postop Assessment: no signs of nausea or vomiting Anesthetic complications: no    Last Vitals:  Vitals:   06/22/16 0402 06/22/16 0423  BP: 128/68   Pulse: 82   Resp: 13 (!) 8  Temp: 36.8 C     Last Pain:  Vitals:   06/22/16 0423  TempSrc:   PainSc: [place] Kurtiss Wence

## 2016-06-23 ENCOUNTER — Encounter (HOSPITAL_COMMUNITY): Payer: Self-pay | Admitting: General Surgery

## 2016-06-23 LAB — CBC
HCT: 33.8 % — ABNORMAL LOW (ref 39.0–52.0)
Hemoglobin: 10.2 g/dL — ABNORMAL LOW (ref 13.0–17.0)
MCH: 23.8 pg — AB (ref 26.0–34.0)
MCHC: 30.2 g/dL (ref 30.0–36.0)
MCV: 79 fL (ref 78.0–100.0)
PLATELETS: 475 10*3/uL — AB (ref 150–400)
RBC: 4.28 MIL/uL (ref 4.22–5.81)
RDW: 14.7 % (ref 11.5–15.5)
WBC: 8.4 10*3/uL (ref 4.0–10.5)

## 2016-06-23 LAB — BASIC METABOLIC PANEL
Anion gap: 7 (ref 5–15)
BUN: 8 mg/dL (ref 6–20)
CO2: 27 mmol/L (ref 22–32)
CREATININE: 0.72 mg/dL (ref 0.61–1.24)
Calcium: 8.2 mg/dL — ABNORMAL LOW (ref 8.9–10.3)
Chloride: 100 mmol/L — ABNORMAL LOW (ref 101–111)
GFR calc Af Amer: >60 mL/min (ref >60)
GLUCOSE: 189 mg/dL — AB (ref 65–99)
POTASSIUM: 4.3 mmol/L (ref 3.5–5.1)
SODIUM: 134 mmol/L — AB (ref 135–145)

## 2016-06-23 LAB — GLUCOSE, CAPILLARY
GLUCOSE-CAPILLARY: 258 mg/dL — AB (ref 65–99)
GLUCOSE-CAPILLARY: 237 mg/dL — AB (ref 65–99)
Glucose-Capillary: 177 mg/dL — ABNORMAL HIGH (ref 65–99)
Glucose-Capillary: 180 mg/dL — ABNORMAL HIGH (ref 65–99)
Glucose-Capillary: 220 mg/dL — ABNORMAL HIGH (ref 65–99)

## 2016-06-23 NOTE — Progress Notes (Signed)
Patient ID: Steve [name], male   DOB: 1961-09-04, 55 y.o.   MRN: [id number]  [facility] Surgery Progress Note  3 Days Post-Op  Subjective: Feeling slightly better this morning, abdominal pain is less. States that he did not use the PCA as much yesterday. Admits that he has not been ambulating very much. Denies n/v. No flatus or BM. WBC trending down.  Objective: Vital signs in last 24 hours: Temp:  [97.8 F (36.6 C)-98.6 F (37 C)] 98.4 F (36.9 C) (09/11 0442) Pulse Rate:  [83-101] 85 (09/11 0442) Resp:  [10-18] 12 (09/11 0506) BP: (113-146)/(65-85) 124/65 (09/11 0442) SpO2:  [92 %-97 %] 97 % (09/11 0506) Last BM Date:  (PTA)  Intake/Output from previous day: 09/10 0701 - 09/11 0700 In: 3032 [I.V.:3032] Out: 640 [Urine:525; Drains:115] Intake/Output this shift: No intake/output data recorded.  PE: Gen:  Alert, NAD, pleasant Card:  RRR Pulm:  CTAB Abd: Soft, appropriately tender, few but +BS,incision C/D/I, drain with serosanguinous drainage  RLQ JP drain 115cc output in 24 hours   Lab Results:   Recent Labs  06/22/16 0533 06/23/16 0220  WBC 10.2 8.4  HGB 10.6* 10.2*  HCT 34.7* 33.8*  PLT 412* 475*   BMET  Recent Labs  06/22/16 0533 06/23/16 0220  NA 135 134*  K 4.4 4.3  CL 102 100*  CO2 26 27  GLUCOSE 143* 189*  BUN 6 8  CREATININE 0.85 0.72  CALCIUM 8.2* 8.2*   PT/INR No results for input(s): LABPROT, INR in the last 72 hours. CMP     Component Value Date/Time   NA 134 (L) 06/23/2016 0220   K 4.3 06/23/2016 0220   CL 100 (L) 06/23/2016 0220   CO2 27 06/23/2016 0220   GLUCOSE 189 (H) 06/23/2016 0220   BUN 8 06/23/2016 0220   CREATININE 0.72 06/23/2016 0220   CREATININE 0.75 05/05/2014 1148   CALCIUM 8.2 (L) 06/23/2016 0220   PROT 7.2 06/20/2016 1422   ALBUMIN 3.2 (L) 06/20/2016 1422   AST 16 06/20/2016 1422   ALT 19 06/20/2016 1422   ALKPHOS 74 06/20/2016 1422   BILITOT 0.6 06/20/2016 1422   GFRNONAA >60 06/23/2016 0220   GFRNONAA >89 05/05/2014 1148   GFRAA >60 06/23/2016 0220   GFRAA >89 05/05/2014 1148   Lipase     Component Value Date/Time   LIPASE 19 06/20/2016 1422       Studies/Results: No results found.  Anti-infectives: Anti-infectives    Start     Dose/Rate Route Frequency Ordered Stop   06/21/16 0300  piperacillin-tazobactam (ZOSYN) IVPB 3.375 g     3.375 g 12.5 mL/hr over 240 Minutes Intravenous Every 8 hours 06/21/16 0055     06/20/16 1930  piperacillin-tazobactam (ZOSYN) IVPB 3.375 g  Status:  Discontinued     3.375 g 12.5 mL/hr over 240 Minutes Intravenous Every 8 hours 06/20/16 1901 06/21/16 0044   06/20/16 1845  Ampicillin-Sulbactam (UNASYN) 3 g in sodium chloride 0.9 % 100 mL IVPB  Status:  Discontinued     3 g 100 mL/hr over 60 Minutes Intravenous Every 8 hours 06/20/16 1826 06/20/16 1900       Assessment/Plan s/p Procedure(s): LAPAROSCOPIC CONVERTED TO OPEN APPENDECTOMY (N/A) 06/23/16 Dr. Redmond Pulling - POD 2 - WBC trending down, afebrile - abdominal pain is less - continue BID wet to dry dressing changes - no flatus  ID - zosyn day 3, nystatin day 2 VTE - lovenox, SCD's FEN - NPO  Plan - Awaiting bowel function.  Encouraged patient to ambulate more frequently. Continue NPO. Will likely try to d/c PCA tomorrow as long as pain continues to improve.     LOS: 2 days    Jerrye Beavers , [facility] Surgery 06/23/2016, 8:13 AM Pager: [phone] Consults: [phone] Mon-Fri 7:00 am-4:30 pm Sat-Sun 7:00 am-11:30 am

## 2016-06-24 DIAGNOSIS — Z794 Long term (current) use of insulin: Secondary | ICD-10-CM

## 2016-06-24 DIAGNOSIS — E1140 Type 2 diabetes mellitus with diabetic neuropathy, unspecified: Secondary | ICD-10-CM

## 2016-06-24 DIAGNOSIS — K37 Unspecified appendicitis: Secondary | ICD-10-CM

## 2016-06-24 DIAGNOSIS — I10 Essential (primary) hypertension: Secondary | ICD-10-CM

## 2016-06-24 DIAGNOSIS — K352 Acute appendicitis with generalized peritonitis: Secondary | ICD-10-CM

## 2016-06-24 LAB — GLUCOSE, CAPILLARY
GLUCOSE-CAPILLARY: 218 mg/dL — AB (ref 65–99)
GLUCOSE-CAPILLARY: 227 mg/dL — AB (ref 65–99)
Glucose-Capillary: 263 mg/dL — ABNORMAL HIGH (ref 65–99)
Glucose-Capillary: 172 mg/dL — ABNORMAL HIGH (ref 65–99)
Glucose-Capillary: 328 mg/dL — ABNORMAL HIGH (ref 65–99)
Glucose-Capillary: 231 mg/dL — ABNORMAL HIGH (ref 65–99)
Glucose-Capillary: 213 mg/dL — ABNORMAL HIGH (ref 65–99)

## 2016-06-24 MED ORDER — OXYCODONE-ACETAMINOPHEN 5-325 MG PO TABS: 1.0000 | ORAL_TABLET | ORAL | Status: DC | PRN

## 2016-06-24 MED ORDER — INSULIN NPH (HUMAN) (ISOPHANE) 100 UNIT/ML ~~LOC~~ SUSP
20.0000 [IU] | Freq: Two times a day (BID) | SUBCUTANEOUS | Status: DC
  Administered 2016-06-24 – 2016-06-25 (×2): 20 [IU] via SUBCUTANEOUS
  Filled 2016-06-24: qty 10

## 2016-06-24 NOTE — Progress Notes (Signed)
4 Days Post-Op  Subjective: He looks pretty good, walking some and passing some flatus. Tolerating clear liquids well. He is very sore and tender. He has a good deal of drainage through his JP. This fluid is clear. Glucoses are elevated and not well controlled with current sliding scale.  Objective: Vital signs in last 24 hours: Temp:  [98.5 F (36.9 C)-98.6 F (37 C)] 98.6 F (37 C) (09/12 0455) Pulse Rate:  [88-96] 96 (09/12 0455) Resp:  [9-19] 19 (09/12 0455) BP: (120-128)/(63-71) 128/71 (09/12 0455) SpO2:  [96 %-98 %] 96 % (09/12 0455) Last BM Date:  (PTA) 1300 PO 950 urine recorded Drain 670 Afebrile, VSS NO labs No film Intake/Output from previous day: 09/11 0701 - 09/12 0700 In: 4175 [P.O.:1300; I.V.:2875] Out: 1620 [Urine:950; Drains:670] Intake/Output this shift: No intake/output data recorded.  General appearance: alert, cooperative, no distress and Very sore and tender, but moving well Resp: clear to auscultation bilaterally GI: Few bowel sounds, drain is putting out a fair amount of clear serous fluid open wound looks good.  Lab Results:   Recent Labs  06/22/16 0533 06/23/16 0220  WBC 10.2 8.4  HGB 10.6* 10.2*  HCT 34.7* 33.8*  PLT 412* 475*    BMET  Recent Labs  06/22/16 0533 06/23/16 0220  NA 135 134*  K 4.4 4.3  CL 102 100*  CO2 26 27  GLUCOSE 143* 189*  BUN 6 8  CREATININE 0.85 0.72  CALCIUM 8.2* 8.2*   PT/INR No results for input(s): LABPROT, INR in the last 72 hours.   Recent Labs Lab 06/19/16 1601 06/20/16 1422  AST 13 16  ALT 18 19  ALKPHOS 67 74  BILITOT 0.5 0.6  PROT 7.3 7.2  ALBUMIN 3.9 3.2*     Lipase     Component Value Date/Time   LIPASE 19 06/20/2016 1422     Studies/Results: Prior to Admission medications   Medication Sig Start Date End Date Taking? Authorizing Provider  atorvastatin (LIPITOR) 10 MG tablet Take 1 tablet (10 mg total) by mouth daily. 05/19/16  Yes Brunetta Jeans, PA-C  dapagliflozin  propanediol (FARXIGA) 5 MG TABS tablet Take 5 mg by mouth daily. 05/19/16  Yes Brunetta Jeans, PA-C  dicyclomine (BENTYL) 10 MG capsule Take 1 capsule (10 mg total) by mouth 4 (four) times daily -  before meals and at bedtime. Patient taking differently: Take 10 mg by mouth 3 (three) times daily as needed.  06/19/16  Yes Crosby Oyster Wendling, DO  fenofibrate (TRICOR) 145 MG tablet Take 1 tablet (145 mg total) by mouth daily. 03/19/16  Yes Brunetta Jeans, PA-C  gabapentin (NEURONTIN) 300 MG capsule Take 600 mg each AM, 300 mg noon and 600 mg PM. Patient taking differently: Take 300-600 mg by mouth 3 (three) times daily. Take 600 mg each AM, 300 mg noon and 600 mg PM. 12/18/15  Yes Brunetta Jeans, PA-C  insulin lispro (HUMALOG KWIKPEN) 100 UNIT/ML KiwkPen Inject 0-12 Units into the skin 3 (three) times daily with meals. sliding scale- check sugar and inject 3 times daily before meals as below: <150- Zero units 150-200 2 units 201-250 4 units 251-300 6 units 301-350 8 units 351-400 10 units >400 12 units and contact us. 03/24/16  Yes Brunetta Jeans, PA-C  insulin NPH Human (HUMULIN N,NOVOLIN N) 100 UNIT/ML injection 30 units in the morning and 30-40 units at bedtime. 03/24/16  Yes Brunetta Jeans, PA-C  lisinopril (PRINIVIL,ZESTRIL) 5 MG tablet Take 1 tablet (  5 mg total) by mouth daily. 03/24/16  Yes Brunetta Jeans, PA-C  metFORMIN (GLUCOPHAGE) 1000 MG tablet Take 1 tablet (1,000 mg total) by mouth 2 (two) times daily with a meal. 06/19/16  Yes Shelda Pal, DO  Multiple Vitamin (MULTIVITAMIN WITH MINERALS) TABS tablet Take 1 tablet by mouth daily.   Yes Historical Provider, MD  Multiple Vitamins-Minerals (ZINC PO) Take 1 tablet by mouth daily.    Yes Historical Provider, MD  oxyCODONE-acetaminophen (PERCOCET) 5-325 MG tablet Take 1 tablet by mouth every 4 (four) hours as needed for moderate pain. 05/19/16  Yes Brunetta Jeans, PA-C  traMADol (ULTRAM) 50 MG tablet Take 1 tablet (50 mg total) by  mouth every 12 (twelve) hours as needed. 05/19/16  Yes Brunetta Jeans, PA-C     No results found. Marland Kitchen 0.45 % NaCl with KCl 20 mEq / L 125 mL/hr at 06/24/16 0005   Medications: . acetaminophen  1,000 mg Oral Q6H  . enoxaparin (LOVENOX) injection  40 mg Subcutaneous Q24H  . famotidine  20 mg Oral BID  . gabapentin  300 mg Oral Q1200  . gabapentin  600 mg Oral BID AC & HS  . HYDROmorphone   Intravenous Q4H  . insulin aspart  0-20 Units Subcutaneous Q4H  . lisinopril  5 mg Oral Daily  . nystatin  5 mL Oral QID  . piperacillin-tazobactam (ZOSYN)  IV  3.375 g Intravenous Q8H    Neuropathy CAD Hypertension Depression Hyperlipidemia Psoriasis   Assessment/Plan Ruptured acute appendicitis  S/P laparoscopic converted to open appendectomy, 06/21/16 Dr. Greer Pickerel - POD 3 Type II AODM - insulin dependant, complicated Insulin use  - ask medicine to assist with glucose control. FEN:  IV fluids/Clear liquids - go to full liquids ID:  Zosyn day 5  DVT:  Lovenox    Plan: Advance to a full liquid diet, medicine consult to assist with diabetes control. Continue PCA today, and oral pain medicines. Continue to mobilize and hopefully wean off PCA tomorrow. Recheck labs in a.m.  LOS: 3 days    Terissa Haffey 06/24/2016 [phone]

## 2016-06-24 NOTE — Consult Note (Signed)
Medical Consultation   Steve [name]  [id number]  DOB: April 05, 1961  DOA: 06/20/2016  PCP: Leeanne Rio, PA-C   Outpatient Specialists: none   Requesting physician: Dr Ninfa Linden - [facility]  Reason for consultation: DM management  History of Present Illness: Steve [name] is an 55 y.o. male with a history of ADHD, CAD, depression/anxiety, DM, HLD, HTN, psoriasis presenting to [facility] on 06/20/2016 due to right lower quadrant pain and low-grade fevers. Patient was found to have ruptured appendix. Appendectomy performed on 06/21/2016. Patient's recovery appears to be going well overall. At time of initial evaluation by me patient states that appetite is now returning and abdominal pain is subsiding rapidly. Denies any fevers, chest pain, shortness of breath, cough, lower extremity swelling. Patient still with abdominal pain consistent with surgical intervention.    Review of Systems:  ROS As per HPI otherwise 10 point review of systems negative.    Past Medical History: Past Medical History:  Diagnosis Date  . ADHD (attention deficit hyperactivity disorder)   . Allergy   . Anxiety   . CAD (coronary artery disease)   . Complication of anesthesia    has night terrors after anesthesia but can be violent when waking up  . Depression   . Diabetes mellitus without complication ([place])   . Frequent urination at night    when blood sugar runs high  . Hyperlipidemia   . Hypertension   . 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 Pt stated "I forget alot of things"  . Psoriasis of scalp    nose and face; occurs mainly in winter   . Seasonal allergies   . Sinus infection 12/16/13    Past Surgical History: Past Surgical History:  Procedure Laterality Date  . CARDIAC CATHETERIZATION     no PCI approx 10 years ago - [place]     right ring finger  . HERNIA REPAIR  2010   abdominal hernia  . KNEE ARTHROSCOPY  2012   left knee  . KNEE SURGERY    . LAPAROSCOPIC APPENDECTOMY N/A 06/20/2016   Procedure: LAPAROSCOPIC CONVERTED TO OPEN APPENDECTOMY;  Surgeon: Greer Pickerel, MD;  Location: [place];  Service: General;  Laterality: N/A;  . NASAL SEPTOPLASTY W/ TURBINOPLASTY  09/19/2015   Procedure: NASAL SEPTOPLASTY WITH TURBINATE REDUCTION;  Surgeon: Jodi Marble, MD;  Location: [place];  Service: ENT;;  . TOTAL KNEE ARTHROPLASTY Left 12/28/2013   Procedure: TOTAL KNEE ARTHROPLASTY;  Surgeon: Ninetta Lights, MD;  Location: [place];  Service: Orthopedics;  Laterality: Left;     Allergies:   Allergies  Allergen Reactions  . Sulfa Antibiotics Itching and Other (See Comments)    Burning also     Social History:  reports that he has never smoked. He has never used smokeless tobacco. He reports that he drinks about 0.6 - 1.2 oz of alcohol per week . He reports that he does not use drugs.   Family History: Family History  Problem Relation Age of Onset  . Diabetes Mother   . Cancer Mother     history breast cancer  . Hypertension Mother   . Cancer Father     prostate  . Alzheimer's disease Father   . Diabetes Father   . Hypertension Father   . Cancer Maternal Uncle     colon  . Colon cancer  Maternal Uncle 51  . Cancer Maternal Uncle     prostate  . Colon cancer Maternal Uncle 31  . Colon polyps Paternal Uncle   . Cancer Maternal Grandmother     breast  . Cancer Maternal Grandfather     prostate  . Cancer Maternal Uncle     prostate  . Cancer Cousin 47    metastatic colon cancer?  . Esophageal cancer Neg Hx   . Stomach cancer Neg Hx   . Rectal cancer Neg Hx      Physical Exam: Vitals:   06/24/16 0400 06/24/16 0455 06/24/16 0800 06/24/16 1113  BP:  128/71    Pulse:  96    Resp: 16 19 10 13   Temp:  98.6 F (37 C)    TempSrc:      SpO2: 98% 96% 97% 96%  Weight:      Height:        General:  Appears calm and comfortable Eyes:  PERRL, EOMI, normal lids, iris ENT:  grossly normal hearing,  lips & tongue, mmm Neck:  no LAD, masses or thyromegaly Cardiovascular:  RRR, no m/r/g. No LE edema.  Respiratory:  CTA bilaterally, no w/r/r. Normal respiratory effort. Abdomen: Obese, hypoactive bowel sounds, abdominal surgical dressings CDI.  Skin:  no rash or induration seen on limited exam Musculoskeletal:  grossly normal tone BUE/BLE, good ROM, no bony abnormality Psychiatric:  grossly normal mood and affect, speech fluent and appropriate, AOx3 Neurologic:  CN 2-12 grossly intact, moves all extremities in coordinated fashion, sensation intact  Data reviewed:  I have personally reviewed following labs and imaging studies Labs:  CBC:  Recent Labs Lab 06/19/16 1601 06/20/16 1422 06/22/16 0533 06/23/16 0220  WBC 15.7* 16.2* 10.2 8.4  NEUTROABS 12.8*  --   --   --   HGB 13.0 12.6* 10.6* 10.2*  HCT 39.6 38.4* 34.7* 33.8*  MCV 75.3* 78.0 79.6 79.0  PLT 346.0 409* 412* 475*    Basic Metabolic Panel:  Recent Labs Lab 06/19/16 1601 06/20/16 1422 06/22/16 0533 06/23/16 0220  NA 134* 134* 135 134*  K 4.0 4.4 4.4 4.3  CL 96 100* 102 100*  CO2 29 25 26 27   GLUCOSE 147* 266* 143* 189*  BUN 8 6 6 8   CREATININE 0.88 0.97 0.85 0.72  CALCIUM 8.8 8.9 8.2* 8.2*   GFR Estimated Creatinine Clearance (by C-G formula based on SCr of 0.8 mg/dL) Male: 126.7 mL/min Male: 153.2 mL/min Liver Function Tests:  Recent Labs Lab 06/19/16 1601 06/20/16 1422  AST 13 16  ALT 18 19  ALKPHOS 67 74  BILITOT 0.5 0.6  PROT 7.3 7.2  ALBUMIN 3.9 3.2*    Recent Labs Lab 06/20/16 1422  LIPASE 19   No results for input(s): AMMONIA in the last 168 hours. Coagulation profile No results for input(s): INR, PROTIME in the last 168 hours.  Cardiac Enzymes: No results for input(s): CKTOTAL, CKMB, CKMBINDEX, TROPONINI in the last 168 hours. BNP: Invalid input(s): POCBNP CBG:  Recent Labs Lab 06/23/16 1602 06/23/16 2005 06/24/16 0023 06/24/16 0449 06/24/16 0905  GLUCAP 258* 237*  218* 263* 172*   D-Dimer No results for input(s): DDIMER in the last 72 hours. Hgb A1c No results for input(s): HGBA1C in the last 72 hours. Lipid Profile No results for input(s): CHOL, HDL, LDLCALC, TRIG, CHOLHDL, LDLDIRECT in the last 72 hours. Thyroid function studies No results for input(s): TSH, T4TOTAL, T3FREE, THYROIDAB in the last 72 hours.  Invalid input(s): FREET3 Anemia work  up No results for input(s): VITAMINB12, FOLATE, FERRITIN, TIBC, IRON, RETICCTPCT in the last 72 hours. Urinalysis    Component Value Date/Time   COLORURINE YELLOW 06/20/2016 1818   APPEARANCEUR CLEAR 06/20/2016 1818   LABSPEC >1.046 (H) 06/20/2016 1818   PHURINE 6.5 06/20/2016 1818   GLUCOSEU 100 (A) 06/20/2016 1818   HGBUR NEGATIVE 06/20/2016 1818   BILIRUBINUR NEGATIVE 06/20/2016 1818   KETONESUR NEGATIVE 06/20/2016 1818   PROTEINUR NEGATIVE 06/20/2016 1818   UROBILINOGEN 0.2 12/20/2013 1301   NITRITE NEGATIVE 06/20/2016 1818   LEUKOCYTESUR NEGATIVE 06/20/2016 1818     Microbiology Recent Results (from the past 240 hour(s))  Urine culture     Status: Abnormal   Collection Time: 06/20/16  6:18 PM  Result Value Ref Range Status   Specimen Description URINE, RANDOM  Final   Special Requests NONE  Final   Culture 4,000 COLONIES/mL INSIGNIFICANT GROWTH (A)  Final   Report Status 06/22/2016 FINAL  Final       Inpatient Medications:   Scheduled Meds: . acetaminophen  1,000 mg Oral Q6H  . enoxaparin (LOVENOX) injection  40 mg Subcutaneous Q24H  . famotidine  20 mg Oral BID  . gabapentin  300 mg Oral Q1200  . gabapentin  600 mg Oral BID AC & HS  . HYDROmorphone   Intravenous Q4H  . insulin aspart  0-20 Units Subcutaneous Q4H  . insulin NPH Human  20 Units Subcutaneous BID AC & HS  . lisinopril  5 mg Oral Daily  . nystatin  5 mL Oral QID  . piperacillin-tazobactam (ZOSYN)  IV  3.375 g Intravenous Q8H   Continuous Infusions: . 0.45 % NaCl with KCl 20 mEq / L 125 mL/hr at 06/24/16  0005     Radiological Exams on Admission: No results found.  Impression/Recommendations Active Problems:   DM neuropathy, type II diabetes mellitus ([place])   Essential hypertension, benign   Ruptured appendicitis  Ruptured appendicitis: Patient is POD# 3.  - Management per primary team/CCS  Diabetes: on Farxiga, Humalog, Humulin, metformin at home. Poorly controlled. Last A1c 9.1 (03/2016) - equivilant Avg CBG of ~240. Not optimal for wound healing.  - Resume 1/2 dose Humulin. Increase to home dose once taking full PO - continue SSI - Hold orals  - A1c. - DM education  HTN: well controlled - continue lisinopril  DM neuropathy: - continue Neurontin  Thank you for this consultation.  Our [facility] hospitalist team will follow the patient with you.     MERRELL, DAVID J M.D. [facility] 06/24/2016, 11:58 AM

## 2016-06-24 NOTE — Progress Notes (Addendum)
Inpatient Diabetes Program Recommendations  AACE/ADA: New Consensus Statement on Inpatient Glycemic Control (2015)  Target Ranges:  Prepandial:   less than 140 mg/dL      Peak postprandial:   less than 180 mg/dL (1-2 hours)      Critically ill patients:  140 - 180 mg/dL   Lab Results  Component Value Date   GLUCAP 172 (H) 06/24/2016   HGBA1C 9.1 (H) 03/24/2016    Review of Glycemic Control  Diabetes history: DM 2 Outpatient Diabetes medications: Farxiga 5 mg Daily, NPH 30 units QAM, 30-40 units QPM, Humalog 0-12 units TID, Metformin 1,000 mg BID Current orders for Inpatient glycemic control: Novolog Resistant Q4 hrs  Inpatient Diabetes Program Recommendations:  Glucose elevated. Patient takes NPH at home. Please consider restarting NPH 10-15 units BID while inpatient. May need to reduce Novolog correction scale to Moderate (0-15 units).  Thanks,  Tama Headings RN, MSN, [facility] Inpatient Diabetes Coordinator Team Pager [phone] (8a-5p)

## 2016-06-25 LAB — CBC
HEMATOCRIT: 31.1 % — AB (ref 39.0–52.0)
Hemoglobin: 9.3 g/dL — ABNORMAL LOW (ref 13.0–17.0)
MCH: 23.6 pg — AB (ref 26.0–34.0)
MCHC: 29.9 g/dL — ABNORMAL LOW (ref 30.0–36.0)
MCV: 78.9 fL (ref 78.0–100.0)
Platelets: 515 10*3/uL — ABNORMAL HIGH (ref 150–400)
RBC: 3.94 MIL/uL — ABNORMAL LOW (ref 4.22–5.81)
RDW: 14.8 % (ref 11.5–15.5)
WBC: 6 10*3/uL (ref 4.0–10.5)

## 2016-06-25 LAB — BASIC METABOLIC PANEL
ANION GAP: 9 (ref 5–15)
CALCIUM: 8.3 mg/dL — AB (ref 8.9–10.3)
CO2: 26 mmol/L (ref 22–32)
Chloride: 100 mmol/L — ABNORMAL LOW (ref 101–111)
Creatinine, Ser: 0.63 mg/dL (ref 0.61–1.24)
GFR calc Af Amer: >60 mL/min (ref >60)
GFR calc non Af Amer: >60 mL/min (ref >60)
GLUCOSE: 248 mg/dL — AB (ref 65–99)
POTASSIUM: 4.5 mmol/L (ref 3.5–5.1)
Sodium: 135 mmol/L (ref 135–145)

## 2016-06-25 LAB — GLUCOSE, CAPILLARY
GLUCOSE-CAPILLARY: 164 mg/dL — AB (ref 65–99)
Glucose-Capillary: 220 mg/dL — ABNORMAL HIGH (ref 65–99)
Glucose-Capillary: 225 mg/dL — ABNORMAL HIGH (ref 65–99)
Glucose-Capillary: 280 mg/dL — ABNORMAL HIGH (ref 65–99)
Glucose-Capillary: 116 mg/dL — ABNORMAL HIGH (ref 65–99)

## 2016-06-25 MED ORDER — INSULIN GLARGINE 100 UNIT/ML ~~LOC~~ SOLN
25.0000 [IU] | Freq: Every day | SUBCUTANEOUS | Status: DC
  Administered 2016-06-25: 25 [IU] via SUBCUTANEOUS
  Filled 2016-06-25 (×2): qty 0.25

## 2016-06-25 MED ORDER — OXYCODONE HCL 5 MG PO TABS
5.0000 mg | ORAL_TABLET | ORAL | Status: DC | PRN
  Administered 2016-06-25 (×3): 10 mg via ORAL
  Filled 2016-06-25 (×4): qty 2

## 2016-06-25 MED ORDER — INSULIN ASPART 100 UNIT/ML ~~LOC~~ SOLN
5.0000 [IU] | Freq: Three times a day (TID) | SUBCUTANEOUS | Status: DC
  Administered 2016-06-25 – 2016-06-26 (×2): 5 [IU] via SUBCUTANEOUS

## 2016-06-25 MED ORDER — HYDROMORPHONE HCL 1 MG/ML IJ SOLN
0.5000 mg | INTRAMUSCULAR | Status: DC | PRN
  Administered 2016-06-25 – 2016-06-26 (×4): 1 mg via INTRAVENOUS
  Filled 2016-06-25 (×4): qty 1

## 2016-06-25 NOTE — Progress Notes (Signed)
5 Days Post-Op  Subjective: He is doing better, lots of flatus, but no Bm.  He has been up walking, and tolerating full liquids well.  Objective: Vital signs in last 24 hours: Temp:  [98.4 F (36.9 C)-98.8 F (37.1 C)] 98.6 F (37 C) (09/13 0650) Pulse Rate:  [81-91] 81 (09/13 0650) Resp:  [10-18] 18 (09/13 0650) BP: (132-148)/(71-75) 132/71 (09/13 0650) SpO2:  [91 %-98 %] 95 % (09/13 0650) Last BM Date: 06/21/16 240 PO recorded Voided 3 recorded Drain 310 mL recorded No BM recorded Afebrile, VSS Glucose 248 this a.m. still running in the 200s WBC 6000 hemoglobin down 1 g from 06/23/16  Intake/Output from previous day: 09/12 0701 - 09/13 0700 In: 240 [P.O.:240] Out: 310 [Drains:310] Intake/Output this shift: No intake/output data recorded.  General appearance: alert, cooperative and no distress Resp: clear to auscultation bilaterally GI: Soft,, sore, open portion of wound looks good, few bowel sounds and flatus no BM.Marland Kitchen Drainage is serosanguineous.  Lab Results:   Recent Labs  06/23/16 0220 06/25/16 0224  WBC 8.4 6.0  HGB 10.2* 9.3*  HCT 33.8* 31.1*  PLT 475* 515*    BMET  Recent Labs  06/23/16 0220 06/25/16 0224  NA 134* 135  K 4.3 4.5  CL 100* 100*  CO2 27 26  GLUCOSE 189* 248*  BUN 8 <5*  CREATININE 0.72 0.63  CALCIUM 8.2* 8.3*   PT/INR No results for input(s): LABPROT, INR in the last 72 hours.   Recent Labs Lab 06/19/16 1601 06/20/16 1422  AST 13 16  ALT 18 19  ALKPHOS 67 74  BILITOT 0.5 0.6  PROT 7.3 7.2  ALBUMIN 3.9 3.2*     Lipase     Component Value Date/Time   LIPASE 19 06/20/2016 1422     Studies/Results: No results found.  Medications: . acetaminophen  1,000 mg Oral Q6H  . enoxaparin (LOVENOX) injection  40 mg Subcutaneous Q24H  . famotidine  20 mg Oral BID  . gabapentin  300 mg Oral Q1200  . gabapentin  600 mg Oral BID AC & HS  . HYDROmorphone   Intravenous Q4H  . insulin aspart  0-20 Units Subcutaneous Q4H  .  insulin NPH Human  20 Units Subcutaneous BID AC & HS  . lisinopril  5 mg Oral Daily  . nystatin  5 mL Oral QID  . piperacillin-tazobactam (ZOSYN)  IV  3.375 g Intravenous Q8H   . 0.45 % NaCl with KCl 20 mEq / L 125 mL/hr at 06/24/16 1507    Neuropathy CAD Hypertension Depression Hyperlipidemia Psoriasis   Assessment/Plan Ruptured acute appendicitis  S/P laparoscopic converted to open appendectomy, 06/21/16 Dr. Greer Pickerel - POD 4 Type II AODM - insulin dependant, complicated Insulin use  - ask medicine to assist with glucose control. FEN:  IV fluids/Clear liquids - go to full liquids ID:  Zosyn day 6 DVT:  Lovenox  Plan: DC PCA, continue Tylenol and add oxycodone PO, decrease IV fluids. I Told him if he had a bowel movement later today we will advance to a diabetic carb modified diet.       LOS: 4 days    JENNINGS,WILLARD 06/25/2016 [phone]  Up walking around and making good progress.   Agree with above Kaylyn Lim, MD

## 2016-06-25 NOTE — Progress Notes (Signed)
PROGRESS NOTE  Steve [name]  [id number] DOB: 1961-02-20 DOA: 06/20/2016 PCP: Leeanne Rio, PA-C Outpatient Specialists:  Subjective: Feels okay, did not have bowel movement yet was walking around the unit this morning.  Brief Narrative:  55 year old with uncontrolled diabetes mellitus type 2 presented to the hospital with ruptured appendicitis, [facility] consulted for diabetes management.  Assessment & Plan:   Active Problems:   DM neuropathy, type II diabetes mellitus ([place])   Essential hypertension, benign   Ruptured appendicitis   Appendicitis   Ruptured appendicitis:  -Patient is POD# 3.  -Management per primary team/CCS  Diabetes mellitus type II uncontrolled:  -On Farxiga, Humalog, Humulin, metformin at home.  -Poorly controlled. Last A1c 9.1 (03/2016) - equivilant Avg CBG of ~240. Not optimal for wound healing.  -We'll recheck hemoglobin A1c, patient on 4 different hypoglycemic agents TRY to simplify the regimen. -Lantus and NovoLog with meals and likely metformin on discharge. Blood sugar is 248 this morning.  HTN: well controlled - continue lisinopril  DM neuropathy: - continue Neurontin   DVT prophylaxis:  Lovenox Code Status: Full Code Family Communication:  Disposition Plan:  Diet: Diet full liquid Room service appropriate? Yes; Fluid consistency: Thin  Consultants:   TRH  Procedures:     Antimicrobials:   Objective: Vitals:   06/24/16 2310 06/25/16 0406 06/25/16 0650 06/25/16 1438  BP:   132/71 134/70  Pulse:   81 89  Resp: 16 17 18 18   Temp:   98.6 F (37 C) 98.7 F (37.1 C)  TempSrc:    Oral  SpO2: 98% 98% 95% 96%  Weight:      Height:        Intake/Output Summary (Last 24 hours) at 06/25/16 1532 Last data filed at 06/25/16 1441  Gross per 24 hour  Intake              490 ml  Output              175 ml  Net              315 ml   Filed Weights   06/21/16 0126  Weight: 123.4 kg (272 lb 0.8 oz)     Examination: General exam: Appears calm and comfortable  Respiratory system: Clear to auscultation. Respiratory effort normal. Cardiovascular system: S1 & S2 heard, RRR. No JVD, murmurs, rubs, gallops or clicks. No pedal edema. Gastrointestinal system: Abdomen is nondistended, soft and nontender. No organomegaly or masses felt. Normal bowel sounds heard. Central nervous system: Alert and oriented. No focal neurological deficits. Extremities: Symmetric 5 x 5 power. Skin: No rashes, lesions or ulcers Psychiatry: Judgement and insight appear normal. Mood & affect appropriate.   Data Reviewed: I have personally reviewed following labs and imaging studies  CBC:  Recent Labs Lab 06/19/16 1601 06/20/16 1422 06/22/16 0533 06/23/16 0220 06/25/16 0224  WBC 15.7* 16.2* 10.2 8.4 6.0  NEUTROABS 12.8*  --   --   --   --   HGB 13.0 12.6* 10.6* 10.2* 9.3*  HCT 39.6 38.4* 34.7* 33.8* 31.1*  MCV 75.3* 78.0 79.6 79.0 78.9  PLT 346.0 409* 412* 475* [id number]*   Basic Metabolic Panel:  Recent Labs Lab 06/19/16 1601 06/20/16 1422 06/22/16 0533 06/23/16 0220 06/25/16 0224  NA 134* 134* 135 134* 135  K 4.0 4.4 4.4 4.3 4.5  CL 96 100* 102 100* 100*  CO2 29 25 26 27 26   GLUCOSE 147* 266* 143* 189* 248*  BUN 8 6 6  8 <5*  CREATININE 0.88 0.97 0.85 0.72 0.63  CALCIUM 8.8 8.9 8.2* 8.2* 8.3*   GFR: Estimated Creatinine Clearance (by C-G formula based on SCr of 0.63 mg/dL) Male: 126.7 mL/min Male: 153.2 mL/min Liver Function Tests:  Recent Labs Lab 06/19/16 1601 06/20/16 1422  AST 13 16  ALT 18 19  ALKPHOS 67 74  BILITOT 0.5 0.6  PROT 7.3 7.2  ALBUMIN 3.9 3.2*    Recent Labs Lab 06/20/16 1422  LIPASE 19   No results for input(s): AMMONIA in the last 168 hours. Coagulation Profile: No results for input(s): INR, PROTIME in the last 168 hours. Cardiac Enzymes: No results for input(s): CKTOTAL, CKMB, CKMBINDEX, TROPONINI in the last 168 hours. BNP (last 3 results) No results  for input(s): PROBNP in the last 8760 hours. HbA1C: No results for input(s): HGBA1C in the last 72 hours. CBG:  Recent Labs Lab 06/24/16 2008 06/24/16 2332 06/25/16 0359 06/25/16 0747 06/25/16 1201  GLUCAP 213* 227* 220* 164* 225*   Lipid Profile: No results for input(s): CHOL, HDL, LDLCALC, TRIG, CHOLHDL, LDLDIRECT in the last 72 hours. Thyroid Function Tests: No results for input(s): TSH, T4TOTAL, FREET4, T3FREE, THYROIDAB in the last 72 hours. Anemia Panel: No results for input(s): VITAMINB12, FOLATE, FERRITIN, TIBC, IRON, RETICCTPCT in the last 72 hours. Urine analysis:    Component Value Date/Time   COLORURINE YELLOW 06/20/2016 1818   APPEARANCEUR CLEAR 06/20/2016 1818   LABSPEC >1.046 (H) 06/20/2016 1818   PHURINE 6.5 06/20/2016 1818   GLUCOSEU 100 (A) 06/20/2016 1818   HGBUR NEGATIVE 06/20/2016 1818   BILIRUBINUR NEGATIVE 06/20/2016 1818   KETONESUR NEGATIVE 06/20/2016 1818   PROTEINUR NEGATIVE 06/20/2016 1818   UROBILINOGEN 0.2 12/20/2013 1301   NITRITE NEGATIVE 06/20/2016 1818   LEUKOCYTESUR NEGATIVE 06/20/2016 1818   Sepsis Labs: @LABRCNTIP (procalcitonin:4,lacticidven:4)  ) Recent Results (from the past 240 hour(s))  Urine culture     Status: Abnormal   Collection Time: 06/20/16  6:18 PM  Result Value Ref Range Status   Specimen Description URINE, RANDOM  Final   Special Requests NONE  Final   Culture 4,000 COLONIES/mL INSIGNIFICANT GROWTH (A)  Final   Report Status 06/22/2016 FINAL  Final     Invalid input(s): PROCALCITONIN, [place]   Radiology Studies: No results found.      Scheduled Meds: . acetaminophen  1,000 mg Oral Q6H  . enoxaparin (LOVENOX) injection  40 mg Subcutaneous Q24H  . famotidine  20 mg Oral BID  . gabapentin  300 mg Oral Q1200  . gabapentin  600 mg Oral BID AC & HS  . insulin aspart  0-20 Units Subcutaneous Q4H  . insulin NPH Human  20 Units Subcutaneous BID AC & HS  . lisinopril  5 mg Oral Daily  . nystatin  5  mL Oral QID  . piperacillin-tazobactam (ZOSYN)  IV  3.375 g Intravenous Q8H   Continuous Infusions: . 0.45 % NaCl with KCl 20 mEq / L 50 mL/hr at 06/25/16 1030     LOS: 4 days    Time spent: 35 minutes    Lorrinda Ramstad A, MD [facility] Pager [phone]  If 7PM-7AM, please contact night-coverage [email or web address] Password [facility] 06/25/2016, 3:32 PM

## 2016-06-26 LAB — BASIC METABOLIC PANEL
ANION GAP: 9 (ref 5–15)
CHLORIDE: 100 mmol/L — AB (ref 101–111)
CO2: 29 mmol/L (ref 22–32)
Calcium: 8.8 mg/dL — ABNORMAL LOW (ref 8.9–10.3)
Creatinine, Ser: 0.78 mg/dL (ref 0.61–1.24)
GFR calc Af Amer: >60 mL/min (ref >60)
GFR calc non Af Amer: >60 mL/min (ref >60)
Glucose, Bld: 197 mg/dL — ABNORMAL HIGH (ref 65–99)
Potassium: 4.8 mmol/L (ref 3.5–5.1)
Sodium: 138 mmol/L (ref 135–145)

## 2016-06-26 LAB — GLUCOSE, CAPILLARY
GLUCOSE-CAPILLARY: 178 mg/dL — AB (ref 65–99)
GLUCOSE-CAPILLARY: 246 mg/dL — AB (ref 65–99)
Glucose-Capillary: 172 mg/dL — ABNORMAL HIGH (ref 65–99)
Glucose-Capillary: 197 mg/dL — ABNORMAL HIGH (ref 65–99)

## 2016-06-26 MED ORDER — INSULIN LISPRO 100 UNIT/ML (KWIKPEN): 10.0000 [IU] | PEN_INJECTOR | Freq: Three times a day (TID) | SUBCUTANEOUS | 11 refills | Status: AC

## 2016-06-26 MED ORDER — OXYCODONE HCL 5 MG PO TABS: 5.0000 mg | ORAL_TABLET | ORAL | 0 refills | Status: AC | PRN

## 2016-06-26 MED ORDER — SIMETHICONE 80 MG PO CHEW
80.0000 mg | CHEWABLE_TABLET | Freq: Four times a day (QID) | ORAL | Status: DC | PRN
  Administered 2016-06-26: 80 mg via ORAL
  Filled 2016-06-26: qty 1

## 2016-06-26 MED ORDER — INSULIN GLARGINE 100 UNIT/ML ~~LOC~~ SOLN: 30.0000 [IU] | Freq: Every day | SUBCUTANEOUS | 11 refills | Status: AC

## 2016-06-26 MED ORDER — INSULIN ASPART 100 UNIT/ML ~~LOC~~ SOLN
0.0000 [IU] | Freq: Three times a day (TID) | SUBCUTANEOUS | Status: DC
  Administered 2016-06-26: 4 [IU] via SUBCUTANEOUS
  Administered 2016-06-26: 7 [IU] via SUBCUTANEOUS

## 2016-06-26 NOTE — Progress Notes (Signed)
Discharge home. Home discharge instruction given, no question verbalized. HH needs addressed by Nira Conn, RN.

## 2016-06-26 NOTE — Discharge Summary (Signed)
[facility] Discharge Summary   Patient ID: Steve [name] MRN: [id number] DOB/AGE: [birth year] 55 y.o.  Admit date: 06/20/2016 Discharge date: 06/26/16  Admitting Diagnosis: Appendicitis  Discharge Diagnosis Patient Active Problem List   Diagnosis Date Noted  . Appendicitis   . Ruptured appendicitis 06/21/2016  . Plantar fasciitis 01/10/2016  . Hammertoe 01/10/2016  . Deviated nasal septum 09/19/2015  . Abdominal pain 07/26/2015  . Mass of leg 06/18/2015  . Numbness of left foot 03/23/2015  . Cellulitis of leg, left 08/01/2014  . Accessory skin tags 08/01/2014  . Hypogonadism in male 07/24/2014  . Essential hypertension, benign 07/24/2014  . Post herpetic neuralgia 05/05/2014  . Skin lesion 04/09/2014  . Gout 12/06/2013  . Allergic rhinitis 09/07/2013  . Atypical chest pain 07/15/2013  . Left ankle pain 06/14/2013  . Rib injury 06/14/2013  . Pain in left ankle w/ effusion 05/24/2013  . Routine general medical examination at a health care facility 04/11/2013  . DM neuropathy, type II diabetes mellitus ([place]) 07/26/2012  . Hyperlipidemia 07/26/2012  . 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 ED (erectile dysfunction) 07/26/2012    Consultants Dr. Linna Darner - Family Medicine  Imaging: CT ABD/PELV W CONTRAST (06/20/16) - Examination is positive for marked right lower quadrant inflammatory change. This is favored to represent sequelae of acute appendicitis. Alternatively findings may reflect an infectious or inflammatory enteritis with secondary inflammation of the appendix.  Procedures Dr. Greer Pickerel (06/21/16) - laparoscopic converted to open appendectomy  Hospital Course:  55 y/o obese male with PMH CAD, HTN, DM, and mild OSA who presented with one week of peri-umbilical pain that become constant and located between RLQ and umbilicus. Physical exam significant for RLQ tenderness, voluntary guarding, and localized peritonitis. WBC 16. CT scan  as above. Patient was admitted, started on IV Zosyn, and underwent procedure listed above. Upon entering the abdomen laparoscopically, the surgeon appreciated what appeared to be a contained perforation of the appendix. After mobilizing the terminal ileum and sigmoid colon from the area of the appendix, which contained dense inflammation, it was unclear where the base of the appendix was and the procedure was converted to open using a mini-midline incision to successfully dissect the mesoappendix from the retroperitoneum and identify the appendiceal base communicating with the cecum. A JP drain was left in the RLQ. The mini-midline incision was left open and packed with moist gauze, ABDs, and tape. Postoperatively the patient was transferred to the floor. On POD#5 the patient had regained bowel function, pain was controlled, tolerated diet, and ambulating. His JP drain, draining serous fluid, was discontinued prior to discharge. He was discharged home with home health for wound care and he will follow up with Dr. Redmond Pulling in about 2 weeks. He did have issues controlling his blood sugars during his hospitalization so he should follow-up with his PCP after discharge for further monitoring and management of his diabetes mellitus.   Physical Exam: General:  Alert, NAD, pleasant, comfortable Abd:  Soft, ND, mild tenderness, incisions C/D/I, drain with minimal serous drainage    Medication List    STOP taking these medications   dapagliflozin propanediol 5 MG Tabs tablet Commonly known as:  FARXIGA   insulin NPH Human 100 UNIT/ML injection Commonly known as:  HUMULIN N,NOVOLIN N   metFORMIN 1000 MG tablet Commonly known as:  GLUCOPHAGE   oxyCODONE-acetaminophen 5-325 MG tablet Commonly known as:  PERCOCET   traMADol 50 MG tablet Commonly known as:  Veatrice Bourbon  TAKE these medications   atorvastatin 10 MG tablet Commonly known as:  LIPITOR Take 1 tablet (10 mg total) by mouth daily.   dicyclomine  10 MG capsule Commonly known as:  BENTYL Take 1 capsule (10 mg total) by mouth 4 (four) times daily -  before meals and at bedtime. What changed:  when to take this  reasons to take this   fenofibrate 145 MG tablet Commonly known as:  TRICOR Take 1 tablet (145 mg total) by mouth daily.   gabapentin 300 MG capsule Commonly known as:  NEURONTIN Take 600 mg each AM, 300 mg noon and 600 mg PM. What changed:  how much to take  how to take this  when to take this  additional instructions   insulin glargine 100 UNIT/ML injection Commonly known as:  LANTUS Inject 0.3 mLs (30 Units total) into the skin at bedtime.   insulin lispro 100 UNIT/ML KiwkPen Commonly known as:  HUMALOG KWIKPEN Inject 0.1 mLs (10 Units total) into the skin 3 (three) times daily. What changed:  how much to take  how to take this  when to take this  additional instructions   lisinopril 5 MG tablet Commonly known as:  PRINIVIL,ZESTRIL Take 1 tablet (5 mg total) by mouth daily.   multivitamin with minerals Tabs tablet Take 1 tablet by mouth daily.   oxyCODONE 5 MG immediate release tablet Commonly known as:  Oxy IR/ROXICODONE Take 1-2 tablets (5-10 mg total) by mouth every 4 (four) hours as needed for moderate pain or severe pain.   ZINC PO Take 1 tablet by mouth daily.        Follow-up Information    Gayland Curry, MD. Go on 07/09/2016.   Specialty:  General Surgery Why:  Your appointment for post-operative follow-up is at 11:45 AM. please arrive 30 minutes early to get checked in and fill out any ncessary paperwork. Contact information: [address] [place] [address] [id number]        Leeanne Rio, PA-C. Schedule an appointment as soon as possible for a visit today.   Specialty:  Family Medicine Why:  to follow-up on your new insulin regimen.  Contact information: [place] [address] [phone]            Signed: Obie Dredge, [facility] 06/27/2016, 12:42 PM Pager: [phone] Consults: [phone] Mon-Fri 7:00 am-4:30 pm Sat-Sun 7:00 am-11:30 am

## 2016-06-26 NOTE — Care Management Note (Signed)
Case Management Note  Patient Details  Name: Steve [name] MRN: [id number] Date of Birth: October 27, 1960  Subjective/Objective:                    Action/Plan:  Confirmed face sheet information , wife is able to assist. Expected Discharge Date:                  Expected Discharge Plan:  [place]  In-House Referral:     Discharge planning Services  CM Consult  Post Acute Care Choice:  Home Health Choice offered to:  Patient  DME Arranged:    DME Agency:     HH Arranged:  RN [place] Agency:  [facility] (now [facility]  Status of Service:  Completed, signed off  If discussed at [employer] of Stay Meetings, dates discussed:    Additional Comments:  Marilu Favre, RN 06/26/2016, 11:25 AM

## 2016-06-26 NOTE — Progress Notes (Signed)
PROGRESS NOTE  Steve [name]  [id number] DOB: [birth year] DOA: 06/20/2016 PCP: Leeanne Rio, PA-C Outpatient Specialists:  Subjective: Feels okay, did not have bowel movement yet was walking around the unit this morning.  Brief Narrative:  55 year old with uncontrolled diabetes mellitus type 2 presented to the hospital with ruptured appendicitis, [facility] hospitalist consulted for diabetes management.  Assessment & Plan:   Active Problems:   DM neuropathy, type II diabetes mellitus ([place])   Essential hypertension, benign   Ruptured appendicitis   Appendicitis   Ruptured appendicitis:  -Patient is POD# 4.  -Had bowel movement, per wife ready for discharge.  Diabetes mellitus type II uncontrolled:  -On Farxiga, Humalog, Humulin, metformin at home.  -Poorly controlled. Last A1c 9.1 (03/2016) - equivilant Avg CBG of ~240. Not optimal for wound healing.  -We'll recheck hemoglobin A1c, patient on 4 different hypoglycemic agents TRY to simplify the regimen. -Recommended insulin regimen -Lantus 30 units daily at bedtime. -Humalog 10 units with meals, follow-up with PCP for further adjustments.  HTN: well controlled - continue lisinopril  DM neuropathy: - continue Neurontin   DVT prophylaxis:  Lovenox Code Status: Full Code Family Communication:  Disposition Plan:  Diet: DIET SOFT Room service appropriate? Yes; Fluid consistency: Thin  Consultants:   [facility]  Procedures:     Antimicrobials:   Objective: Vitals:   06/25/16 1438 06/25/16 2148 06/26/16 0556 06/26/16 0842  BP: 134/70 136/68 (!) 158/74 139/79  Pulse: 89 97 79 84  Resp: 18 18 18 12   Temp: 98.7 F (37.1 C) 98.8 F (37.1 C) 98.8 F (37.1 C) 97.9 F (36.6 C)  TempSrc: Oral Oral Oral Oral  SpO2: 96% 95% 98% 95%  Weight:      Height:        Intake/Output Summary (Last 24 hours) at 06/26/16 1234 Last data filed at 06/26/16 1039  Gross per 24 hour  Intake             1170 ml  Output               195 ml  Net              975 ml   Filed Weights   06/21/16 0126  Weight: 123.4 kg (272 lb 0.8 oz)    Examination: General exam: Appears calm and comfortable  Respiratory system: Clear to auscultation. Respiratory effort normal. Cardiovascular system: S1 & S2 heard, RRR. No JVD, murmurs, rubs, gallops or clicks. No pedal edema. Gastrointestinal system: Abdomen is nondistended, soft and nontender. No organomegaly or masses felt. Normal bowel sounds heard. Central nervous system: Alert and oriented. No focal neurological deficits. Extremities: Symmetric 5 x 5 power. Skin: No rashes, lesions or ulcers Psychiatry: Judgement and insight appear normal. Mood & affect appropriate.   Data Reviewed: I have personally reviewed following labs and imaging studies  CBC:  Recent Labs Lab 06/19/16 1601 06/20/16 1422 06/22/16 0533 06/23/16 0220 06/25/16 0224  WBC 15.7* 16.2* 10.2 8.4 6.0  NEUTROABS 12.8*  --   --   --   --   HGB 13.0 12.6* 10.6* 10.2* 9.3*  HCT 39.6 38.4* 34.7* 33.8* 31.1*  MCV 75.3* 78.0 79.6 79.0 78.9  PLT 346.0 409* 412* 475* [id number]*   Basic Metabolic Panel:  Recent Labs Lab 06/20/16 1422 06/22/16 0533 06/23/16 0220 06/25/16 0224 06/26/16 0633  NA 134* 135 134* 135 138  K 4.4 4.4 4.3 4.5 4.8  CL 100* 102 100* 100* 100*  CO2 25 26 27  26  29  GLUCOSE 266* 143* 189* 248* 197*  BUN 6 6 8  <5* <5*  CREATININE 0.97 0.85 0.72 0.63 0.78  CALCIUM 8.9 8.2* 8.2* 8.3* 8.8*   GFR: Estimated Creatinine Clearance (by C-G formula based on SCr of 0.78 mg/dL) Male: 126.7 mL/min Male: 153.2 mL/min Liver Function Tests:  Recent Labs Lab 06/19/16 1601 06/20/16 1422  AST 13 16  ALT 18 19  ALKPHOS 67 74  BILITOT 0.5 0.6  PROT 7.3 7.2  ALBUMIN 3.9 3.2*    Recent Labs Lab 06/20/16 1422  LIPASE 19   No results for input(s): AMMONIA in the last 168 hours. Coagulation Profile: No results for input(s): INR, PROTIME in the last 168 hours. Cardiac Enzymes: No  results for input(s): CKTOTAL, CKMB, CKMBINDEX, TROPONINI in the last 168 hours. BNP (last 3 results) No results for input(s): PROBNP in the last 8760 hours. HbA1C: No results for input(s): HGBA1C in the last 72 hours. CBG:  Recent Labs Lab 06/25/16 2046 06/26/16 0105 06/26/16 0552 06/26/16 0804 06/26/16 1214  GLUCAP 116* 178* 172* 197* 246*   Lipid Profile: No results for input(s): CHOL, HDL, LDLCALC, TRIG, CHOLHDL, LDLDIRECT in the last 72 hours. Thyroid Function Tests: No results for input(s): TSH, T4TOTAL, FREET4, T3FREE, THYROIDAB in the last 72 hours. Anemia Panel: No results for input(s): VITAMINB12, FOLATE, FERRITIN, TIBC, IRON, RETICCTPCT in the last 72 hours. Urine analysis:    Component Value Date/Time   COLORURINE YELLOW 06/20/2016 1818   APPEARANCEUR CLEAR 06/20/2016 1818   LABSPEC >1.046 (H) 06/20/2016 1818   PHURINE 6.5 06/20/2016 1818   GLUCOSEU 100 (A) 06/20/2016 1818   HGBUR NEGATIVE 06/20/2016 1818   BILIRUBINUR NEGATIVE 06/20/2016 1818   KETONESUR NEGATIVE 06/20/2016 1818   PROTEINUR NEGATIVE 06/20/2016 1818   UROBILINOGEN 0.2 12/20/2013 1301   NITRITE NEGATIVE 06/20/2016 1818   LEUKOCYTESUR NEGATIVE 06/20/2016 1818   Sepsis Labs: @LABRCNTIP (procalcitonin:4,lacticidven:4)  ) Recent Results (from the past 240 hour(s))  Urine culture     Status: Abnormal   Collection Time: 06/20/16  6:18 PM  Result Value Ref Range Status   Specimen Description URINE, RANDOM  Final   Special Requests NONE  Final   Culture 4,000 COLONIES/mL INSIGNIFICANT GROWTH (A)  Final   Report Status 06/22/2016 FINAL  Final     Invalid input(s): PROCALCITONIN, [place]   Radiology Studies: No results found.      Scheduled Meds: . acetaminophen  1,000 mg Oral Q6H  . enoxaparin (LOVENOX) injection  40 mg Subcutaneous Q24H  . famotidine  20 mg Oral BID  . gabapentin  300 mg Oral Q1200  . gabapentin  600 mg Oral BID AC & HS  . insulin aspart  0-20 Units  Subcutaneous TID WC  . insulin aspart  5 Units Subcutaneous TID WC  . insulin glargine  25 Units Subcutaneous QHS  . lisinopril  5 mg Oral Daily  . nystatin  5 mL Oral QID  . piperacillin-tazobactam (ZOSYN)  IV  3.375 g Intravenous Q8H   Continuous Infusions: . 0.45 % NaCl with KCl 20 mEq / L 50 mL/hr at 06/26/16 0600     LOS: 5 days    Time spent: 35 minutes    Rockwell Zentz A, MD [facility] Pager [phone]  If 7PM-7AM, please contact night-coverage [email or web address] Password [facility] 06/26/2016, 12:34 PM

## 2016-06-26 NOTE — Discharge Instructions (Signed)
[facility]      [facility] [phone]  OPEN ABDOMINAL SURGERY: POST OP INSTRUCTIONS  Always review your discharge instruction sheet given to you by the facility where your surgery was performed.  IF YOU HAVE DISABILITY OR FAMILY LEAVE FORMS, YOU MUST BRING THEM TO THE OFFICE FOR PROCESSING.  PLEASE DO NOT GIVE THEM TO YOUR DOCTOR.  1. A prescription for pain medication may be given to you upon discharge.  Take your pain medication as prescribed, if needed.  If narcotic pain medicine is not needed, then you may take acetaminophen (Tylenol) or ibuprofen (Advil) as needed. 2. Take your usually prescribed medications unless otherwise directed. 3. If you need a refill on your pain medication, please contact your pharmacy. They will contact our office to request authorization.  Prescriptions will not be filled after 5pm or on week-ends. 4. You should follow a light diet the first few days after arrival home, such as soup and crackers, pudding, etc.unless your doctor has advised otherwise. A high-fiber, low fat diet can be resumed as tolerated.   Be sure to include lots of fluids daily. Most patients will experience some swelling and bruising on the chest and neck area.  Ice packs will help.  Swelling and bruising can take several days to resolve 5. Most patients will experience some swelling and bruising in the area of the incision. Ice pack will help. Swelling and bruising can take several days to resolve..  6. It is common to experience some constipation if taking pain medication after surgery.  Increasing fluid intake and taking a stool softener will usually help or prevent this problem from occurring.  A mild laxative (Milk of Magnesia or Miralax) should be taken according to package directions if there are no bowel movements after 48 hours. 7.  You may have steri-strips (small skin tapes) in place directly over the incision.  These strips should be left on the skin for 7-10 days.  If your  surgeon used skin glue on the incision, you may shower in 24 hours.  The glue will flake off over the next 2-3 weeks.  Any sutures or staples will be removed at the office during your follow-up visit. You may find that a light gauze bandage over your incision may keep your staples from being rubbed or pulled. You may shower and replace the bandage daily. 8. ACTIVITIES:  You may resume regular (light) daily activities beginning the next day--such as daily self-care, walking, climbing stairs--gradually increasing activities as tolerated.  You may have sexual intercourse when it is comfortable.  Refrain from any heavy lifting or straining until approved by your doctor. a. You may drive when you no longer are taking prescription pain medication, you can comfortably wear a seatbelt, and you can safely maneuver your car and apply brakes b. Return to Work: ___________________________________ 9. You should see your doctor in the office for a follow-up appointment approximately two weeks after your surgery.  Make sure that you call for this appointment within a day or two after you arrive home to insure a convenient appointment time. OTHER INSTRUCTIONS:  _____________________________________________________________ _____________________________________________________________  WHEN TO CALL YOUR DOCTOR: 1. Fever over 101.0 2. Inability to urinate 3. Nausea and/or vomiting 4. Extreme swelling or bruising 5. Continued bleeding from incision. 6. Increased pain, redness, or drainage from the incision. 7. Difficulty swallowing or breathing 8. Muscle cramping or spasms. 9. Numbness or tingling in hands or feet or around lips.  The clinic staff is available to   answer your questions during regular business hours.  Please don't hesitate to call and ask to speak to one of the nurses if you have concerns.  For further questions, please visit [email or web address]   

## 2016-07-02 ENCOUNTER — Ambulatory Visit (INDEPENDENT_AMBULATORY_CARE_PROVIDER_SITE_OTHER): Payer: BLUE CROSS/BLUE SHIELD | Admitting: Physician Assistant

## 2016-07-02 VITALS — BP 118/68 | HR 96 | Temp 98.3°F | Resp 18 | Ht 76.0 in | Wt 252.2 lb

## 2016-07-02 DIAGNOSIS — R2241 Localized swelling, mass and lump, right lower limb: Secondary | ICD-10-CM | POA: Diagnosis not present

## 2016-07-02 DIAGNOSIS — K3589 Other acute appendicitis without perforation or gangrene: Secondary | ICD-10-CM

## 2016-07-02 DIAGNOSIS — Z794 Long term (current) use of insulin: Secondary | ICD-10-CM | POA: Diagnosis not present

## 2016-07-02 DIAGNOSIS — E1140 Type 2 diabetes mellitus with diabetic neuropathy, unspecified: Secondary | ICD-10-CM

## 2016-07-02 LAB — CBC
HCT: 38.6 % — ABNORMAL LOW (ref 39.0–52.0)
Hemoglobin: 12.3 g/dL — ABNORMAL LOW (ref 13.0–17.0)
MCHC: 32 g/dL (ref 30.0–36.0)
MCV: 75 fl — ABNORMAL LOW (ref 78.0–100.0)
RBC: 5.14 Mil/uL (ref 4.22–5.81)
RDW: 15.7 % — ABNORMAL HIGH (ref 11.5–15.5)
WBC: 8.9 10*3/uL (ref 4.0–10.5)

## 2016-07-02 LAB — COMPREHENSIVE METABOLIC PANEL
ALBUMIN: 3.7 g/dL (ref 3.5–5.2)
ALT: 36 U/L (ref 0–53)
AST: 26 U/L (ref 0–37)
Alkaline Phosphatase: 76 U/L (ref 39–117)
BILIRUBIN TOTAL: 0.2 mg/dL (ref 0.2–1.2)
BUN: 11 mg/dL (ref 6–23)
CALCIUM: 9.6 mg/dL (ref 8.4–10.5)
CO2: 29 mEq/L (ref 19–32)
CREATININE: 0.75 mg/dL (ref 0.40–1.50)
Chloride: 99 mEq/L (ref 96–112)
GFR: 115.68 mL/min (ref >60.00)
Glucose, Bld: 125 mg/dL — ABNORMAL HIGH (ref 70–99)
Potassium: 4.7 mEq/L (ref 3.5–5.1)
SODIUM: 137 meq/L (ref 135–145)
Total Protein: 7.4 g/dL (ref 6.0–8.3)

## 2016-07-02 LAB — HEMOGLOBIN A1C: HEMOGLOBIN A1C: 9.6 % — AB (ref 4.6–6.5)

## 2016-07-02 MED ORDER — METFORMIN HCL 1000 MG PO TABS
1000.0000 mg | ORAL_TABLET | Freq: Two times a day (BID) | ORAL | 3 refills | Status: AC
Start: 2016-07-02 — End: ?

## 2016-07-02 NOTE — Progress Notes (Signed)
Patient presents to clinic today for hospital follow-up for acute ruptured appendicitis s/p appendectomy. Patient presented to ER on  06/20/16 with complaints of one week of significant periumbilical pain radiating to right lower quadrant. CT obtain revealing acute appendicitis. Patient elected for surgery. Initially scheduled for laparoscopic appendectomy, but was changed to open appendectomy. Appendix successfully removed. Patient tolerated procedure well and was discharged on POD#5 to follow-up with wound care and here in primary care.   Since discharge, patient states he has been doing well overall. Is having some mild tenderness at incision site. Denies drainage. Wife has been performing dressing changes. She states he has tolerated this well. Patient denies overt abdominal pain. Denies nausea or vomiting. Denies fever, chills or malaise. Note some fatigue. Is eating and hydrating well. Denies difficulty with bowel movement or urination. Denies blood in stool.  Patient denies leg swelling, chest pain or shortness of breath.  Of note, patient and wife complaining of a superficial knot of dorsum of R foot. Has been present for greater than 6 months. Is unchanged. Patient denies tenderness of the area. Denies decreased range of motion of extremity.  Past Medical History:  Diagnosis Date  . ADHD (attention deficit hyperactivity disorder)   . Allergy   . Anxiety   . CAD (coronary artery disease)   . Complication of anesthesia    has night terrors after anesthesia but can be violent when waking up  . Depression   . Diabetes mellitus without complication ([place])   . Frequent urination at night    when blood sugar runs high  . Hyperlipidemia   . Hypertension   . 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 Pt stated "I forget alot of things"  . Psoriasis of scalp    nose and face; occurs mainly in winter   . Seasonal allergies   . Sinus infection 12/16/13    Current Outpatient  Prescriptions on File Prior to Visit  Medication Sig Dispense Refill  . atorvastatin (LIPITOR) 10 MG tablet Take 1 tablet (10 mg total) by mouth daily. 30 tablet 5  . dicyclomine (BENTYL) 10 MG capsule Take 1 capsule (10 mg total) by mouth 4 (four) times daily -  before meals and at bedtime. (Patient taking differently: Take 10 mg by mouth 3 (three) times daily as needed. ) 30 capsule 0  . fenofibrate (TRICOR) 145 MG tablet Take 1 tablet (145 mg total) by mouth daily. 30 tablet 1  . gabapentin (NEURONTIN) 300 MG capsule Take 600 mg each AM, 300 mg noon and 600 mg PM. (Patient taking differently: Take 300-600 mg by mouth 3 (three) times daily. Take 600 mg each AM, 300 mg noon and 600 mg PM.) 450 capsule 1  . insulin glargine (LANTUS) 100 UNIT/ML injection Inject 0.3 mLs (30 Units total) into the skin at bedtime. 10 mL 11  . insulin lispro (HUMALOG KWIKPEN) 100 UNIT/ML KiwkPen Inject 0.1 mLs (10 Units total) into the skin 3 (three) times daily. (Patient taking differently: Inject 10 Units into the skin 3 (three) times daily. Sliding Scale) 15 mL 11  . lisinopril (PRINIVIL,ZESTRIL) 5 MG tablet Take 1 tablet (5 mg total) by mouth daily. 30 tablet 6  . Multiple Vitamins-Minerals (ZINC PO) Take 1 tablet by mouth daily.     Marland Kitchen oxyCODONE (OXY IR/ROXICODONE) 5 MG immediate release tablet Take 1-2 tablets (5-10 mg total) by mouth every 4 (four) hours as needed for moderate pain or severe pain. 30 tablet  0   No current facility-administered medications on file prior to visit.     Allergies  Allergen Reactions  . Sulfa Antibiotics Itching and Other (See Comments)    Burning also    Family History  Problem Relation Age of Onset  . Diabetes Mother   . Cancer Mother     history breast cancer  . Hypertension Mother   . Cancer Father     prostate  . Alzheimer's disease Father   . Diabetes Father   . Hypertension Father   . Cancer Maternal Uncle     colon  . Colon cancer Maternal Uncle 72  . Cancer  Maternal Uncle     prostate  . Colon cancer Maternal Uncle 53  . Colon polyps Paternal Uncle   . Cancer Maternal Grandmother     breast  . Cancer Maternal Grandfather     prostate  . Cancer Maternal Uncle     prostate  . Cancer Cousin 47    metastatic colon cancer?  . Esophageal cancer Neg Hx   . Stomach cancer Neg Hx   . Rectal cancer Neg Hx     Social History   Social History  . Marital status: Married    Spouse name: N/A  . Number of children: 6  . Years of education: N/A   Social History Main Topics  . Smoking status: Never Smoker  . Smokeless tobacco: Never Used  . Alcohol use 0.6 - 1.2 oz/week    1 - 2 Cans of beer per week     Comment: rare beer  . Drug use: No  . Sexual activity: Yes    Partners: Female   Other Topics Concern  . Not on file   Social History Narrative   Regular exercise: very little   Caffeine use: sweet tea daily   6 children   Works in Architect, owns concession.   Married   Enjoys televition.     Review of Systems - See HPI.  All other ROS are negative.  BP 118/68 (BP Location: Right Arm, Patient Position: Sitting, Cuff Size: Large)   Pulse 96   Temp 98.3 F (36.8 C) (Oral)   Resp 18   Ht '6\' 4"'$  (1.93 m)   Wt 252 lb 4 oz (114.4 kg)   SpO2 97%   BMI 30.70 kg/m   Physical Exam  Constitutional: He is oriented to person, place, and time and well-developed, well-nourished, and in no distress.  HENT:  Head: Normocephalic and atraumatic.  Eyes: Conjunctivae are normal.  Neck: Neck supple.  Cardiovascular: Normal rate, regular rhythm, normal heart sounds and intact distal pulses.   Pulmonary/Chest: Effort normal and breath sounds normal. No respiratory distress. He has no wheezes. He has no rales. He exhibits no tenderness.  Abdominal: Soft. Bowel sounds are normal. He exhibits no distension and no mass. There is no tenderness.  Incision site dressed well by wife. Incision with routine healing. No purulent drainage or increased  erythema noted.  Musculoskeletal:       Right ankle: Normal.       Right foot: There is normal range of motion and no tenderness.       Feet:  Neurological: He is alert and oriented to person, place, and time.  Skin: Skin is warm and dry. No rash noted.  Psychiatric: Affect normal.  Vitals reviewed.   Recent Results (from the past 2160 hour(s))  POCT Glucose (Device for Home Use)     Status: Abnormal  Collection Time: 04/23/16 12:27 PM  Result Value Ref Range   Glucose Fasting, POC 274 (A) 70 - 99 mg/dL   POC Glucose  70 - 99 mg/dl  HM DIABETES EYE EXAM     Status: None   Collection Time: 04/29/16 12:00 AM  Result Value Ref Range   HM Diabetic Eye Exam No Retinopathy No Retinopathy  CBC w/Diff     Status: Abnormal   Collection Time: 06/19/16  4:01 PM  Result Value Ref Range   WBC 15.7 (H) 4.0 - 10.5 K/uL   RBC 5.25 4.22 - 5.81 Mil/uL   Hemoglobin 13.0 13.0 - 17.0 g/dL   HCT 39.6 39.0 - 52.0 %   MCV 75.3 (L) 78.0 - 100.0 fl   MCHC 32.8 30.0 - 36.0 g/dL   RDW 15.9 (H) 11.5 - 15.5 %   Platelets 346.0 150.0 - 400.0 K/uL   Neutrophils Relative % 81.2 (H) 43.0 - 77.0 %   Lymphocytes Relative 12.1 12.0 - 46.0 %   Monocytes Relative 6.0 3.0 - 12.0 %   Eosinophils Relative 0.5 0.0 - 5.0 %   Basophils Relative 0.2 0.0 - 3.0 %   Neutro Abs 12.8 (H) 1.4 - 7.7 K/uL   Lymphs Abs 1.9 0.7 - 4.0 K/uL   Monocytes Absolute 0.9 0.1 - 1.0 K/uL   Eosinophils Absolute 0.1 0.0 - 0.7 K/uL   Basophils Absolute 0.0 0.0 - 0.1 K/uL  Comprehensive metabolic panel     Status: Abnormal   Collection Time: 06/19/16  4:01 PM  Result Value Ref Range   Sodium 134 (L) 135 - 145 mEq/L   Potassium 4.0 3.5 - 5.1 mEq/L   Chloride 96 96 - 112 mEq/L   CO2 29 19 - 32 mEq/L   Glucose, Bld 147 (H) 70 - 99 mg/dL   BUN 8 6 - 23 mg/dL   Creatinine, Ser 0.88 0.40 - 1.50 mg/dL   Total Bilirubin 0.5 0.2 - 1.2 mg/dL   Alkaline Phosphatase 67 39 - 117 U/L   AST 13 0 - 37 U/L   ALT 18 0 - 53 U/L   Total Protein  7.3 6.0 - 8.3 g/dL   Albumin 3.9 3.5 - 5.2 g/dL   Calcium 8.8 8.4 - 10.5 mg/dL   GFR 96.20 >60.00 mL/min  Lipase, blood     Status: None   Collection Time: 06/20/16  2:22 PM  Result Value Ref Range   Lipase 19 11 - 51 U/L  Comprehensive metabolic panel     Status: Abnormal   Collection Time: 06/20/16  2:22 PM  Result Value Ref Range   Sodium 134 (L) 135 - 145 mmol/L   Potassium 4.4 3.5 - 5.1 mmol/L   Chloride 100 (L) 101 - 111 mmol/L   CO2 25 22 - 32 mmol/L   Glucose, Bld 266 (H) 65 - 99 mg/dL   BUN 6 6 - 20 mg/dL   Creatinine, Ser 0.97 0.61 - 1.24 mg/dL   Calcium 8.9 8.9 - 10.3 mg/dL   Total Protein 7.2 6.5 - 8.1 g/dL   Albumin 3.2 (L) 3.5 - 5.0 g/dL   AST 16 15 - 41 U/L   ALT 19 17 - 63 U/L   Alkaline Phosphatase 74 38 - 126 U/L   Total Bilirubin 0.6 0.3 - 1.2 mg/dL   GFR calc non Af Amer >60 >60 mL/min   GFR calc Af Amer >60 >60 mL/min    Comment: (NOTE) The eGFR has been calculated using the CKD  EPI equation. This calculation has not been validated in all clinical situations. eGFR's persistently <60 mL/min signify possible Chronic Kidney Disease.    Anion gap 9 5 - 15  CBC     Status: Abnormal   Collection Time: 06/20/16  2:22 PM  Result Value Ref Range   WBC 16.2 (H) 4.0 - 10.5 K/uL   RBC 4.92 4.22 - 5.81 MIL/uL   Hemoglobin 12.6 (L) 13.0 - 17.0 g/dL   HCT 38.4 (L) 39.0 - 52.0 %   MCV 78.0 78.0 - 100.0 fL   MCH 25.6 (L) 26.0 - 34.0 pg   MCHC 32.8 30.0 - 36.0 g/dL   RDW 14.5 11.5 - 15.5 %   Platelets 409 (H) 150 - 400 K/uL  CBG monitoring, ED     Status: Abnormal   Collection Time: 06/20/16  5:59 PM  Result Value Ref Range   Glucose-Capillary 236 (H) 65 - 99 mg/dL   Comment 1 Notify RN   Urinalysis, Routine w reflex microscopic     Status: Abnormal   Collection Time: 06/20/16  6:18 PM  Result Value Ref Range   Color, Urine YELLOW YELLOW   APPearance CLEAR CLEAR   Specific Gravity, Urine >1.046 (H) 1.005 - 1.030   pH 6.5 5.0 - 8.0   Glucose, UA 100 (A)  NEGATIVE mg/dL   Hgb urine dipstick NEGATIVE NEGATIVE   Bilirubin Urine NEGATIVE NEGATIVE   Ketones, ur NEGATIVE NEGATIVE mg/dL   Protein, ur NEGATIVE NEGATIVE mg/dL   Nitrite NEGATIVE NEGATIVE   Leukocytes, UA NEGATIVE NEGATIVE    Comment: MICROSCOPIC NOT DONE ON URINES WITH NEGATIVE PROTEIN, BLOOD, LEUKOCYTES, NITRITE, OR GLUCOSE <1000 mg/dL.  Urine culture     Status: Abnormal   Collection Time: 06/20/16  6:18 PM  Result Value Ref Range   Specimen Description URINE, RANDOM    Special Requests NONE    Culture 4,000 COLONIES/mL INSIGNIFICANT GROWTH (A)    Report Status 06/22/2016 FINAL   Glucose, capillary     Status: Abnormal   Collection Time: 06/21/16 12:01 AM  Result Value Ref Range   Glucose-Capillary 323 (H) 65 - 99 mg/dL  Glucose, capillary     Status: Abnormal   Collection Time: 06/21/16 12:50 AM  Result Value Ref Range   Glucose-Capillary 327 (H) 65 - 99 mg/dL  Glucose, capillary     Status: Abnormal   Collection Time: 06/21/16  4:04 AM  Result Value Ref Range   Glucose-Capillary 301 (H) 65 - 99 mg/dL   Comment 1 Notify RN    Comment 2 Document in Chart   Glucose, capillary     Status: Abnormal   Collection Time: 06/21/16  7:52 AM  Result Value Ref Range   Glucose-Capillary 273 (H) 65 - 99 mg/dL  Glucose, capillary     Status: Abnormal   Collection Time: 06/21/16 12:56 PM  Result Value Ref Range   Glucose-Capillary 198 (H) 65 - 99 mg/dL  Glucose, capillary     Status: Abnormal   Collection Time: 06/21/16  4:49 PM  Result Value Ref Range   Glucose-Capillary 124 (H) 65 - 99 mg/dL  Glucose, capillary     Status: Abnormal   Collection Time: 06/21/16  7:43 PM  Result Value Ref Range   Glucose-Capillary 138 (H) 65 - 99 mg/dL  Glucose, capillary     Status: Abnormal   Collection Time: 06/21/16 11:46 PM  Result Value Ref Range   Glucose-Capillary 132 (H) 65 - 99 mg/dL  Glucose, capillary  Status: Abnormal   Collection Time: 06/22/16  4:01 AM  Result Value Ref  Range   Glucose-Capillary 142 (H) 65 - 99 mg/dL  CBC     Status: Abnormal   Collection Time: 06/22/16  5:33 AM  Result Value Ref Range   WBC 10.2 4.0 - 10.5 K/uL   RBC 4.36 4.22 - 5.81 MIL/uL   Hemoglobin 10.6 (L) 13.0 - 17.0 g/dL   HCT 34.7 (L) 39.0 - 52.0 %   MCV 79.6 78.0 - 100.0 fL   MCH 24.3 (L) 26.0 - 34.0 pg   MCHC 30.5 30.0 - 36.0 g/dL   RDW 14.7 11.5 - 15.5 %   Platelets 412 (H) 150 - 400 K/uL  Basic metabolic panel     Status: Abnormal   Collection Time: 06/22/16  5:33 AM  Result Value Ref Range   Sodium 135 135 - 145 mmol/L   Potassium 4.4 3.5 - 5.1 mmol/L   Chloride 102 101 - 111 mmol/L   CO2 26 22 - 32 mmol/L   Glucose, Bld 143 (H) 65 - 99 mg/dL   BUN 6 6 - 20 mg/dL   Creatinine, Ser 0.85 0.61 - 1.24 mg/dL   Calcium 8.2 (L) 8.9 - 10.3 mg/dL   GFR calc non Af Amer >60 >60 mL/min   GFR calc Af Amer >60 >60 mL/min    Comment: (NOTE) The eGFR has been calculated using the CKD EPI equation. This calculation has not been validated in all clinical situations. eGFR's persistently <60 mL/min signify possible Chronic Kidney Disease.    Anion gap 7 5 - 15  Glucose, capillary     Status: Abnormal   Collection Time: 06/22/16  7:49 AM  Result Value Ref Range   Glucose-Capillary 149 (H) 65 - 99 mg/dL   Comment 1 Notify RN   Glucose, capillary     Status: Abnormal   Collection Time: 06/22/16 12:06 PM  Result Value Ref Range   Glucose-Capillary 193 (H) 65 - 99 mg/dL  Glucose, capillary     Status: Abnormal   Collection Time: 06/22/16  4:11 PM  Result Value Ref Range   Glucose-Capillary 216 (H) 65 - 99 mg/dL   Comment 1 Notify RN   Glucose, capillary     Status: Abnormal   Collection Time: 06/22/16  7:45 PM  Result Value Ref Range   Glucose-Capillary 165 (H) 65 - 99 mg/dL  Glucose, capillary     Status: Abnormal   Collection Time: 06/22/16 11:50 PM  Result Value Ref Range   Glucose-Capillary 202 (H) 65 - 99 mg/dL  CBC     Status: Abnormal   Collection Time:  06/23/16  2:20 AM  Result Value Ref Range   WBC 8.4 4.0 - 10.5 K/uL   RBC 4.28 4.22 - 5.81 MIL/uL   Hemoglobin 10.2 (L) 13.0 - 17.0 g/dL   HCT 33.8 (L) 39.0 - 52.0 %   MCV 79.0 78.0 - 100.0 fL   MCH 23.8 (L) 26.0 - 34.0 pg   MCHC 30.2 30.0 - 36.0 g/dL   RDW 14.7 11.5 - 15.5 %   Platelets 475 (H) 150 - 400 K/uL  Basic metabolic panel     Status: Abnormal   Collection Time: 06/23/16  2:20 AM  Result Value Ref Range   Sodium 134 (L) 135 - 145 mmol/L   Potassium 4.3 3.5 - 5.1 mmol/L   Chloride 100 (L) 101 - 111 mmol/L   CO2 27 22 - 32 mmol/L   Glucose,  Bld 189 (H) 65 - 99 mg/dL   BUN 8 6 - 20 mg/dL   Creatinine, Ser 0.72 0.61 - 1.24 mg/dL   Calcium 8.2 (L) 8.9 - 10.3 mg/dL   GFR calc non Af Amer >60 >60 mL/min   GFR calc Af Amer >60 >60 mL/min    Comment: (NOTE) The eGFR has been calculated using the CKD EPI equation. This calculation has not been validated in all clinical situations. eGFR's persistently <60 mL/min signify possible Chronic Kidney Disease.    Anion gap 7 5 - 15  Glucose, capillary     Status: Abnormal   Collection Time: 06/23/16  4:41 AM  Result Value Ref Range   Glucose-Capillary 177 (H) 65 - 99 mg/dL  Glucose, capillary     Status: Abnormal   Collection Time: 06/23/16  8:07 AM  Result Value Ref Range   Glucose-Capillary 180 (H) 65 - 99 mg/dL  Glucose, capillary     Status: Abnormal   Collection Time: 06/23/16 12:18 PM  Result Value Ref Range   Glucose-Capillary 220 (H) 65 - 99 mg/dL  Glucose, capillary     Status: Abnormal   Collection Time: 06/23/16  4:02 PM  Result Value Ref Range   Glucose-Capillary 258 (H) 65 - 99 mg/dL  Glucose, capillary     Status: Abnormal   Collection Time: 06/23/16  8:05 PM  Result Value Ref Range   Glucose-Capillary 237 (H) 65 - 99 mg/dL  Glucose, capillary     Status: Abnormal   Collection Time: 06/24/16 12:23 AM  Result Value Ref Range   Glucose-Capillary 218 (H) 65 - 99 mg/dL   Comment 1 Notify RN    Comment 2  Document in Chart   Glucose, capillary     Status: Abnormal   Collection Time: 06/24/16  4:49 AM  Result Value Ref Range   Glucose-Capillary 263 (H) 65 - 99 mg/dL   Comment 1 Notify RN    Comment 2 Document in Chart   Glucose, capillary     Status: Abnormal   Collection Time: 06/24/16  9:05 AM  Result Value Ref Range   Glucose-Capillary 172 (H) 65 - 99 mg/dL  Glucose, capillary     Status: Abnormal   Collection Time: 06/24/16 12:22 PM  Result Value Ref Range   Glucose-Capillary 328 (H) 65 - 99 mg/dL  Glucose, capillary     Status: Abnormal   Collection Time: 06/24/16  4:38 PM  Result Value Ref Range   Glucose-Capillary 231 (H) 65 - 99 mg/dL  Glucose, capillary     Status: Abnormal   Collection Time: 06/24/16  8:08 PM  Result Value Ref Range   Glucose-Capillary 213 (H) 65 - 99 mg/dL   Comment 1 Notify RN    Comment 2 Document in Chart   Glucose, capillary     Status: Abnormal   Collection Time: 06/24/16 11:32 PM  Result Value Ref Range   Glucose-Capillary 227 (H) 65 - 99 mg/dL  CBC     Status: Abnormal   Collection Time: 06/25/16  2:24 AM  Result Value Ref Range   WBC 6.0 4.0 - 10.5 K/uL   RBC 3.94 (L) 4.22 - 5.81 MIL/uL   Hemoglobin 9.3 (L) 13.0 - 17.0 g/dL   HCT 31.1 (L) 39.0 - 52.0 %   MCV 78.9 78.0 - 100.0 fL   MCH 23.6 (L) 26.0 - 34.0 pg   MCHC 29.9 (L) 30.0 - 36.0 g/dL   RDW 14.8 11.5 - 15.5 %  Platelets 515 (H) 150 - 400 K/uL  Basic metabolic panel     Status: Abnormal   Collection Time: 06/25/16  2:24 AM  Result Value Ref Range   Sodium 135 135 - 145 mmol/L   Potassium 4.5 3.5 - 5.1 mmol/L   Chloride 100 (L) 101 - 111 mmol/L   CO2 26 22 - 32 mmol/L   Glucose, Bld 248 (H) 65 - 99 mg/dL   BUN <5 (L) 6 - 20 mg/dL   Creatinine, Ser 0.63 0.61 - 1.24 mg/dL   Calcium 8.3 (L) 8.9 - 10.3 mg/dL   GFR calc non Af Amer >60 >60 mL/min   GFR calc Af Amer >60 >60 mL/min    Comment: (NOTE) The eGFR has been calculated using the CKD EPI equation. This calculation has  not been validated in all clinical situations. eGFR's persistently <60 mL/min signify possible Chronic Kidney Disease.    Anion gap 9 5 - 15  Glucose, capillary     Status: Abnormal   Collection Time: 06/25/16  3:59 AM  Result Value Ref Range   Glucose-Capillary 220 (H) 65 - 99 mg/dL   Comment 1 Notify RN    Comment 2 Document in Chart   Glucose, capillary     Status: Abnormal   Collection Time: 06/25/16  7:47 AM  Result Value Ref Range   Glucose-Capillary 164 (H) 65 - 99 mg/dL  Glucose, capillary     Status: Abnormal   Collection Time: 06/25/16 12:01 PM  Result Value Ref Range   Glucose-Capillary 225 (H) 65 - 99 mg/dL  Glucose, capillary     Status: Abnormal   Collection Time: 06/25/16  4:40 PM  Result Value Ref Range   Glucose-Capillary 280 (H) 65 - 99 mg/dL  Glucose, capillary     Status: Abnormal   Collection Time: 06/25/16  8:46 PM  Result Value Ref Range   Glucose-Capillary 116 (H) 65 - 99 mg/dL  Glucose, capillary     Status: Abnormal   Collection Time: 06/26/16  1:05 AM  Result Value Ref Range   Glucose-Capillary 178 (H) 65 - 99 mg/dL  Glucose, capillary     Status: Abnormal   Collection Time: 06/26/16  5:52 AM  Result Value Ref Range   Glucose-Capillary 172 (H) 65 - 99 mg/dL  Basic metabolic panel     Status: Abnormal   Collection Time: 06/26/16  6:33 AM  Result Value Ref Range   Sodium 138 135 - 145 mmol/L   Potassium 4.8 3.5 - 5.1 mmol/L   Chloride 100 (L) 101 - 111 mmol/L   CO2 29 22 - 32 mmol/L   Glucose, Bld 197 (H) 65 - 99 mg/dL   BUN <5 (L) 6 - 20 mg/dL   Creatinine, Ser 0.78 0.61 - 1.24 mg/dL   Calcium 8.8 (L) 8.9 - 10.3 mg/dL   GFR calc non Af Amer >60 >60 mL/min   GFR calc Af Amer >60 >60 mL/min    Comment: (NOTE) The eGFR has been calculated using the CKD EPI equation. This calculation has not been validated in all clinical situations. eGFR's persistently <60 mL/min signify possible Chronic Kidney Disease.    Anion gap 9 5 - 15  Glucose,  capillary     Status: Abnormal   Collection Time: 06/26/16  8:04 AM  Result Value Ref Range   Glucose-Capillary 197 (H) 65 - 99 mg/dL   Comment 1 Notify RN   Glucose, capillary     Status: Abnormal   Collection  Time: 06/26/16 12:14 PM  Result Value Ref Range   Glucose-Capillary 246 (H) 65 - 99 mg/dL   Comment 1 Notify RN     Assessment/Plan: 1. Type 2 diabetes mellitus with diabetic neuropathy, with long-term current use of insulin (HCC) Metformin refilled. At end of visit, patient noted inability to afford the [place]. Patient currently on Invokana but due to recent studies showing increased risk of amputation, we are trying to switch to another medication in the same class. Formulary reviewed. They will allow Jardiance. Will begin at low dose. Will repeat A1C today. FU scheduled. - metFORMIN (GLUCOPHAGE) 1000 MG tablet; Take 1 tablet (1,000 mg total) by mouth 2 (two) times daily with a meal.  Dispense: 180 tablet; Refill: 3 - Comp Met (CMET) - Hemoglobin A1c  2. Other acute appendicitis S/p open appendectomy. Patient doing very well overall. No evidence of wound infection. Has follow-up scheduled with wound care/surgery. Wife to continue dressing changes as directed by surgeon. Will check CBC, CMP today. Alarm signs/symptoms discussed with patient that would prompt ER assessment. Patient voices understanding and agreement with plan. - CBC - Comp Met (CMET)  3. Mass of foot, right No evidence of phlebitis or thrombophlebitis. Will obtain US to further assess. - [place] Extrem Low Right Ltd; Future   Leeanne Rio, PA-C

## 2016-07-02 NOTE — Patient Instructions (Addendum)
Please go to the lab for blood work. Then go downstairs to schedule Ultrasound. Stay hydrated and eat a well-balanced diet. Continue Medications as directed for now with the following exception: Increase lantus by 2 units. I will call once Steve [name] is approved.  Please call Dr. Dois Davenport office to verify your follow-up appointment.  If you note any difficulty with bowels or any increase pain or noted drainage, swelling, fever, return immediately or go to the ER.

## 2016-07-02 NOTE — Progress Notes (Signed)
Pre visit review using our clinic review tool, if applicable. No additional management support is needed unless otherwise documented below in the visit note/SLS  

## 2016-07-04 ENCOUNTER — Telehealth: Payer: Self-pay | Admitting: *Deleted

## 2016-07-04 MED ORDER — EMPAGLIFLOZIN 10 MG PO TABS: 10.0000 mg | ORAL_TABLET | Freq: Every day | ORAL | 2 refills | Status: DC

## 2016-07-04 NOTE — Telephone Encounter (Signed)
Completed and faxed PA form to [facility] for Coverage of Farxiga; received Denial paperwork, stating patient must try & fail Jardiance. Per provider VO, patient will start Jardiance 10 mg once daily, new Rx to pharmacy; patient informed, understood & agreed, will F/U in one month/SLS 09/22

## 2016-07-05 ENCOUNTER — Ambulatory Visit (HOSPITAL_BASED_OUTPATIENT_CLINIC_OR_DEPARTMENT_OTHER)
Admission: RE | Admit: 2016-07-05 | Discharge: 2016-07-05 | Disposition: A | Discharge status: Home / Self Care | Payer: BLUE CROSS/BLUE SHIELD | Source: Ambulatory Visit | Attending: Physician Assistant | Admitting: Physician Assistant

## 2016-07-05 DIAGNOSIS — R2241 Localized swelling, mass and lump, right lower limb: Secondary | ICD-10-CM | POA: Insufficient documentation

## 2016-07-08 ENCOUNTER — Other Ambulatory Visit: Payer: Self-pay | Admitting: Physician Assistant

## 2016-07-08 DIAGNOSIS — M6780 Other specified disorders of synovium and tendon, unspecified site: Secondary | ICD-10-CM

## 2016-07-14 ENCOUNTER — Ambulatory Visit (INDEPENDENT_AMBULATORY_CARE_PROVIDER_SITE_OTHER): Payer: BLUE CROSS/BLUE SHIELD | Admitting: Podiatry

## 2016-07-14 ENCOUNTER — Ambulatory Visit (INDEPENDENT_AMBULATORY_CARE_PROVIDER_SITE_OTHER): Payer: BLUE CROSS/BLUE SHIELD

## 2016-07-14 ENCOUNTER — Encounter: Payer: Self-pay | Admitting: Podiatry

## 2016-07-14 DIAGNOSIS — R52 Pain, unspecified: Secondary | ICD-10-CM

## 2016-07-14 DIAGNOSIS — M6740 Ganglion, unspecified site: Secondary | ICD-10-CM | POA: Diagnosis not present

## 2016-07-15 ENCOUNTER — Telehealth: Payer: Self-pay | Admitting: *Deleted

## 2016-07-15 NOTE — Telephone Encounter (Signed)
[name] called again states there is no tissue in the syringe only fluid, does Dr. Jacqualyn Posey want Fine Needle Aspiration with cytology.  Dr. Berton Lan, and I informed Steve [name].

## 2016-07-15 NOTE — Telephone Encounter (Signed)
Steve [name] asked if the specimen sent yesterday was for ganglion cyst.  I reviewed LOV notes and Dr. Jacqualyn Posey diagnosed as ganglion cyst. Denton Ar asked if routine cyst biopsy would be correct and I okayed.

## 2016-07-17 NOTE — Progress Notes (Signed)
Subjective: 55 year old male presents the also concerns of a knot on the top of his right foot just in front of the ankle joint which is been ongoing the last several months. Denies any recent injury or trauma. Areas about the same size compared to what it has been. Denies any recent injury or trauma. No redness or any skin changes to the area. No previous treatment. Denies any systemic complaints such as fevers, chills, nausea, vomiting. No acute changes since last appointment, and no other complaints at this time.   Objective: AAO x3, NAD DP/PT pulses palpable bilaterally, CRT less than 3 seconds On the anterior aspect of the right ankle is what appears to be a fluid-filled cyst-type lesion. There is no one skin changes or erythema. There is minimal to palpation to the area. Subjectively only hurts with pressure in certain shoes. No other areas of tenderness bilaterally. No other lesions identified. No open lesions or pre-ulcerative lesions.  No pain with calf compression, swelling, warmth, erythema  Assessment: Right ankle likely ganglion cyst  Plan: -All treatment options discussed with the patient including all alternatives, risks, complications.  -X-rays are pending reviewed. No foreign body. No evidence acute fracture. -At this time a discussed with an aspiration of the cyst and he wishes to proceed understanding risks and complications and gave consent. Under sterile conditions a mixture of lidocaine Marcaine mixture was infiltrated in a regional block fashion around the cyst. Once anesthetized the skin was prepped in sterile fashion. An 18-gauge needle was utilized to aspirate the cyst and a small amount of clear yellow type fluid was expressed. This was sent for pathology. The compression bandage was then applied. He tolerated this well any complications. -Follow-up with symptoms not resolved or after receiving the results of the pathology and/or cell count (will contact the lab to see  what is best) -Patient encouraged to call the office with any questions, concerns, change in symptoms.   Celesta Gentile, DPM

## 2016-08-11 ENCOUNTER — Encounter: Payer: Self-pay | Admitting: Podiatry

## 2016-08-11 ENCOUNTER — Ambulatory Visit (INDEPENDENT_AMBULATORY_CARE_PROVIDER_SITE_OTHER): Payer: BLUE CROSS/BLUE SHIELD | Admitting: Podiatry

## 2016-08-11 DIAGNOSIS — M799 Soft tissue disorder, unspecified: Secondary | ICD-10-CM

## 2016-08-11 DIAGNOSIS — Z01812 Encounter for preprocedural laboratory examination: Secondary | ICD-10-CM

## 2016-08-11 DIAGNOSIS — M7989 Other specified soft tissue disorders: Secondary | ICD-10-CM

## 2016-08-12 NOTE — Progress Notes (Signed)
Subjective: 55 year old male presents the office they for follow-up evaluation of a cyst of the right foot/ankle. He states that after the areas draining less when the area got bigger and painful and is not the same size as what was. He denies any recent injury or trauma. Denies any overlying redness or warmth. Areas painful pressure in shoes. Denies any systemic complaints such as fevers, chills, nausea, vomiting. No acute changes since last appointment, and no other complaints at this time.   Objective: AAO x3, NAD DP/PT pulses palpable bilaterally, CRT less than 3 seconds On the anterior aspect the distal ankle, dorsal foot is a firm soft tissue mass present on the medial aspect which appears to be overlying the extensor tendon. This is not mobile. There is tenderness palpation of the area. No other lesions identified. No pain with ankle or subtalar joint range of motion.  No open lesions or pre-ulcerative lesions.  No pain with calf compression, swelling, warmth, erythema  Assessment: Soft tissue mass right foot   Plan: -All treatment options discussed with the patient including all alternatives, risks, complications.  -At this time given the recurrence of the mass discussed with him surgical excision of the mass however we will obtain the MRI prior to this for surgical planning. This is ordered today with contrast. However discussed with him better A1c control before surgical intervention. -Patient encouraged to call the office with any questions, concerns, change in symptoms.   Celesta Gentile, DPM

## 2016-08-14 ENCOUNTER — Telehealth: Payer: Self-pay | Admitting: *Deleted

## 2016-08-14 NOTE — Telephone Encounter (Signed)
He's scheduled for a MRI right ankle with and without contrast on November 5th.  It looks like with his insurance, [employer], authorization is required.

## 2016-08-15 NOTE — Telephone Encounter (Signed)
"  We haven't received authorization for his MRI from [facility].  We will have to cancel it if we do not receive it by 3:30pm."  I faxed the authorization to [place].  Authorization number is order number [id number] authorized from 08/15/2016 to 09/13/2016.  I called and left patient a message that MRI was authorized.

## 2016-08-17 ENCOUNTER — Inpatient Hospital Stay: Admission: RE | Admit: 2016-08-17 | Payer: BLUE CROSS/BLUE SHIELD | Source: Ambulatory Visit

## 2016-08-21 ENCOUNTER — Telehealth: Payer: Self-pay | Admitting: *Deleted

## 2016-08-21 NOTE — Telephone Encounter (Signed)
I attempted to call patient to inform him his MRI was authorized by [employer].  He can reschedule his MRI.  It was authorized from 08/15/2016 to 09/13/2016.  I left him a message to call me back.  I also called and informed Dorian Pod at [place].  She said she would get the schedulers to call and get him back on the schedule.

## 2016-08-30 ENCOUNTER — Encounter: Payer: Self-pay | Admitting: Family Medicine

## 2016-08-30 ENCOUNTER — Ambulatory Visit (INDEPENDENT_AMBULATORY_CARE_PROVIDER_SITE_OTHER): Payer: BLUE CROSS/BLUE SHIELD | Admitting: Family Medicine

## 2016-08-30 VITALS — BP 120/72 | HR 89 | Temp 97.8°F | Resp 18 | Ht 76.0 in | Wt 264.5 lb

## 2016-08-30 DIAGNOSIS — L03032 Cellulitis of left toe: Secondary | ICD-10-CM

## 2016-08-30 MED ORDER — DOXYCYCLINE HYCLATE 100 MG PO TABS: 100.0000 mg | ORAL_TABLET | Freq: Two times a day (BID) | ORAL | 0 refills | Status: DC

## 2016-08-30 NOTE — Progress Notes (Signed)
   Subjective:    Patient ID: Steve [name], male    DOB: [birth year], 56 y.o.   MRN: [id number]  HPI L toe swelling- pt cut nails ~4 days ago and 'cut it too short'.  Now having pain, redness, and swelling of L 5th toe.  Some mild drainage from tip of nail last night.  No hx of similar.  Pt is diabetic.   Review of Systems For ROS see HPI     Objective:   Physical Exam  Constitutional: He is oriented to person, place, and time. He appears well-developed and well-nourished. No distress.  Musculoskeletal: He exhibits tenderness (TTP over L 5th toe).  Neurological: He is alert and oriented to person, place, and time.  Skin: Skin is warm and dry. There is erythema (L 5th toe erythematous, warm, swollen).  Almost complete removal of nail on L 5th toe  Vitals reviewed.         Assessment & Plan:  Cellulitis- pt w/ infected 5th toe after removing almost his entire nail while clipping them 4 days ago.  Start Doxy.  Peroxide soaks and then pat dry.  Reviewed nail/foot hygiene for diabetes.  If no improvement, pt to notify PCP.  Reviewed supportive care and red flags that should prompt return.  Pt expressed understanding and is in agreement w/ plan.

## 2016-08-30 NOTE — Patient Instructions (Signed)
Follow up as needed- particularly if worsening or not improving Start the Doxycycline twice daily- take w/ food Soak toe in peroxide at least once daily- make sure you pat dry completely and then cover with a clean sock If no improvement or worsening, please call! Hang in there!!!

## 2016-08-30 NOTE — Progress Notes (Signed)
Pre-visit discussion using our clinic review tool. No additional management support is needed unless otherwise documented below in the visit note.  

## 2016-08-31 ENCOUNTER — Ambulatory Visit
Admission: RE | Admit: 2016-08-31 | Discharge: 2016-08-31 | Disposition: A | Discharge status: Home / Self Care | Payer: BLUE CROSS/BLUE SHIELD | Source: Ambulatory Visit | Attending: Podiatry | Admitting: Podiatry

## 2016-08-31 MED ORDER — GADOBENATE DIMEGLUMINE 529 MG/ML IV SOLN
20.0000 mL | Freq: Once | INTRAVENOUS | Status: AC | PRN
  Administered 2016-08-31: 20 mL via INTRAVENOUS

## 2016-09-01 ENCOUNTER — Telehealth: Payer: Self-pay | Admitting: Emergency Medicine

## 2016-09-01 ENCOUNTER — Other Ambulatory Visit: Payer: Self-pay | Admitting: Physician Assistant

## 2016-09-01 NOTE — Telephone Encounter (Signed)
Ok with me 

## 2016-09-01 NOTE — Telephone Encounter (Signed)
I see that this message was routed to me, I'm happy to except if he's okay with a nurse practitioner.

## 2016-09-01 NOTE — Telephone Encounter (Signed)
I am sorry but I am not currently accepting new patients.

## 2016-09-01 NOTE — Telephone Encounter (Signed)
Patient should be off of this medication since July at the latest. Should currently be on Jardiance and Insulin. Please call patient to assess if he has been taking Onglyza and for how long. If he has been taking all along, ok to refill. Needs follow-up with me in the next couple of weeks.

## 2016-09-01 NOTE — Telephone Encounter (Signed)
Spoke with patient and he has been taking the Onglyza for the last 2 months. He verifies he is taking Jardiance, Metformin, Onglyza and Insulin. Refill sent to the pharmacy

## 2016-09-01 NOTE — Telephone Encounter (Signed)
Spoke with patient concerning amount of medication. Stopped the Onglyza. Continue Metformin and Jardiance for now. Discussed titration schedule for the Lantus. He will call in a few days with sugar levels so we can discussed a long-term titration schedule until next appointment.

## 2016-09-01 NOTE — Telephone Encounter (Signed)
Patient wanted to switch providers from [facility] Aquas to Dr Deborra Medina. [facility] location is closer for him.

## 2016-09-10 NOTE — Telephone Encounter (Signed)
Can you contact patient and get scheduled with new office. We have been playing phone tag for which provider he and his wife

## 2016-09-11 ENCOUNTER — Encounter: Payer: Self-pay | Admitting: Podiatry

## 2016-09-11 ENCOUNTER — Ambulatory Visit (INDEPENDENT_AMBULATORY_CARE_PROVIDER_SITE_OTHER): Payer: BLUE CROSS/BLUE SHIELD | Admitting: Podiatry

## 2016-09-11 DIAGNOSIS — M799 Soft tissue disorder, unspecified: Secondary | ICD-10-CM | POA: Diagnosis not present

## 2016-09-11 DIAGNOSIS — M659 Synovitis and tenosynovitis, unspecified: Secondary | ICD-10-CM

## 2016-09-11 DIAGNOSIS — M7989 Other specified soft tissue disorders: Secondary | ICD-10-CM

## 2016-09-11 NOTE — Telephone Encounter (Signed)
Spoke with pt, he asked that I call wife to find out which location and pcp he was to see, LM for wife to call back.

## 2016-09-15 ENCOUNTER — Ambulatory Visit: Payer: BLUE CROSS/BLUE SHIELD | Admitting: Physician Assistant

## 2016-09-16 NOTE — Progress Notes (Signed)
Subjective: 55 year old male presents the office today for Evaluation of right foot soft tissue mass. He states it still hurts on the shot did not help. He presents a discussed MRI results. He denies any recent injury or trauma. No significant increase in swelling there is no redness or warmth. Areas painful with pressure. Denies any systemic complaints such as fevers, chills, nausea, vomiting. No acute changes since last appointment, and no other complaints at this time.   Objective: AAO x3, NAD DP/PT pulses palpable bilaterally, CRT less than 3 seconds On the anterior aspect the medial distal ankle on the course of the extensor tendon is a firm soft tissue mass. This is non-mobile. There is no fluctuance or crepitus. There is no overlying erythema or increase in warmth. There is mild to palpation. No open lesions or pre-ulcerative lesions.  No pain with calf compression, swelling, warmth, erythema  Assessment: Soft tissue mass left foot  Plan: -All treatment options discussed with the patient including all alternatives, risks, complications.  -MRI results were discussed with the patient. Discussed with him repair the tendon/debridement and removal of soft tissue mass however given elevate A1c we will hold off on this. Given the MRI does show thinning of the tendon on the tibialis anterior we will place in a ankle brace. Also topical anti-inflammatory was ordered through [facility]. Ice to the area. He does not want to wear the brace that we have and I recommended over-the-counter elastic-type brace to help with swelling as well as support. -Patient encouraged to call the office with any questions, concerns, change in symptoms.   Celesta Gentile, DPM  IMPRESSION Tenosynovitis of the tibialis anterior tendon with a lesion in the sheath accounts for the patient's palpated abnormality. Lesion within the sheath likely reflects hypertrophied synovium which may be partially calcified. Additional  differential considerations include markedly degenerated and hypertrophied tendon. Giant cell tumor of the tendon sheath is also possible but somewhat less likely given lack of enhancement.  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  Degenerative change about the articulation of the distal fibula and talus.

## 2016-10-23 ENCOUNTER — Ambulatory Visit: Payer: BLUE CROSS/BLUE SHIELD | Admitting: Podiatry

## 2016-12-26 ENCOUNTER — Other Ambulatory Visit: Payer: Self-pay | Admitting: Family Medicine

## 2016-12-26 DIAGNOSIS — I739 Peripheral vascular disease, unspecified: Secondary | ICD-10-CM

## 2017-03-15 IMAGING — CR DG HAND COMPLETE 3+V*R*
3 series · 3 of 3 positions shown · non-contrast
Comparison: None.

CLINICAL DATA: Cut hand with glass. Injury between the thumb and
index finger.

EXAM:
RIGHT HAND - COMPLETE 3+ VIEW

[hand obl]
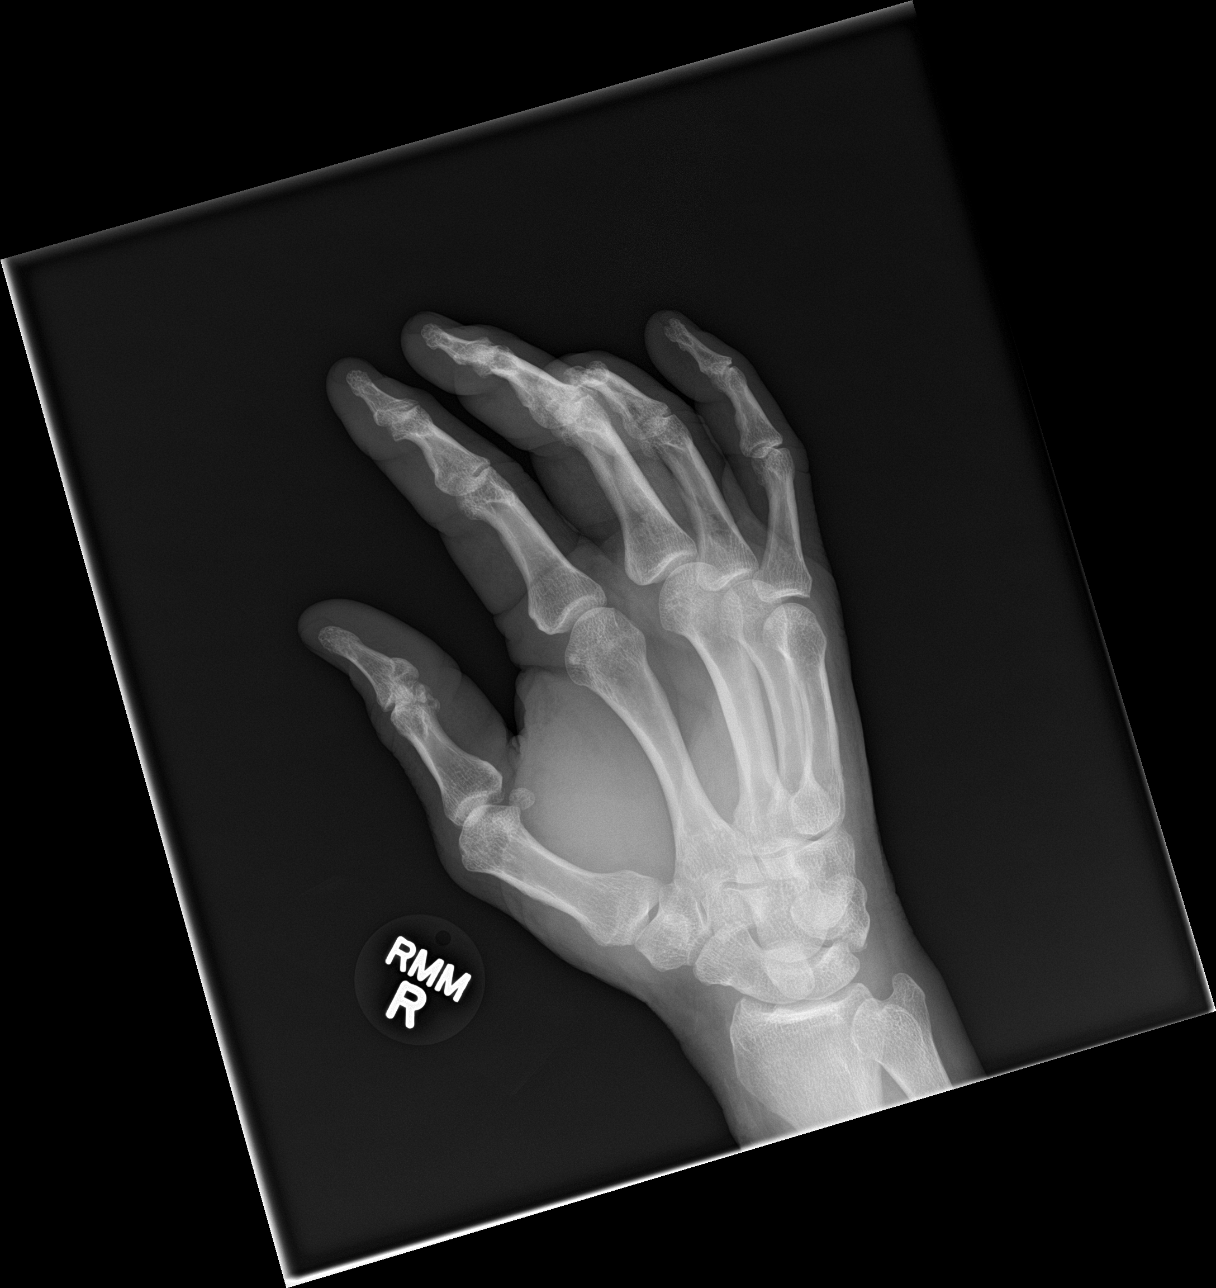

[hand lat]
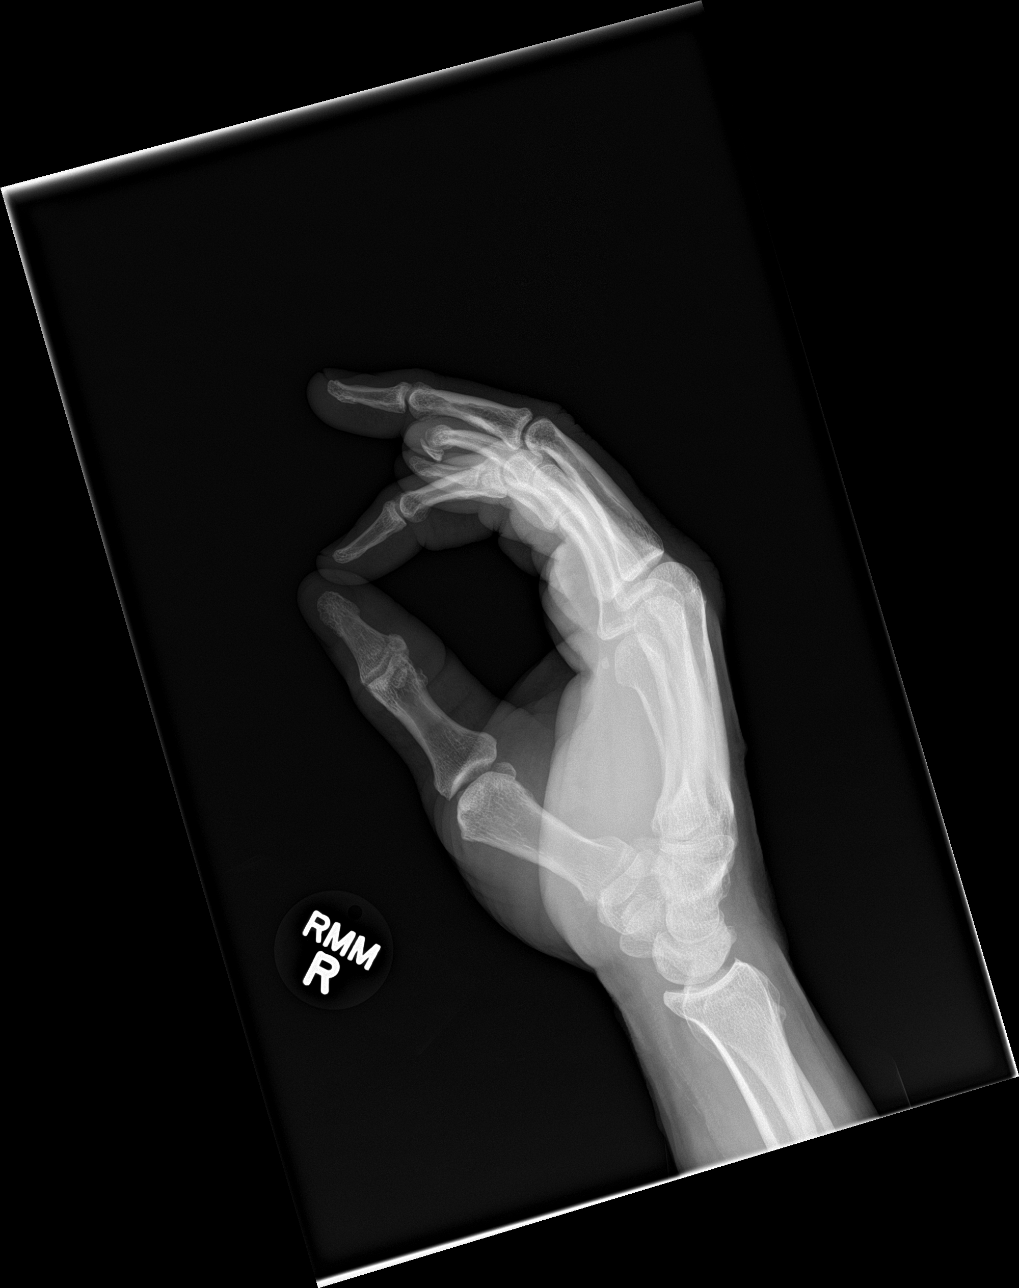

[hand pa]
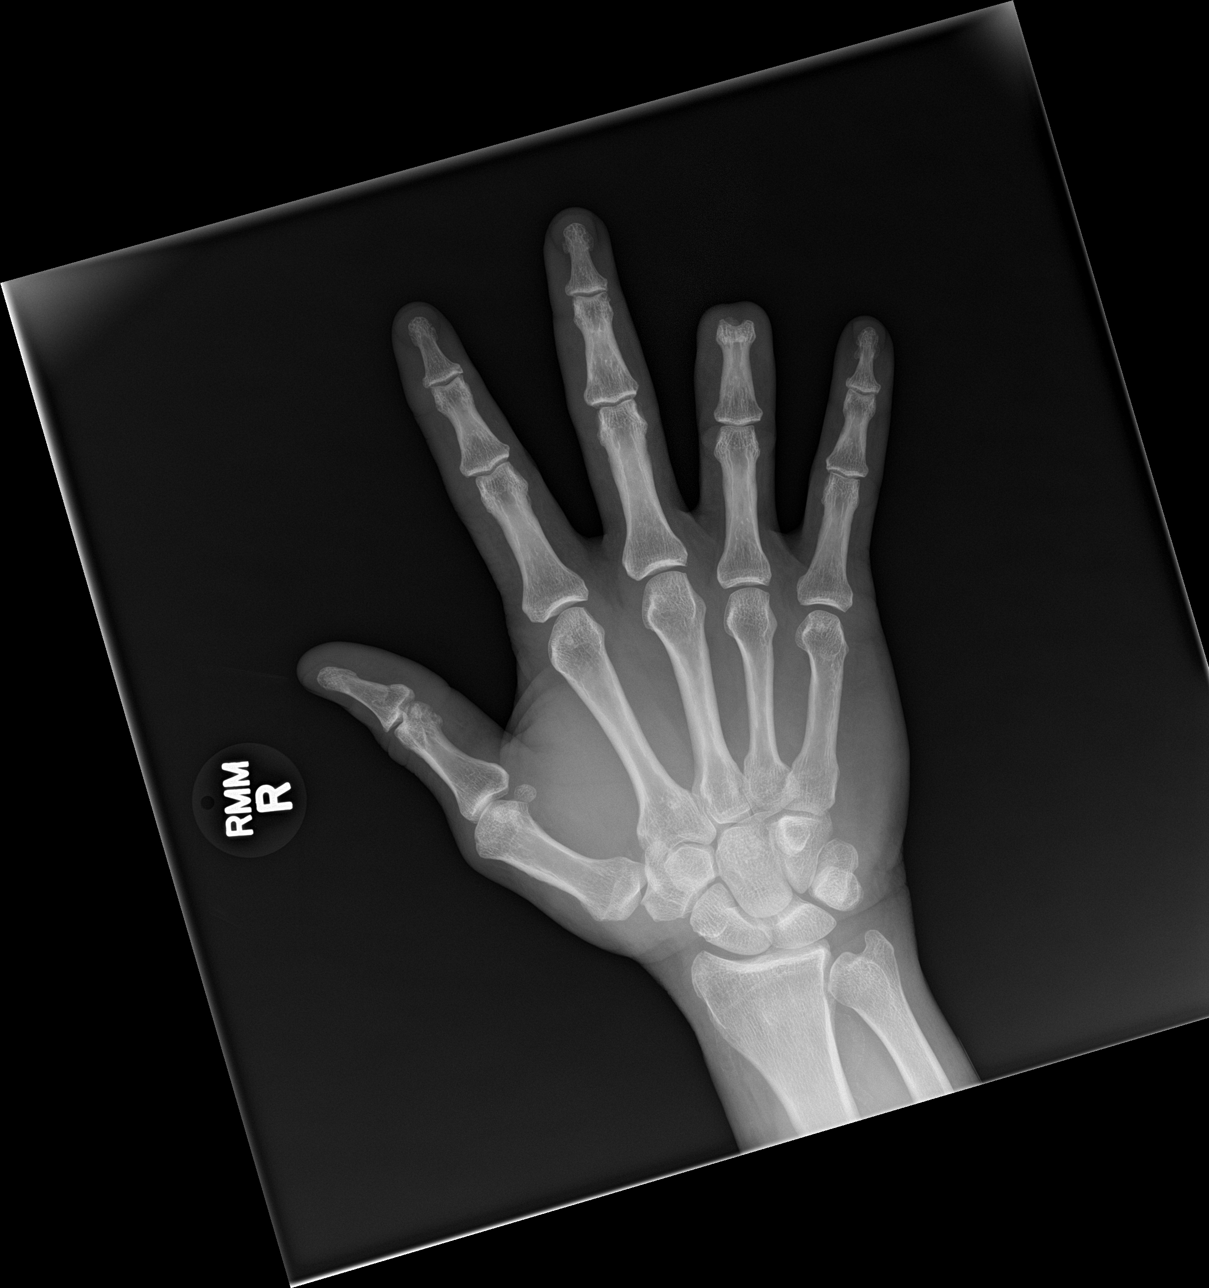

[3 of 3 positions shown; findings below may reference images not displayed]

FINDINGS: Prior amputation to the index finger distal phalanx. No evidence for
radiopaque foreign bodies in the soft tissues. Negative for an acute
fracture or dislocation.
IMPRESSION: No acute bone abnormality in the right hand.

No evidence for a radiopaque foreign body.

## 2017-03-20 ENCOUNTER — Encounter: Payer: Self-pay | Admitting: *Deleted

## 2017-04-08 ENCOUNTER — Encounter: Payer: Self-pay | Admitting: Internal Medicine

## 2017-07-07 IMAGING — DX DG FOOT COMPLETE 3+V*R*
3 series · 3 of 3 positions shown · non-contrast
Comparison: None.

CLINICAL DATA: Right-sided for pain and heel pain for 2 weeks.
Injury yesterday with pain and swelling of first toe. Initial
encounter.

EXAM:
RIGHT FOOT COMPLETE - 3+ VIEW

[foot ap]
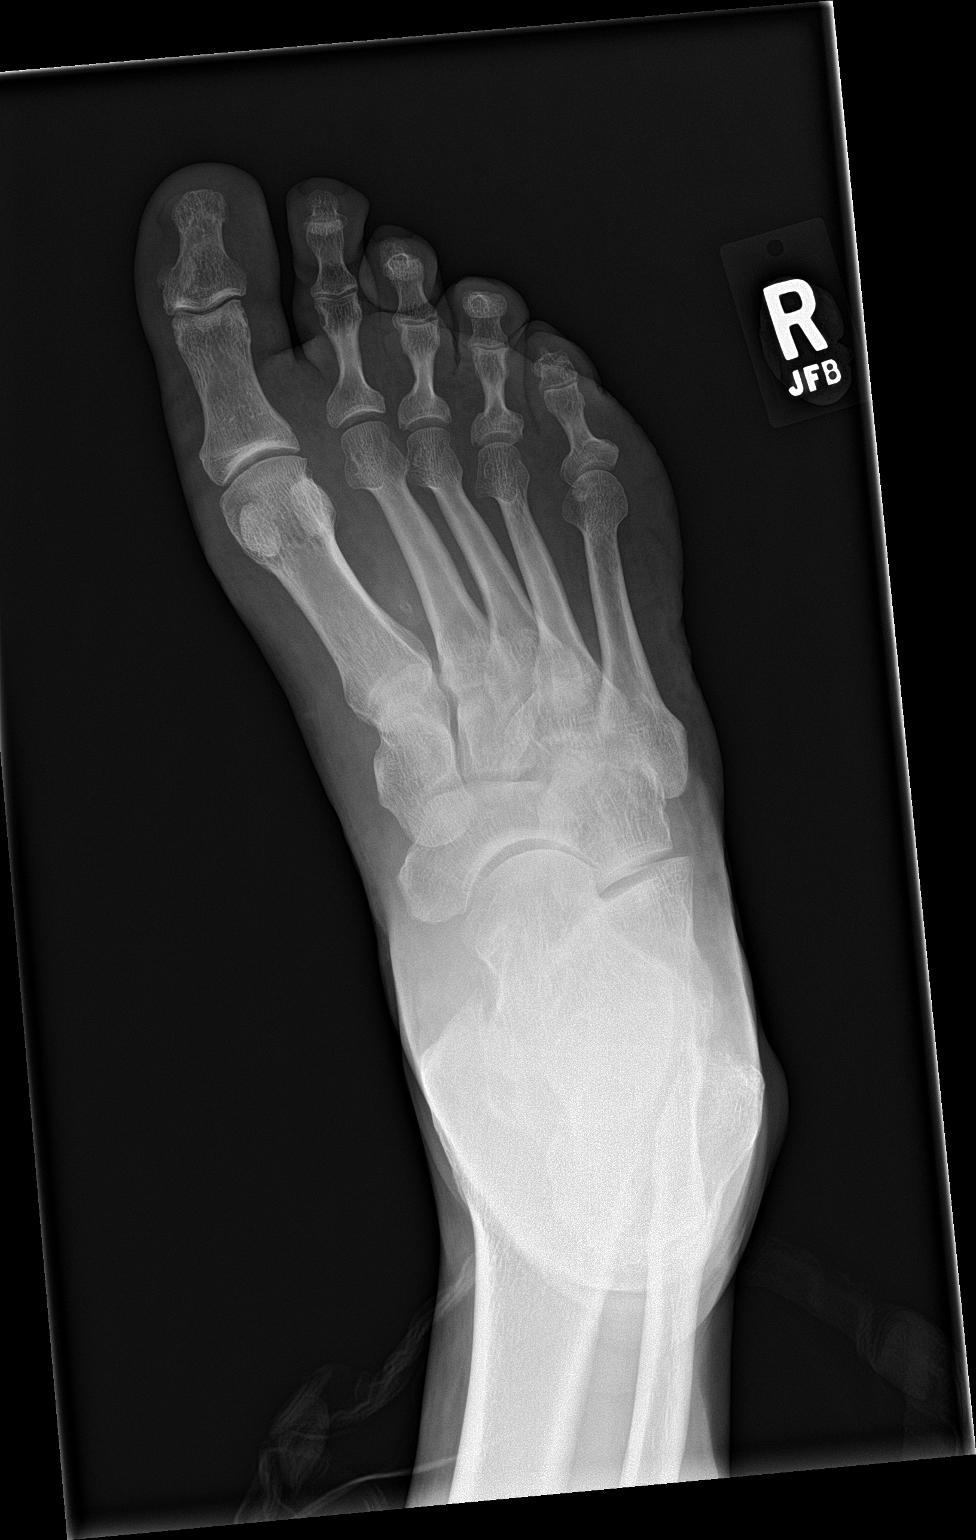

[foot obl]
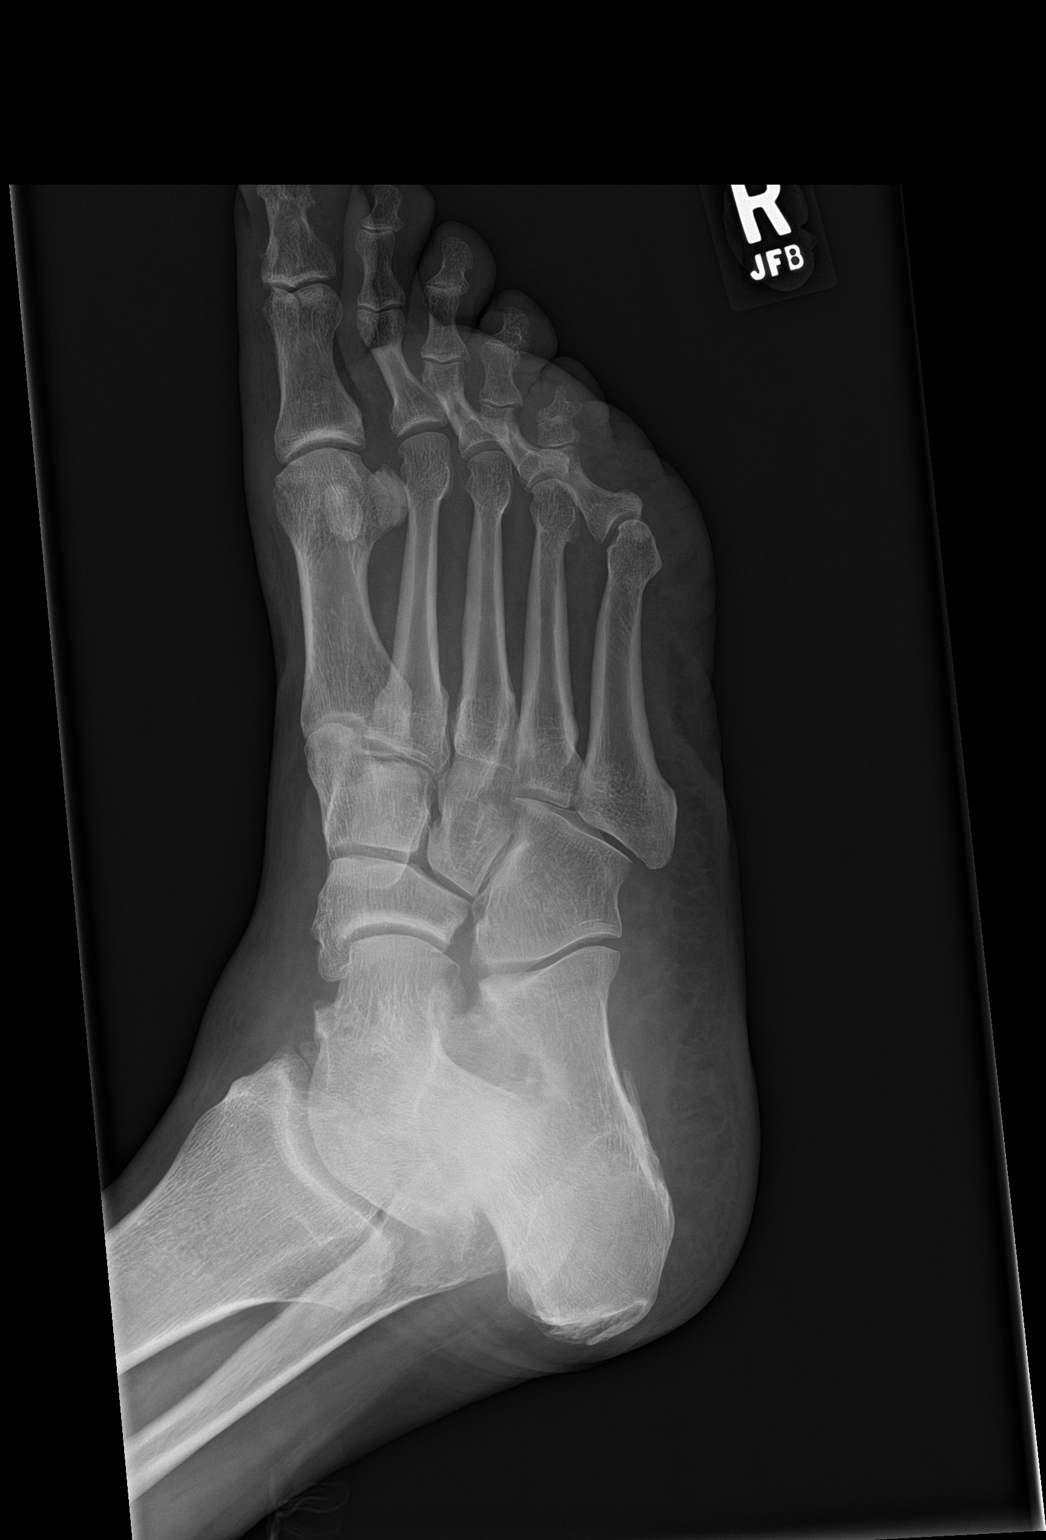

[foot lat]
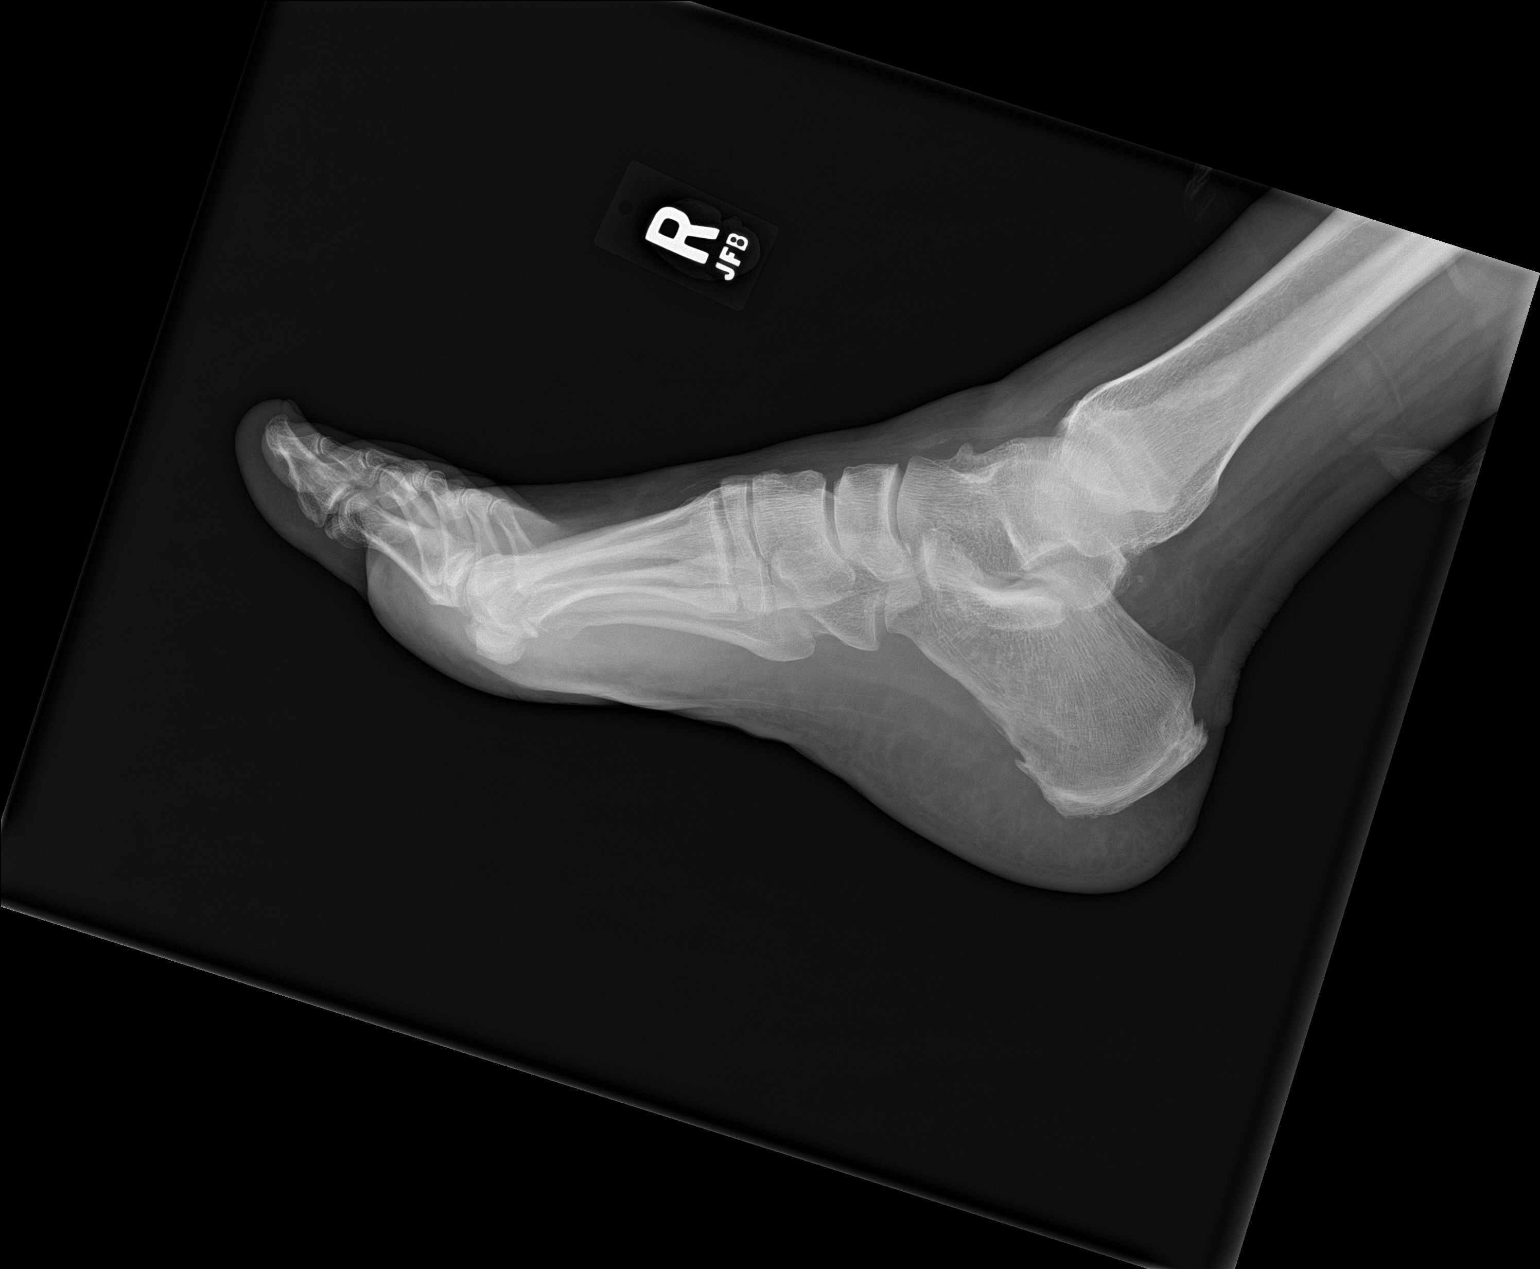

[3 of 3 positions shown; findings below may reference images not displayed]

FINDINGS: No acute fracture or dislocation identified. Soft tissues are
unremarkable. No arthropathy or bony lesions. Vascular
calcifications present
IMPRESSION: Negative.

## 2017-08-21 ENCOUNTER — Other Ambulatory Visit: Payer: Self-pay | Admitting: Physician Assistant

## 2017-08-21 ENCOUNTER — Ambulatory Visit
Admission: RE | Admit: 2017-08-21 | Discharge: 2017-08-21 | Disposition: A | Discharge status: Home / Self Care | Payer: BLUE CROSS/BLUE SHIELD | Source: Ambulatory Visit | Attending: Physician Assistant | Admitting: Physician Assistant

## 2017-08-21 DIAGNOSIS — R05 Cough: Secondary | ICD-10-CM

## 2017-08-21 DIAGNOSIS — R059 Cough, unspecified: Secondary | ICD-10-CM

## 2017-08-23 ENCOUNTER — Other Ambulatory Visit: Payer: Self-pay | Admitting: Family Medicine

## 2017-08-23 DIAGNOSIS — Z794 Long term (current) use of insulin: Principal | ICD-10-CM

## 2017-08-23 DIAGNOSIS — E1140 Type 2 diabetes mellitus with diabetic neuropathy, unspecified: Secondary | ICD-10-CM

## 2017-11-03 ENCOUNTER — Telehealth: Payer: Self-pay | Admitting: Physician Assistant

## 2017-11-03 NOTE — Telephone Encounter (Signed)
From front office RX Bin: Patient My Saving RX Card was mailed out to 11/03/2017 to the address on file.

## 2017-11-11 ENCOUNTER — Encounter: Payer: Self-pay | Admitting: Internal Medicine

## 2017-12-29 ENCOUNTER — Other Ambulatory Visit: Payer: Self-pay

## 2017-12-29 ENCOUNTER — Encounter: Payer: Self-pay | Admitting: Internal Medicine

## 2017-12-29 ENCOUNTER — Ambulatory Visit (AMBULATORY_SURGERY_CENTER): Payer: Self-pay | Admitting: *Deleted

## 2017-12-29 VITALS — Ht 76.0 in | Wt 259.9 lb

## 2017-12-29 DIAGNOSIS — Z860100 Personal history of colon polyps, unspecified: Secondary | ICD-10-CM

## 2017-12-29 NOTE — Progress Notes (Signed)
No egg or soy allergy  No home oxygen used and does wear a CPAP for sleep apnea  No diet medications given  Pt does have trouble with general anesthesia- "can weak up violent", but had no trouble with MAC for last procedure.  No intubation problems per pt  Registered in [place] sample given

## 2018-01-12 ENCOUNTER — Encounter: Payer: Self-pay | Admitting: Internal Medicine

## 2018-01-12 ENCOUNTER — Ambulatory Visit (AMBULATORY_SURGERY_CENTER): Payer: BLUE CROSS/BLUE SHIELD | Admitting: Internal Medicine

## 2018-01-12 ENCOUNTER — Other Ambulatory Visit: Payer: Self-pay

## 2018-01-12 VITALS — BP 129/75 | HR 66 | Temp 97.1°F | Resp 14 | Ht 76.0 in | Wt 260.0 lb

## 2018-01-12 DIAGNOSIS — Z86010 Personal history of colonic polyps: Secondary | ICD-10-CM | POA: Diagnosis not present

## 2018-01-12 DIAGNOSIS — D123 Benign neoplasm of transverse colon: Secondary | ICD-10-CM

## 2018-01-12 MED ORDER — SODIUM CHLORIDE 0.9 % IV SOLN: 500.0000 mL | Freq: Once | INTRAVENOUS | Status: AC

## 2018-01-12 NOTE — Progress Notes (Signed)
Called to room to assist during endoscopic procedure.  Patient ID and intended procedure confirmed with present staff. Received instructions for my participation in the procedure from the performing physician.  

## 2018-01-12 NOTE — Patient Instructions (Signed)
YOU HAD AN ENDOSCOPIC PROCEDURE TODAY AT [facility]:   Refer to the procedure report that was given to you for any specific questions about what was found during the examination.  If the procedure report does not answer your questions, please call your gastroenterologist to clarify.  If you requested that your care partner not be given the details of your procedure findings, then the procedure report has been included in a sealed envelope for you to review at your convenience later.  YOU SHOULD EXPECT: Some feelings of bloating in the abdomen. Passage of more gas than usual.  Walking can help get rid of the air that was put into your GI tract during the procedure and reduce the bloating. If you had a lower endoscopy (such as a colonoscopy or flexible sigmoidoscopy) you may notice spotting of blood in your stool or on the toilet paper. If you underwent a bowel prep for your procedure, you may not have a normal bowel movement for a few days.  Please Note:  You might notice some irritation and congestion in your nose or some drainage.  This is from the oxygen used during your procedure.  There is no need for concern and it should clear up in a day or so.  SYMPTOMS TO REPORT IMMEDIATELY:   Following lower endoscopy (colonoscopy or flexible sigmoidoscopy):  Excessive amounts of blood in the stool  Significant tenderness or worsening of abdominal pains  Swelling of the abdomen that is new, acute  Fever of 100F or higher  Please see handouts given to you on polyps and Hemorrhoids.  For urgent or emergent issues, a gastroenterologist can be reached at any hour by calling [phone].   DIET:  We do recommend a small meal at first, but then you may proceed to your regular diet.  Drink plenty of fluids but you should avoid alcoholic beverages for 24 hours.  ACTIVITY:  You should plan to take it easy for the rest of today and you should NOT DRIVE or use heavy machinery until tomorrow  (because of the sedation medicines used during the test).    FOLLOW UP: Our staff will call the number listed on your records the next business day following your procedure to check on you and address any questions or concerns that you may have regarding the information given to you following your procedure. If we do not reach you, we will leave a message.  However, if you are feeling well and you are not experiencing any problems, there is no need to return our call.  We will assume that you have returned to your regular daily activities without incident.  If any biopsies were taken you will be contacted by phone or by letter within the next 1-3 weeks.  Please call us at [phone] if you have not heard about the biopsies in 3 weeks.    SIGNATURES/CONFIDENTIALITY: You and/or your care partner have signed paperwork which will be entered into your electronic medical record.  These signatures attest to the fact that that the information above on your After Visit Summary has been reviewed and is understood.  Full responsibility of the confidentiality of this discharge information lies with you and/or your care-partner.  Thank you for letting us take care of your healthcare needs today.

## 2018-01-12 NOTE — Progress Notes (Signed)
Pt's states no medical or surgical changes since previsit or office visit. 

## 2018-01-12 NOTE — Progress Notes (Signed)
Spontaneous respirations throughout. VSS. Resting comfortably. To PACU on room air. Report to  RN. 

## 2018-01-12 NOTE — Op Note (Signed)
[facility] Patient Name: Steve [name] Procedure Date: 01/12/2018 1:31 PM MRN: [id number] Endoscopist: Jerene Bears , MD Age: 57 Referring MD:  Date of Birth: 1960/12/26 Gender: Male Account #: [id number] Procedure:                Colonoscopy Indications:              Surveillance: Personal history of adenomatous                            polyps on last colonoscopy 3 years ago Medicines:                Monitored Anesthesia Care Procedure:                Pre-Anesthesia Assessment:                           - Prior to the procedure, a History and Physical                            was performed, and patient medications and                            allergies were reviewed. The patient's tolerance of                            previous anesthesia was also reviewed. The risks                            and benefits of the procedure and the sedation                            options and risks were discussed with the patient.                            All questions were answered, and informed consent                            was obtained. Prior Anticoagulants: The patient has                            taken no previous anticoagulant or antiplatelet                            agents. ASA Grade Assessment: II - A patient with                            mild systemic disease. After reviewing the risks                            and benefits, the patient was deemed in                            satisfactory condition to undergo the procedure.  After obtaining informed consent, the colonoscope                            was passed under direct vision. Throughout the                            procedure, the patient's blood pressure, pulse, and                            oxygen saturations were monitored continuously. The                            Colonoscope was introduced through the anus and                            advanced to the the cecum,  identified by                            appendiceal orifice and ileocecal valve. The                            colonoscopy was performed without difficulty. The                            patient tolerated the procedure well. The quality                            of the bowel preparation was good. The ileocecal                            valve, appendiceal orifice, and rectum were                            photographed. Scope In: 1:43:21 PM Scope Out: 1:58:06 PM Scope Withdrawal Time: 0 hours 11 minutes 8 seconds  Total Procedure Duration: 0 hours 14 minutes 45 seconds  Findings:                 The digital rectal exam was normal.                           Three sessile polyps were found in the transverse                            colon. The polyps were 3 to 5 mm in size. These                            polyps were removed with a cold snare. Resection                            and retrieval were complete.                           Internal hemorrhoids were found during  retroflexion. The hemorrhoids were small. Complications:            No immediate complications. Estimated Blood Loss:     Estimated blood loss was minimal. Impression:               - Three 3 to 5 mm polyps in the transverse colon,                            removed with a cold snare. Resected and retrieved.                           - Small internal hemorrhoids. Recommendation:           - Patient has a contact number available for                            emergencies. The signs and symptoms of potential                            delayed complications were discussed with the                            patient. Return to normal activities tomorrow.                            Written discharge instructions were provided to the                            patient.                           - Resume previous diet.                           - Continue present medications.                            - Await pathology results.                           - Repeat colonoscopy is recommended for                            surveillance. The colonoscopy date will be                            determined after pathology results from today's                            exam become available for review. Jerene Bears, MD 01/12/2018 2:02:13 PM This report has been signed electronically.

## 2018-01-13 ENCOUNTER — Telehealth: Payer: Self-pay

## 2018-01-13 NOTE — Telephone Encounter (Signed)
  Follow up Call-  Call back number 01/12/2018  Post procedure Call Back phone  # [phone]  Permission to leave phone message Yes  Some recent data might be hidden     Patient questions:  Do you have a fever, pain , or abdominal swelling? No. Pain Score  0 *  Have you tolerated food without any problems? Yes.    Have you been able to return to your normal activities? Yes.    Do you have any questions about your discharge instructions: Diet   No. Medications  No. Follow up visit  No.  Do you have questions or concerns about your Care? No.  Actions: * If pain score is 4 or above: No action needed, pain <4.

## 2018-01-19 ENCOUNTER — Encounter: Payer: Self-pay | Admitting: Internal Medicine

## 2018-10-25 ENCOUNTER — Other Ambulatory Visit: Payer: Self-pay | Admitting: Orthopedic Surgery

## 2018-10-25 ENCOUNTER — Other Ambulatory Visit (HOSPITAL_COMMUNITY): Payer: Self-pay | Admitting: Orthopedic Surgery

## 2018-10-25 DIAGNOSIS — T84033A Mechanical loosening of internal left knee prosthetic joint, initial encounter: Secondary | ICD-10-CM

## 2018-11-01 ENCOUNTER — Ambulatory Visit (HOSPITAL_COMMUNITY)
Admission: RE | Admit: 2018-11-01 | Discharge: 2018-11-01 | Disposition: A | Discharge status: Home / Self Care | Payer: BLUE CROSS/BLUE SHIELD | Source: Ambulatory Visit | Attending: Orthopedic Surgery | Admitting: Orthopedic Surgery

## 2018-11-01 ENCOUNTER — Encounter (HOSPITAL_COMMUNITY)
Admission: RE | Admit: 2018-11-01 | Discharge: 2018-11-01 | Disposition: A | Discharge status: Home / Self Care | Payer: BLUE CROSS/BLUE SHIELD | Source: Ambulatory Visit | Attending: Orthopedic Surgery | Admitting: Orthopedic Surgery

## 2018-11-01 DIAGNOSIS — T84033A Mechanical loosening of internal left knee prosthetic joint, initial encounter: Secondary | ICD-10-CM | POA: Insufficient documentation

## 2018-11-01 MED ORDER — TECHNETIUM TC 99M MEDRONATE IV KIT
21.6000 | PACK | Freq: Once | INTRAVENOUS | Status: AC | PRN
  Administered 2018-11-01: 21.6 via INTRAVENOUS

## 2019-12-17 ENCOUNTER — Ambulatory Visit: Payer: BLUE CROSS/BLUE SHIELD

## 2021-06-05 ENCOUNTER — Encounter: Payer: Self-pay | Admitting: Internal Medicine

## 2022-01-01 ENCOUNTER — Telehealth: Payer: Self-pay | Admitting: *Deleted

## 2022-01-01 NOTE — Telephone Encounter (Signed)
We're about to send a request for medical records.  We need to know the legal name and address of this facility and how we can send a request.  Give me a call.  The call Reference [phone]." ? ?I left her a voicemail message of the needed information.   ?
